# Patient Record
Sex: Female | Born: 1935
Health system: Southern US, Community
[De-identification: ages and names within clinical notes are randomized; demographics above are authoritative.]

## PROBLEM LIST (undated history)

## (undated) DIAGNOSIS — E782 Mixed hyperlipidemia: Secondary | ICD-10-CM

## (undated) DIAGNOSIS — I10 Essential (primary) hypertension: Secondary | ICD-10-CM

## (undated) DIAGNOSIS — E7800 Pure hypercholesterolemia, unspecified: Secondary | ICD-10-CM

## (undated) DIAGNOSIS — R011 Cardiac murmur, unspecified: Secondary | ICD-10-CM

## (undated) DIAGNOSIS — T7840XA Allergy, unspecified, initial encounter: Secondary | ICD-10-CM

## (undated) DIAGNOSIS — R002 Palpitations: Secondary | ICD-10-CM

## (undated) DIAGNOSIS — J45909 Unspecified asthma, uncomplicated: Secondary | ICD-10-CM

## (undated) DIAGNOSIS — I4891 Unspecified atrial fibrillation: Secondary | ICD-10-CM

## (undated) HISTORY — DX: Essential (primary) hypertension: I10

## (undated) HISTORY — DX: Mixed hyperlipidemia: E78.2

## (undated) HISTORY — DX: Unspecified atrial fibrillation: I48.91

## (undated) HISTORY — PX: BREAST SURGERY: SHX581

## (undated) HISTORY — DX: Unspecified asthma, uncomplicated: J45.909

## (undated) HISTORY — DX: Cardiac murmur, unspecified: R01.1

## (undated) HISTORY — DX: Allergy, unspecified, initial encounter: T78.40XA

## (undated) HISTORY — DX: Palpitations: R00.2

## (undated) HISTORY — DX: Pure hypercholesterolemia, unspecified: E78.00

---

## 1998-07-15 ENCOUNTER — Ambulatory Visit (HOSPITAL_BASED_OUTPATIENT_CLINIC_OR_DEPARTMENT_OTHER): Admission: RE | Admit: 1998-07-15 | Discharge: 1998-07-15 | Payer: Self-pay | Admitting: Orthopedic Surgery

## 2002-12-28 ENCOUNTER — Inpatient Hospital Stay (HOSPITAL_COMMUNITY): Admission: EM | Admit: 2002-12-28 | Discharge: 2002-12-29 | Payer: Self-pay | Admitting: Emergency Medicine

## 2002-12-30 ENCOUNTER — Emergency Department (HOSPITAL_COMMUNITY): Admission: EM | Admit: 2002-12-30 | Discharge: 2002-12-30 | Payer: Self-pay | Admitting: Emergency Medicine

## 2003-03-23 ENCOUNTER — Emergency Department (HOSPITAL_COMMUNITY): Admission: EM | Admit: 2003-03-23 | Discharge: 2003-03-23 | Payer: Self-pay | Admitting: Emergency Medicine

## 2009-01-15 ENCOUNTER — Ambulatory Visit (HOSPITAL_BASED_OUTPATIENT_CLINIC_OR_DEPARTMENT_OTHER): Admission: RE | Admit: 2009-01-15 | Discharge: 2009-01-15 | Payer: Self-pay | Admitting: Surgery

## 2010-07-10 NOTE — Discharge Summary (Signed)
NAME:  Melissa Randolph, Melissa Randolph NO.:  [id number]   MEDICAL RECORD NO.:  [id number]                   PATIENT TYPE:  INP   LOCATION:  0340                                 FACILITY:  [facility]   PHYSICIAN:  Madaline Savage, M.D.             DATE OF BIRTH:  08/17/1934   DATE OF ADMISSION:  12/28/2002  DATE OF DISCHARGE:  12/29/2002                                 DISCHARGE SUMMARY   ADMISSION DIAGNOSES:  Atrial fibrillation, 4.27.35   CONDITION ON DISCHARGE:  Improved.   DISCHARGE MEDICATIONS:  The patient will continue her outpatient medications  which include:  1. Proventil metered dose inhaler 2 mg p.o. twice a day.  2. Flovent metered dose inhaler 88 mcg twice a day p.r.n.  3. Allegra unknown dose.  4. Aspirin 81 mg a day.  5. We are adding to that regimen at discharge potassium 10 mEq p.o. daily.   DISPOSITION:  The patient will be following up with Dr. Aggie Cosier and will  call his office this upcoming week for an appointment. Pending at the time  of this discharge is the patient's TSH level which was drawn on admission  and not available at the time of discharge.   HISTORY OF PRESENT ILLNESS:  The patient is a delightful 76 year old white  married female patient of Dr. Lucas Mallow who has no history of any cardiac  disease in the past. She did have an echocardiogram because of having been  told of a heart murmur but she thinks it was basically negative.   The patient developed rapid heart action at 9 p.m. on the evening of  admission that occurred while she was seated and reading a book. She did  have chest pain with it, shortness of breath, dizziness or near syncope. She  did decide to come to the [facility] Emergency Room because she lives  nearby. She was found to be in rapid atrial fibrillation at that time and  was seen by Dr. Sampson Goon and admitted.   PAST MEDICAL HISTORY:  Shows the patient has a history of allergy and may or  may not have  asthma. She has a history of PVC's, she has a deviated septum  and has seen an ear, nose throat specialist in the past. She has had a left  breast fibroid and a remote tonsillectomy, adenoidectomy. She has  intolerance to statin drugs which causes weakness, Zithromax causes  laryngeal edema, contrast dye causes squeezing and requires adrenaline  administration.   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 Rigoberto Noel attends that church. The patient is without  tobacco or alcohol use.   FAMILY HISTORY:  Positive for prostate cancer.   REVIEW OF SYMPTOMS:  Essentially negative other than stated above.   PHYSICAL EXAMINATION ON DISCHARGE:  VITAL SIGNS:  Temperature was 98.0,  pulse 72, respirations  20, blood pressure 149/83.  GENERAL:  The patient is small framed and thin.  HEENT:  She has no carotid bruits or thyromegaly. No adenopathy.  LUNGS:  Entirely clear.  CARDIAC:  I heard a soft systolic murmur at the lower left sternal edge  without gallops. There were do diastolic murmurs.   Although I did not examine the following systems, Dr. Domingo Sep reports from  the previous evening that the abdomen was soft and bowel sounds were present  and there was no peripheral edema with 2+ pedal pulses.   EKG on November 6 at 4:01 a.m. showed sinus rhythm at a rate of 65/minute  with a slightly rightward axis and was otherwise unremarkable.   LABORATORY DATA:  Laboratory data during hospitalization showed the initial  potassium to be 3.0 and after potassium supplementation it was 3.7.  Hemoglobin 14.9, hematocrit 44%, platelet count 309,000, white blood cell  count 8800, normal differential. The patient's TSH at the time of this  dictation is still pending. CK isoenzymes showed CK of 122, MB of 2.4,  relative index 2.0 and troponin 0.01. Second set was 1.152 CK, MB of 3.2,  relative index 2.1 and troponin 0.01.   HOSPITAL COURSE:  The  patient was admitted to telemetry and placed heparin.  The patient slept well and had no complaints. Some time shortly after she  arrived either in the emergency room or later in the evening, she went into  sinus rhythm and has remained free of any further palpitations. The  telemetry since midnight has shown normal sinus rhythm.   Because of the patient's recent echo which was allegedly normal, her  negative cardiac enzymes and her current use of aspirin, we will discharge  her at this time to followup with Dr. Lucas Mallow in the office early next week.  Her TSH will need to be checked, a repeat potassium will be checked and we  did give her 10 mEq of potassium chloride to take at discharge. The patient  made me aware that she had read reports of low potassium with the use of  Proventil and Epocrates does list hypokalemia as a side effect of Proventil  use, so 10 mEq of potassium was given on discharge.                                               Madaline Savage, M.D.    WHG/MEDQ  D:  12/29/2002  T:  12/29/2002  Job:  [id number]   cc:   Jaclyn Prime. Lucas Mallow, M.D.  [address]  [place]  [place] [address]  Fax: [phone]   T. Huston Foley  [address] [place]  [place] [address]  Fax: [phone]

## 2010-07-14 ENCOUNTER — Ambulatory Visit (HOSPITAL_BASED_OUTPATIENT_CLINIC_OR_DEPARTMENT_OTHER)
Admission: RE | Admit: 2010-07-14 | Discharge: 2010-07-14 | Disposition: A | Discharge status: Home / Self Care | Payer: Medicare Other | Source: Ambulatory Visit | Attending: Orthopedic Surgery | Admitting: Orthopedic Surgery

## 2010-07-14 DIAGNOSIS — M65849 Other synovitis and tenosynovitis, unspecified hand: Secondary | ICD-10-CM | POA: Insufficient documentation

## 2010-07-14 DIAGNOSIS — M65839 Other synovitis and tenosynovitis, unspecified forearm: Secondary | ICD-10-CM | POA: Insufficient documentation

## 2010-07-17 NOTE — Op Note (Signed)
  NAME:  BROOKELLE, PELLICANE NO.:  [id number]  MEDICAL RECORD NO.:  [id number]           PATIENT TYPE:  LOCATION:                                 FACILITY:  PHYSICIAN:  Katy Fitch. Oluwaferanmi Wain, M.D.      DATE OF BIRTH:  DATE OF PROCEDURE:  07/14/2010 DATE OF DISCHARGE:                              OPERATIVE REPORT   PREOPERATIVE DIAGNOSIS:  Locking stenosing tenosynovitis, right thumb at A1 pulley failing nonoperative measures.  POSTOPERATIVE DIAGNOSIS:  Locking stenosing tenosynovitis, right thumb at A1 pulley failing nonoperative measures.  OPERATION:  Release of right thumb A1 pulley.  OPERATING SURGEON:  Katy Fitch. Gwenetta Devos, MD  ASSISTANT:  Marveen Reeks Dasnoit, PA-C  ANESTHESIA:  2% lidocaine, metacarpal head level block.  This was performed as a minor operating room procedure.  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  She is status post treatment of a similar predicament of left thumb years back.  She has failed nonoperative measures including splinting, activity modification, and injection.  Due to failure to response to nonoperative measures, she is brought to the operating room at this time for release of her right thumb A1 pulley.  Preoperatively, Ms. Graciano reported that she did not want postoperative pain medication.  She will use over-the-counter analgesics postoperatively.  PROCEDURE IN DETAIL:  Jaiden Wahab was brought to room 8 of the [facility] and placed in supine position upon the operating table. Following Betadine prep of her right hand, 2% lidocaine was infiltrated at the metacarpal head level to obtain a digital block.  After few moments, excellent anesthesia was achieved.  Procedure commenced with Betadine scrub and paint of the right arm followed by placement of  impervious sterile towels.  After informed consent and a routine surgical time-out, the arm was exsanguinated by direct compression and an arterial tourniquet on the proximal brachium inflated to 220 mmHg.  A short transverse incision was fashioned directly over the palpably thickened A1 pulley.  Subcutaneous tissues were carefully divided taking care to identify the radial proper digital nerve which was gently retracted with a Ragnell retractor.  The A1 pulley was thickened to a walled thickness of approximately 2.5 mm.  This was incised with scalpel and released with scissors.  Thereafter, free range of motion of the IP joint was recovered.  The tendons were delivered and found to have a small swelling/nodule which should resolve over time.  The wound was then repaired with trauma suture of 5-0 nylon x2.  A compressive dressing was applied with Xeroflo sterile gauze and Ace wrap.  Ms. Jolin was advised to remove her Ace bandage in 48 hours and to begin using Band-Aids.  Short-term followup in 8 days for suture removal.   Katy Fitch. Sylvi Rybolt, M.D.     RVS/MEDQ  D:  07/14/2010  T:  07/15/2010  Job:  [id number]  Electronically Signed by Josephine Igo M.D. on 07/17/2010 08:14:39 AM

## 2012-02-21 ENCOUNTER — Other Ambulatory Visit: Payer: Self-pay | Admitting: Dermatology

## 2012-11-29 ENCOUNTER — Other Ambulatory Visit: Payer: Self-pay | Admitting: Interventional Cardiology

## 2012-11-29 ENCOUNTER — Telehealth: Payer: Self-pay | Admitting: Interventional Cardiology

## 2012-11-29 NOTE — Telephone Encounter (Signed)
New problem:  Pt states she wants to know if she can have more samples of Crestor 10 mg

## 2012-11-30 NOTE — Telephone Encounter (Signed)
We are out of samples and pt will call back next week to check.

## 2012-12-06 ENCOUNTER — Encounter: Payer: Self-pay | Admitting: Interventional Cardiology

## 2012-12-06 ENCOUNTER — Encounter: Payer: Self-pay | Admitting: *Deleted

## 2012-12-07 ENCOUNTER — Telehealth: Payer: Self-pay | Admitting: *Deleted

## 2012-12-07 NOTE — Telephone Encounter (Signed)
Samples left at front desk for pick up. Patient aware.

## 2012-12-07 NOTE — Telephone Encounter (Signed)
Yes, if we have them please supply pt with samples.

## 2012-12-11 ENCOUNTER — Ambulatory Visit (INDEPENDENT_AMBULATORY_CARE_PROVIDER_SITE_OTHER): Payer: Medicare Other | Admitting: Interventional Cardiology

## 2012-12-11 ENCOUNTER — Encounter: Payer: Self-pay | Admitting: Interventional Cardiology

## 2012-12-11 VITALS — BP 158/56 | HR 68 | Ht 64.5 in | Wt 120.0 lb

## 2012-12-11 DIAGNOSIS — E782 Mixed hyperlipidemia: Secondary | ICD-10-CM | POA: Insufficient documentation

## 2012-12-11 DIAGNOSIS — I10 Essential (primary) hypertension: Secondary | ICD-10-CM | POA: Insufficient documentation

## 2012-12-11 DIAGNOSIS — I4891 Unspecified atrial fibrillation: Secondary | ICD-10-CM

## 2012-12-11 DIAGNOSIS — I4589 Other specified conduction disorders: Secondary | ICD-10-CM

## 2012-12-11 MED ORDER — DILTIAZEM HCL ER COATED BEADS 180 MG PO CP24: 180.0000 mg | ORAL_CAPSULE | Freq: Every day | ORAL | Status: DC

## 2012-12-11 MED ORDER — DIGOXIN 250 MCG PO TABS: 0.2500 mg | ORAL_TABLET | Freq: Every day | ORAL | Status: DC

## 2012-12-11 NOTE — Progress Notes (Signed)
Patient ID: Melissa Randolph, female   DOB: [birth year], 78 y.o.   MRN: [id number]    [address] [place], [place]  [address] Phone: [phone] Fax:  [phone]  Date:  12/11/2012   ID:  Melissa Randolph, DOB February 24, 1934, MRN [id number]  PCP:  No primary provider on file.      History of Present Illness: Melissa Randolph is a 78 y.o. female who has had multiple atrial arrhythmias including atrial fibrillation. Sx have ben controlled on [place]. She has had some palpitations during stressful days, while wearing monitor, as she was not taking th eusual extra diltiazem. She had AFib 3% of the time. When She premedicated with some extra diltiazem before the stress which was related to performing in church, she avoided the AFib sx. Atrial Fibrillation F/U:  Denies : Chest pain.  Dizziness.  Leg edema.  Orthopnea.  Palpitations.  Shortness of breath.  Syncope.  She did aerobic and strength training, yoga classes.    Wt Readings from Last 3 Encounters:  12/11/12 120 lb (54.432 kg)     Past Medical History  Diagnosis Date  . Hypercholesteremia   . HTN (hypertension)   . Atrial fibrillation   . Mixed hyperlipidemia   . Palpitation     Current Outpatient Prescriptions  Medication Sig Dispense Refill  . Ascorbic Acid (VITAMIN C PO) Take 1 tablet by mouth daily.      Marland Kitchen aspirin 81 MG tablet Take 81 mg by mouth daily.      . calcium carbonate (OS-CAL - DOSED IN MG OF ELEMENTAL CALCIUM) 1250 MG tablet Take 1 tablet by mouth.      . CARTIA XT 180 MG 24 hr capsule TAKE 1 CAPSULE BY MOUTH TWICE DAILY  60 capsule  0  . cholecalciferol (VITAMIN D) 400 UNITS TABS tablet Take 1,000 Units by mouth.      . co-enzyme Q-10 30 MG capsule Take 30 mg by mouth 3 (three) times daily.      . digoxin (LANOXIN) 0.25 MG tablet Take 0.25 mg by mouth daily.      Marland Kitchen diltiazem (CARDIZEM) 60 MG tablet Take 60 mg by mouth 4 (four) times daily.      . Fish Oil OIL 480 mg by Does not apply  route.      . fluticasone (FLONASE) 50 MCG/ACT nasal spray Place 2 sprays into the nose daily.      . Potassium Gluconate 550 MG TABS Take 1 tablet by mouth daily.      Marland Kitchen azelastine (ASTELIN) 137 MCG/SPRAY nasal spray       . fluticasone (FLOVENT HFA) 44 MCG/ACT inhaler Inhale 1 puff into the lungs 2 (two) times daily.      Marland Kitchen FLUVIRIN INJ injection        No current facility-administered medications for this visit.    Allergies:    Allergies  Allergen Reactions  . Ivp Dye [Iodinated Diagnostic Agents]   . Polycin [Bacitracin-Polymyxin B]   . Zithromax [Azithromycin]     Social History:  The patient  reports that she has never smoked. She does not have any smokeless tobacco history on file.   Family History:  The patient's family history is not on file.   ROS:  Please see the history of present illness.  No nausea, vomiting.  No fevers, chills.  No focal weakness.  No dysuria. Managing her sx from PACs.  All other systems reviewed and negative.   PHYSICAL  EXAM: VS:  BP 158/56  Pulse 68  Ht 5' 4.5" (1.638 m)  Wt 120 lb (54.432 kg)  BMI 20.29 kg/m2  SpO2 99% Well nourished, well developed, in no acute distress HEENT: normal Neck: no JVD, no carotid bruits Cardiac:  normal S1, S2; RRR; 2/6 early systolic murmur Lungs:  clear to auscultation bilaterally, no wheezing, rhonchi or rales Abd: soft, nontender, no hepatomegaly Ext: no edema Skin: warm and dry Neuro:   no focal abnormalities noted  EKG:  NSR, IRBBB, NSST    ASSESSMENT AND PLAN:  Atrial fibrillation  Continue Aspirin 162 mg,  orally, twice daily Continue Cardizem CD Capsule Extended Release 24 Hour, 180 MG, 1 capsule, Orally, twice a day, 60, Refills 11 Continue Lanoxin Tablet, 0.25 MG, 1 tablet, Orally, Once a day, 30, Refills 11 LAB: Basic Metabolic (Ordered for 12/06/2011)   GLUCOSE 85 70-99 - mg/dL   BUN 14 1-61 - mg/dL   CREATININE 0.96 0.45-4.09 - mg/dl   eGFR (NON-AFRICAN AMERICAN) 69 >60 - calc   eGFR  (AFRICAN AMERICAN) 83 >60 - calc   SODIUM 143 136-145 - mmol/L   POTASSIUM 4.1 3.5-5.5 - mmol/L   CHLORIDE 106 98-107 - mmol/L   C02 30 22-32 - mmol/L   ANION GAP 10.7 6.0-20.0 - mmol/L   CALCIUM 9.5 8.6-10.3 - mg/dL    Kayliegh Boyers,JAY 81/19/1478 03:59:50 PM > normal Harward,Amy 12/08/2011 04:02:35 PM > lmom per dpr.   Currently NSR.  AFib 3% of the time by monitor in 2013. we again discussed anticoagulation with Coumadin. She feels that with her active lifestyle,her risk of bleeding is greater then her risk of stroke. She will continue to self medicate with diltiazem to avoid the episodic atrial fibrillation.  Check labs when she is fasting.  BP ontrolled aoutside of MDs office.   2. Mixed hyperlipidemia  Continue Crestor Tablet, 10 MG, 1 tablet, Orally, three times a week LAB: Statin Panel (Ordered for 12/06/2011)   ALP 64 38-126 - U/L   ALT 18 0-52 - U/L   AST 19 0-39 - U/L   CHOLESTEROL 183 <200 - mg/dL   TRIG 295 6-213 - mg/dL   DLDL 086 5-78 - mg/dL H  NON-HDL 469 6-295 - mg/dL   DIRECT HDL 54 28-41 - mg/dL   CHOL/HDL 3.4 3.2-4.4 - Ratio    Sarah Baez,JAY 12/08/2011 04:00:13 PM > lipids well controlled. Harward,Amy 12/08/2011 04:02:55 PM > lmom per dpr.   Lipids much better. Using CoQ10 to help with Crestor. LDL 82.    3. Palpitations  Continue Diltiazem HCl Tablet, 60 MG, 1 tablet, Orally, as needed for palpitations, 15, Refills 11 Infrequent. Use aditional diltiazem as needed.    Follow Up  1 Year (Reason: paroxysmal atrial fibrillation)     Signed, Fredric Mare, MD, [facility] 12/11/2012 9:22 AM

## 2012-12-11 NOTE — Patient Instructions (Addendum)
Your physician wants you to follow-up in: 1 year with Dr. Eldridge Dace. You will receive a reminder letter in the mail two months in advance. If you don't receive a letter, please call our office to schedule the follow-up appointment.  Your physician recommends that you continue on your current medications as directed. Please refer to the Current Medication list given to you today.  Your physician recommends that you return for lab work on 12-13-12 fasting for Bmet, ALT and Lipid.  Your Cartia XT and digoxin have been refilled.

## 2012-12-13 ENCOUNTER — Other Ambulatory Visit: Payer: Self-pay

## 2012-12-13 ENCOUNTER — Other Ambulatory Visit (INDEPENDENT_AMBULATORY_CARE_PROVIDER_SITE_OTHER): Payer: Medicare Other

## 2012-12-13 DIAGNOSIS — E782 Mixed hyperlipidemia: Secondary | ICD-10-CM

## 2012-12-13 LAB — LIPID PANEL
Cholesterol: 189 mg/dL (ref 0–200)
HDL: 66.8 mg/dL (ref >39.00)
LDL Cholesterol: 104 mg/dL — ABNORMAL HIGH (ref 0–99)
Total CHOL/HDL Ratio: 3
Triglycerides: 92 mg/dL (ref 0.0–149.0)
VLDL: 18.4 mg/dL (ref 0.0–40.0)

## 2012-12-13 LAB — BASIC METABOLIC PANEL
BUN: 13 mg/dL (ref 6–23)
CO2: 30 mEq/L (ref 19–32)
Calcium: 9.2 mg/dL (ref 8.4–10.5)
Chloride: 105 mEq/L (ref 96–112)
Creatinine, Ser: 0.8 mg/dL (ref 0.4–1.2)
GFR: 77.48 mL/min (ref >60.00)
Glucose, Bld: 94 mg/dL (ref 70–99)
Potassium: 3.9 mEq/L (ref 3.5–5.1)
Sodium: 142 mEq/L (ref 135–145)

## 2012-12-13 LAB — ALT: ALT: 22 U/L (ref 0–35)

## 2012-12-13 MED ORDER — DILTIAZEM HCL ER COATED BEADS 180 MG PO CP24: 180.0000 mg | ORAL_CAPSULE | Freq: Two times a day (BID) | ORAL | Status: DC

## 2012-12-14 ENCOUNTER — Encounter: Payer: Self-pay | Admitting: Cardiology

## 2012-12-15 ENCOUNTER — Telehealth: Payer: Self-pay | Admitting: Interventional Cardiology

## 2012-12-15 NOTE — Telephone Encounter (Signed)
New problem     Pt needs a call back about her lab results and she may need a copy   Thanks!

## 2012-12-15 NOTE — Telephone Encounter (Signed)
Pt notified I mailed all labwork to her yesterday.

## 2013-02-08 ENCOUNTER — Telehealth: Payer: Self-pay | Admitting: *Deleted

## 2013-02-08 NOTE — Telephone Encounter (Signed)
3 week samples crestor 10 mg approved by Gaynell Face

## 2013-03-30 ENCOUNTER — Telehealth: Payer: Self-pay

## 2013-03-30 NOTE — Telephone Encounter (Signed)
Patient called wanting samples of crestor 10 mg placed samples up front

## 2013-04-25 ENCOUNTER — Other Ambulatory Visit: Payer: Self-pay | Admitting: Interventional Cardiology

## 2013-06-30 ENCOUNTER — Ambulatory Visit (INDEPENDENT_AMBULATORY_CARE_PROVIDER_SITE_OTHER): Payer: Medicare Other | Admitting: Internal Medicine

## 2013-06-30 VITALS — BP 130/70 | HR 75 | Temp 98.8°F | Resp 12 | Ht 64.0 in | Wt 121.0 lb

## 2013-06-30 DIAGNOSIS — J40 Bronchitis, not specified as acute or chronic: Secondary | ICD-10-CM

## 2013-06-30 DIAGNOSIS — J9801 Acute bronchospasm: Secondary | ICD-10-CM

## 2013-06-30 MED ORDER — DOXYCYCLINE HYCLATE 100 MG PO TABS: 100.0000 mg | ORAL_TABLET | Freq: Two times a day (BID) | ORAL | Status: DC

## 2013-06-30 MED ORDER — PREDNISONE 10 MG PO TABS: ORAL_TABLET | ORAL | Status: DC

## 2013-06-30 NOTE — Patient Instructions (Signed)
Bronchitis Bronchitis is inflammation of the airways that extend from the windpipe into the lungs (bronchi). The inflammation often causes mucus to develop, which leads to a cough. If the inflammation becomes severe, it may cause shortness of breath. CAUSES  Bronchitis may be caused by:   Viral infections.   Bacteria.   Cigarette smoke.   Allergens, pollutants, and other irritants.  SIGNS AND SYMPTOMS  The most common symptom of bronchitis is a frequent cough that produces mucus. Other symptoms include:  Fever.   Body aches.   Chest congestion.   Chills.   Shortness of breath.   Sore throat.  DIAGNOSIS  Bronchitis is usually diagnosed through a medical history and physical exam. Tests, such as chest X-rays, are sometimes done to rule out other conditions.  TREATMENT  You may need to avoid contact with whatever caused the problem (smoking, for example). Medicines are sometimes needed. These may include:  Antibiotics. These may be prescribed if the condition is caused by bacteria.  Cough suppressants. These may be prescribed for relief of cough symptoms.   Inhaled medicines. These may be prescribed to help open your airways and make it easier for you to breathe.   Steroid medicines. These may be prescribed for those with recurrent (chronic) bronchitis. HOME CARE INSTRUCTIONS  Get plenty of rest.   Drink enough fluids to keep your urine clear or pale yellow (unless you have a medical condition that requires fluid restriction). Increasing fluids may help thin your secretions and will prevent dehydration.   Only take over-the-counter or prescription medicines as directed by your health care provider.  Only take antibiotics as directed. Make sure you finish them even if you start to feel better.  Avoid secondhand smoke, irritating chemicals, and strong fumes. These will make bronchitis worse. If you are a smoker, quit smoking. Consider using nicotine gum or  skin patches to help control withdrawal symptoms. Quitting smoking will help your lungs heal faster.   Put a cool-mist humidifier in your bedroom at night to moisten the air. This may help loosen mucus. Change the water in the humidifier daily. You can also run the hot water in your shower and sit in the bathroom with the door closed for 5 10 minutes.   Follow up with your health care provider as directed.   Wash your hands frequently to avoid catching bronchitis again or spreading an infection to others.  SEEK MEDICAL CARE IF: Your symptoms do not improve after 1 week of treatment.  SEEK IMMEDIATE MEDICAL CARE IF:  Your fever increases.  You have chills.   You have chest pain.   You have worsening shortness of breath.   You have bloody sputum.  You faint.  You have lightheadedness.  You have a severe headache.   You vomit repeatedly. MAKE SURE YOU:   Understand these instructions.  Will watch your condition.  Will get help right away if you are not doing well or get worse. Document Released: 02/08/2005 Document Revised: 11/29/2012 Document Reviewed: 10/03/2012 [facility] Patient Information 2014 [place]. Bronchospasm, Adult A bronchospasm is a spasm or tightening of the airways going into the lungs. During a bronchospasm breathing becomes more difficult because the airways get smaller. When this happens there can be coughing, a whistling sound when breathing (wheezing), and difficulty breathing. Bronchospasm is often associated with asthma, but not all patients who experience a bronchospasm have asthma. CAUSES  A bronchospasm is caused by inflammation or irritation of the airways. The inflammation or irritation  may be triggered by:   Allergies (such as to animals, pollen, food, or mold). Allergens that cause bronchospasm may cause wheezing immediately after exposure or many hours later.   Infection. Viral infections are believed to be the most common  cause of bronchospasm.   Exercise.   Irritants (such as pollution, cigarette smoke, strong odors, aerosol sprays, and paint fumes).   Weather changes. Winds increase molds and pollens in the air. Rain refreshes the air by washing irritants out. Cold air may cause inflammation.   Stress and emotional upset.  SIGNS AND SYMPTOMS   Wheezing.   Excessive nighttime coughing.   Frequent or severe coughing with a simple cold.   Chest tightness.   Shortness of breath.  DIAGNOSIS  Bronchospasm is usually diagnosed through a history and physical exam. Tests, such as chest X-rays, are sometimes done to look for other conditions. TREATMENT   Inhaled medicines can be given to open up your airways and help you breathe. The medicines can be given using either an inhaler or a nebulizer machine.  Corticosteroid medicines may be given for severe bronchospasm, usually when it is associated with asthma. HOME CARE INSTRUCTIONS   Always have a plan prepared for seeking medical care. Know when to call your health care provider and local emergency services (911 in the [place] Know where you can access local emergency care.  Only take medicines as directed by your health care provider.  If you were prescribed an inhaler or nebulizer machine, ask your health care provider to explain how to use it correctly. Always use a spacer with your inhaler if you were given one.  It is necessary to remain calm during an attack. Try to relax and breathe more slowly.  Control your home environment in the following ways:   Change your heating and air conditioning filter at least once a month.   Limit your use of fireplaces and wood stoves.  Do not smoke and do not allow smoking in your home.   Avoid exposure to perfumes and fragrances.   Get rid of pests (such as roaches and mice) and their droppings.   Throw away plants if you see mold on them.   Keep your house clean and dust free.    Replace carpet with wood, tile, or vinyl flooring. Carpet can trap dander and dust.   Use allergy-proof pillows, mattress covers, and box spring covers.   Wash bed sheets and blankets every week in hot water and dry them in a dryer.   Use blankets that are made of polyester or cotton.   Wash hands frequently. SEEK MEDICAL CARE IF:   You have muscle aches.   You have chest pain.   The sputum changes from clear or white to yellow, green, gray, or bloody.   The sputum you cough up gets thicker.   There are problems that may be related to the medicine you are given, such as a rash, itching, swelling, or trouble breathing.  SEEK IMMEDIATE MEDICAL CARE IF:   You have worsening wheezing and coughing even after taking your prescribed medicines.   You have increased difficulty breathing.   You develop severe chest pain. MAKE SURE YOU:   Understand these instructions.  Will watch your condition.  Will get help right away if you are not doing well or get worse. Document Released: 02/11/2003 Document Revised: 10/11/2012 Document Reviewed: 07/31/2012 [facility] Patient Information 2014 [place].

## 2013-06-30 NOTE — Progress Notes (Signed)
   Subjective:    Patient ID: Melissa Randolph, female    DOB: 02-15-1935, 79 y.o.   MRN: [id number]  HPI 79 year old female complains of nasal yellow discharge, cough, congestion and low grade fevers. Has been sick for 3 days.   Has tried robutussion  and it has loosened up her mucus but she is still unable to cough anything up. Had a little wheezing last night but when coughing it helped clear Cough and chest congestion the worst part. No sob, no cp. Prednisone works well for her  Review of Systems     Objective:   Physical Exam  Vitals reviewed. Constitutional: She is oriented to person, place, and time. She appears well-developed and well-nourished. No distress.  HENT:  Head: Normocephalic.  Right Ear: External ear normal.  Left Ear: External ear normal.  Nose: Mucosal edema and rhinorrhea present.  Mouth/Throat: Oropharynx is clear and moist.  Eyes: EOM are normal. Pupils are equal, round, and reactive to light.  Neck: Normal range of motion. Neck supple. No tracheal deviation present.  Cardiovascular: Normal rate and normal heart sounds.   Pulmonary/Chest: Effort normal. Not tachypneic. No respiratory distress. She has decreased breath sounds. She has wheezes. She has rhonchi. She has no rales. She exhibits no tenderness.  Musculoskeletal: Normal range of motion.  Lymphadenopathy:    She has no cervical adenopathy.  Neurological: She is alert and oriented to person, place, and time. She exhibits normal muscle tone. Coordination normal.  Psychiatric: She has a normal mood and affect. Her behavior is normal.          Assessment & Plan:  Bronchitis Doxycycline/Prednisone

## 2013-07-09 ENCOUNTER — Other Ambulatory Visit: Payer: Self-pay | Admitting: Interventional Cardiology

## 2013-07-10 NOTE — Telephone Encounter (Signed)
Does the patient take this prn as requested? Please advise. Thanks, MI

## 2013-08-10 ENCOUNTER — Other Ambulatory Visit: Payer: Self-pay

## 2013-08-10 MED ORDER — ROSUVASTATIN CALCIUM 10 MG PO TABS: 10.0000 mg | ORAL_TABLET | ORAL | Status: DC

## 2013-08-14 ENCOUNTER — Telehealth: Payer: Self-pay

## 2013-08-14 NOTE — Telephone Encounter (Signed)
Patient called for samples of crestor placed them up front

## 2013-10-17 ENCOUNTER — Telehealth: Payer: Self-pay | Admitting: *Deleted

## 2013-10-17 NOTE — Telephone Encounter (Signed)
Crestor samples placed at the front desk for pick up. 

## 2013-12-04 ENCOUNTER — Telehealth: Payer: Self-pay

## 2013-12-04 NOTE — Telephone Encounter (Signed)
Patient called wanting samples of crestor 10mg  placed a month supply up front

## 2013-12-13 ENCOUNTER — Other Ambulatory Visit (INDEPENDENT_AMBULATORY_CARE_PROVIDER_SITE_OTHER): Payer: Medicare Other | Admitting: *Deleted

## 2013-12-13 DIAGNOSIS — I10 Essential (primary) hypertension: Secondary | ICD-10-CM

## 2013-12-13 DIAGNOSIS — E782 Mixed hyperlipidemia: Secondary | ICD-10-CM

## 2013-12-13 LAB — LIPID PANEL
Cholesterol: 199 mg/dL (ref 0–200)
HDL: 59.8 mg/dL (ref >39.00)
LDL Cholesterol: 106 mg/dL — ABNORMAL HIGH (ref 0–99)
NonHDL: 139.2
Total CHOL/HDL Ratio: 3
Triglycerides: 167 mg/dL — ABNORMAL HIGH (ref 0.0–149.0)
VLDL: 33.4 mg/dL (ref 0.0–40.0)

## 2013-12-13 LAB — ALT: ALT: 21 U/L (ref 0–35)

## 2013-12-14 ENCOUNTER — Other Ambulatory Visit: Payer: Self-pay

## 2013-12-14 DIAGNOSIS — Z79899 Other long term (current) drug therapy: Secondary | ICD-10-CM

## 2013-12-14 DIAGNOSIS — E785 Hyperlipidemia, unspecified: Secondary | ICD-10-CM

## 2013-12-19 ENCOUNTER — Ambulatory Visit (INDEPENDENT_AMBULATORY_CARE_PROVIDER_SITE_OTHER): Payer: Medicare Other | Admitting: Interventional Cardiology

## 2013-12-19 ENCOUNTER — Encounter: Payer: Self-pay | Admitting: Interventional Cardiology

## 2013-12-19 VITALS — BP 148/70 | HR 60 | Ht 64.5 in | Wt 120.1 lb

## 2013-12-19 DIAGNOSIS — E782 Mixed hyperlipidemia: Secondary | ICD-10-CM

## 2013-12-19 DIAGNOSIS — I480 Paroxysmal atrial fibrillation: Secondary | ICD-10-CM

## 2013-12-19 DIAGNOSIS — I10 Essential (primary) hypertension: Secondary | ICD-10-CM

## 2013-12-19 DIAGNOSIS — R002 Palpitations: Secondary | ICD-10-CM

## 2013-12-19 LAB — BASIC METABOLIC PANEL
BUN: 15 mg/dL (ref 6–23)
CO2: 27 mEq/L (ref 19–32)
Calcium: 9.5 mg/dL (ref 8.4–10.5)
Chloride: 104 mEq/L (ref 96–112)
Creatinine, Ser: 0.8 mg/dL (ref 0.4–1.2)
GFR: 69.89 mL/min (ref >60.00)
Glucose, Bld: 94 mg/dL (ref 70–99)
Potassium: 3.8 mEq/L (ref 3.5–5.1)
Sodium: 141 mEq/L (ref 135–145)

## 2013-12-19 MED ORDER — DILTIAZEM HCL 60 MG PO TABS: ORAL_TABLET | ORAL | Status: DC

## 2013-12-19 MED ORDER — DILTIAZEM HCL ER COATED BEADS 180 MG PO CP24: 180.0000 mg | ORAL_CAPSULE | Freq: Two times a day (BID) | ORAL | Status: DC

## 2013-12-19 MED ORDER — DIGOXIN 250 MCG PO TABS: 0.2500 mg | ORAL_TABLET | Freq: Every day | ORAL | Status: DC

## 2013-12-19 NOTE — Patient Instructions (Signed)
Your physician recommends that you have lab work today: digoxin level/ BMP  Your physician recommends that you continue on your current medications as directed. Please refer to the Current Medication list given to you today.  Your physician wants you to follow-up in: 1 year with Dr. Irish Lack. You will receive a reminder letter in the mail two months in advance. If you don't receive a letter, please call our office to schedule the follow-up appointment.

## 2013-12-19 NOTE — Progress Notes (Signed)
Patient ID: Melissa Randolph, female   DOB: Sep 22, 1934, 79 y.o.   MRN: [id number] Patient ID: Melissa Randolph, female   DOB: [birth year], 79 y.o.   MRN: [id number]    [place] [place], [place]  [address] Phone: [phone] Fax:  [phone]  Date:  12/19/2013   ID:  Melissa Randolph, DOB 02-Jun-1934, MRN [id number]  PCP:  Jani Gravel, MD      History of Present Illness: Melissa Randolph is a 79 y.o. female who has had multiple atrial arrhythmias including atrial fibrillation. Sx have ben controlled on [place]. She has had some palpitations during stressful days, while wearing monitor, as she was not taking th eusual extra diltiazem. She had AFib 3% of the time. When She premedicated with some extra diltiazem before the stress which was related to performing in church, she avoided the AFib sx. Atrial Fibrillation F/U:  Denies : Chest pain.  Dizziness.  Leg edema.  Orthopnea.  Palpitations.  Shortness of breath.  Syncope.  She did aerobic and strength training, yoga classes.  She has only had one episode of plapitations in the last 6 months.  It was gone within an hour of taking an extra 60 mg diltiazem.  Home BP readings are in the 120s /80s range.   Wt Readings from Last 3 Encounters:  12/19/13 120 lb 1.9 oz (54.486 kg)  06/30/13 121 lb (54.885 kg)  12/11/12 120 lb (54.432 kg)     Past Medical History  Diagnosis Date  . Hypercholesteremia   . HTN (hypertension)   . Atrial fibrillation   . Mixed hyperlipidemia   . Palpitation   . Allergy   . Asthma   . Heart murmur     Current Outpatient Prescriptions  Medication Sig Dispense Refill  . Ascorbic Acid (VITAMIN C PO) Take 1 tablet by mouth daily. 400 mg      . aspirin 81 MG tablet Take 162 mg by mouth 2 (two) times daily.       Marland Kitchen azelastine (ASTELIN) 137 MCG/SPRAY nasal spray       . calcium carbonate (OS-CAL - DOSED IN MG OF ELEMENTAL CALCIUM) 1250 MG tablet Take 1 tablet by mouth. $RemoveB'600mg'Qcuhbycm$  daily        . cholecalciferol (VITAMIN D) 400 UNITS TABS tablet Take 1,000 Units by mouth.      . co-enzyme Q-10 30 MG capsule Take 200 mg by mouth at bedtime. 100 mg tablets      . digoxin (LANOXIN) 0.25 MG tablet Take 1 tablet (0.25 mg total) by mouth daily.  90 tablet  3  . diltiazem (CARDIZEM) 60 MG tablet TAKE TABLET BY MOUTH EVERY DAY AS NEEDED  15 tablet  3  . diltiazem (CARTIA XT) 180 MG 24 hr capsule Take 1 capsule (180 mg total) by mouth 2 (two) times daily.  180 capsule  3  . Fish Oil OIL 480 mg by Does not apply route.      . fluticasone (FLONASE) 50 MCG/ACT nasal spray Place 2 sprays into the nose daily.      . fluticasone (FLOVENT HFA) 44 MCG/ACT inhaler Inhale 1 puff into the lungs 2 (two) times daily.      Marland Kitchen FLUVIRIN INJ injection       . Potassium Gluconate 550 MG TABS Take 1 tablet by mouth daily.      . predniSONE (DELTASONE) 10 MG tablet 6-5-4-3-2-1 po pc for breathing  21 tablet  0  . rosuvastatin (  CRESTOR) 10 MG tablet Take 1 tablet (10 mg total) by mouth 3 (three) times a week. Pt takes Monday, Wednesday, Friday only  30 tablet  3   No current facility-administered medications for this visit.    Allergies:    Allergies  Allergen Reactions  . Ivp Dye [Iodinated Diagnostic Agents]   . Polycin [Bacitracin-Polymyxin B]   . Zithromax [Azithromycin]     Social History:  The patient  reports that she has never smoked. She has never used smokeless tobacco.   Family History:  The patient's family history includes Cancer in her father; Hyperlipidemia in her mother.   ROS:  Please see the history of present illness.  No nausea, vomiting.  No fevers, chills.  No focal weakness.  No dysuria. Managing her sx from PACs.  All other systems reviewed and negative.   PHYSICAL EXAM: VS:  BP 148/70  Pulse 60  Ht 5' 4.5" (1.638 m)  Wt 120 lb 1.9 oz (54.486 kg)  BMI 20.31 kg/m2 Well nourished, well developed, in no acute distress HEENT: normal Neck: no JVD, no carotid bruits Cardiac:   normal S1, S2; RRR; 2/6 early systolic murmur Lungs:  clear to auscultation bilaterally, no wheezing, rhonchi or rales Abd: soft, nontender, no hepatomegaly Ext: no edema Skin: warm and dry Neuro:   no focal abnormalities noted  EKG:  NSR, IRBBB, NSST    ASSESSMENT AND PLAN:  Atrial fibrillation  Continue Aspirin 162 mg,  orally, twice daily Continue Cardizem CD Capsule Extended Release 24 Hour, 180 MG, 1 capsule, Orally, twice a day, 60, Refills 11 Continue Lanoxin Tablet, 0.25 MG, 1 tablet, Orally, Once a day, 30, Refills 11 LAB: Basic Metabolic (Ordered for 12/06/2011)   GLUCOSE 85 70-99 - mg/dL   BUN 14 1-88 - mg/dL   CREATININE 8.54 1.82-5.82 - mg/dl   eGFR (NON-AFRICAN AMERICAN) 69 >60 - calc   eGFR (AFRICAN AMERICAN) 83 >60 - calc   SODIUM 143 136-145 - mmol/L   POTASSIUM 4.1 3.5-5.5 - mmol/L   CHLORIDE 106 98-107 - mmol/L   C02 30 22-32 - mmol/L   ANION GAP 10.7 6.0-20.0 - mmol/L   CALCIUM 9.5 8.6-10.3 - mg/dL    Marylene Masek,JAY 71/47/1578 03:59:50 PM > normal Harward,Amy 12/08/2011 04:02:35 PM > lmom per dpr.   Currently NSR.  AFib 3% of the time by monitor in 2013. we again discussed anticoagulation with Coumadin. She feels that with her active lifestyle,her risk of bleeding is greater then her risk of stroke. She will continue to self medicate with diltiazem to avoid the episodic atrial fibrillation.  Check labs when she is fasting.  BP ontrolled aoutside of MDs office.   2. Mixed hyperlipidemia  Continue Crestor Tablet, 10 MG, 1 tablet, Orally, three times a week LAB: Statin Panel (Ordered for 12/06/2011)   ALP 64 38-126 - U/L   ALT 18 0-52 - U/L   AST 19 0-39 - U/L   CHOLESTEROL 183 <200 - mg/dL   TRIG 739 4-769 - mg/dL   DLDL 346 2-53 - mg/dL H  NON-HDL 103 3-752 - mg/dL   DIRECT HDL 54 61-61 - mg/dL   CHOL/HDL 3.4 1.9-6.3 - Ratio    Lendon George,JAY 12/08/2011 04:00:13 PM > lipids well controlled. Harward,Amy 12/08/2011 04:02:55 PM > lmom per dpr.   Lipids much  better. Using CoQ10 to help with Crestor. LDL 82.    3. Palpitations  Continue Diltiazem HCl Tablet, 60 MG, 1 tablet, Orally, as needed for palpitations, 15, Refills 11  Infrequent. Use aditional diltiazem as needed, when she has stressful situations.    HTN: BP controlled at home. Follow Up  1 Year (Reason: paroxysmal atrial fibrillation)     Signed, Mina Marble, MD, [facility] 12/19/2013 12:07 PM

## 2013-12-20 LAB — DIGOXIN LEVEL: Digoxin Level: 1.1 ng/mL (ref 0.8–2.0)

## 2014-02-12 ENCOUNTER — Telehealth: Payer: Self-pay

## 2014-02-12 NOTE — Telephone Encounter (Signed)
Patient called to get samples of crestor placed a month supply up front

## 2014-03-15 ENCOUNTER — Other Ambulatory Visit: Payer: Self-pay

## 2014-03-19 ENCOUNTER — Other Ambulatory Visit (INDEPENDENT_AMBULATORY_CARE_PROVIDER_SITE_OTHER): Payer: Medicare Other | Admitting: *Deleted

## 2014-03-19 DIAGNOSIS — E785 Hyperlipidemia, unspecified: Secondary | ICD-10-CM

## 2014-03-19 DIAGNOSIS — Z79899 Other long term (current) drug therapy: Secondary | ICD-10-CM

## 2014-03-19 LAB — LIPID PANEL
Cholesterol: 150 mg/dL (ref 0–200)
HDL: 53.1 mg/dL (ref >39.00)
LDL Cholesterol: 64 mg/dL (ref 0–99)
NonHDL: 96.9
Total CHOL/HDL Ratio: 3
Triglycerides: 167 mg/dL — ABNORMAL HIGH (ref 0.0–149.0)
VLDL: 33.4 mg/dL (ref 0.0–40.0)

## 2014-03-19 LAB — AST: AST: 18 U/L (ref 0–37)

## 2014-04-01 ENCOUNTER — Other Ambulatory Visit: Payer: Self-pay

## 2014-04-01 MED ORDER — ROSUVASTATIN CALCIUM 10 MG PO TABS: 10.0000 mg | ORAL_TABLET | ORAL | Status: DC

## 2014-04-12 ENCOUNTER — Telehealth: Payer: Self-pay

## 2014-04-12 NOTE — Telephone Encounter (Signed)
Called patient to let her know that I placed crestor samples for her up front

## 2014-09-04 ENCOUNTER — Encounter: Payer: Self-pay | Admitting: Podiatry

## 2014-09-04 ENCOUNTER — Ambulatory Visit (INDEPENDENT_AMBULATORY_CARE_PROVIDER_SITE_OTHER): Payer: Medicare Other | Admitting: Podiatry

## 2014-09-04 VITALS — BP 163/70 | HR 62 | Resp 12

## 2014-09-04 DIAGNOSIS — M79674 Pain in right toe(s): Secondary | ICD-10-CM

## 2014-09-04 DIAGNOSIS — B351 Tinea unguium: Secondary | ICD-10-CM | POA: Diagnosis not present

## 2014-09-04 NOTE — Progress Notes (Signed)
   Subjective:    Patient ID: Melissa Randolph, female    DOB: [birth year], 80 y.o.   MRN: [id number]  HPI N-SORE, DISCOLORATION, THICK L-RT FOOT GREAT TOENAIL D-1 MONTH O-SUDDENLY C-WORSE A-PRESSURE T-OTC ANTIFUNGAL Patient has apply topical and I fungal solution on the right hallux nail for several months without any change of appearance as well as discomfort in the right hallux toenail  Review of Systems  Skin: Positive for color change.       Objective:   Physical Exam  Orientated 3  Vascular: No peripheral edema noted bilaterally DP and PT pulses 2/4 bilaterally Capillary reflex immediate bilaterally  Neurological: Sensation to 10 g monofilament wire intact 5/5 bilaterally Vibratory sensation reactive bilaterally Ankle reflex equal and reactive bilaterally  Dermatological: No open skin lesions noted bilaterally No corns or calluses noted bilaterally The right hallux nail is elongated brittle, discolored and tender direct pain fracture Some texture and color changes noted first left the fifth left toenails  Musculoskeletal: HAV and bunionette bilaterally Patient has stable gait There is no restriction ankle, subtalar, midtarsal joints bilaterally       Assessment & Plan:   Assessment: Satisfactory neurovascular status Symptomatic onychomycoses right hallux nail  Plan: Discussed treatment options detail with patient today including no treatment, topical treatment, oral medication, permanent toenail removal and repetitive debridement. Patient is requesting repetitive debridement at this time  The right hallux nail was debrided mechanically electrically without any bleeding. I advised to apply Vaseline to the nail grooves to soften the callused nail margins  Reappoint at patient's request

## 2014-09-04 NOTE — Patient Instructions (Signed)
Apply Vaseline to the margins of the great toenail daily until the callus softens Return as needed for toenail debridement Other options discussed today include oral medication and permanent toenail removal

## 2014-10-18 ENCOUNTER — Other Ambulatory Visit: Payer: Self-pay | Admitting: Family Medicine

## 2014-10-18 ENCOUNTER — Ambulatory Visit
Admission: RE | Admit: 2014-10-18 | Discharge: 2014-10-18 | Disposition: A | Discharge status: Home / Self Care | Payer: Medicare Other | Source: Ambulatory Visit | Attending: Family Medicine | Admitting: Family Medicine

## 2014-10-18 DIAGNOSIS — M79671 Pain in right foot: Secondary | ICD-10-CM

## 2014-10-29 ENCOUNTER — Telehealth: Payer: Self-pay

## 2014-10-29 ENCOUNTER — Other Ambulatory Visit: Payer: Self-pay

## 2014-10-29 MED ORDER — ROSUVASTATIN CALCIUM 10 MG PO TABS: 10.0000 mg | ORAL_TABLET | ORAL | Status: DC

## 2014-10-29 NOTE — Telephone Encounter (Signed)
Jettie Booze, MD at 12/19/2013 12:07 PM  rosuvastatin (CRESTOR) 10 MG tabletTake 1 tablet (10 mg total) by mouth 3 (three) times a week. Pt takes Monday, Wednesday, Friday on Lipids much better. Using CoQ10 to help with Crestor. LDL 82QYour physician recommends that you continue on your current medications as directed. Please refer to the Current Medication list given to you today.

## 2014-11-20 ENCOUNTER — Ambulatory Visit (INDEPENDENT_AMBULATORY_CARE_PROVIDER_SITE_OTHER): Payer: Medicare Other

## 2014-11-20 ENCOUNTER — Ambulatory Visit (INDEPENDENT_AMBULATORY_CARE_PROVIDER_SITE_OTHER): Payer: Medicare Other | Admitting: Podiatry

## 2014-11-20 ENCOUNTER — Encounter: Payer: Self-pay | Admitting: Podiatry

## 2014-11-20 DIAGNOSIS — R52 Pain, unspecified: Secondary | ICD-10-CM

## 2014-11-20 DIAGNOSIS — M8430XA Stress fracture, unspecified site, initial encounter for fracture: Secondary | ICD-10-CM

## 2014-11-20 DIAGNOSIS — M79673 Pain in unspecified foot: Secondary | ICD-10-CM | POA: Diagnosis not present

## 2014-11-20 DIAGNOSIS — B351 Tinea unguium: Secondary | ICD-10-CM

## 2014-11-20 NOTE — Patient Instructions (Signed)
Today your x-ray was negative for fracture and/or dislocation I recommended she reduce your activity until the residual pain and swelling subside

## 2014-11-20 NOTE — Progress Notes (Signed)
   Subjective:    Patient ID: Melissa Randolph, female    DOB: 12/24/1934, 80 y.o.   MRN: [id number]  HPI   This patient presents today complaining of a 6 week history of swelling and pain the dorsal aspect of the right foot that developed after patient participated in line dancing. At this time she describes a pain and swelling reducing over time, however, when she stands or is active she noticed that her shoe become tight from swelling and she needs to remove the shoe. She is able to participate in either walking her dog a mile a day or line dancing with minimal pain in the right foot. She said she had an x-ray of the right foot was told that she did not have a fracture. She also is requesting that the second right hallux toenail the trim back today's visit  Review of Systems  Cardiovascular: Positive for leg swelling.       Objective:   Physical Exam  Orientated 3  Vascular: No calf edema or calf tenderness bilaterally Mild nonpitting edema dorsal right foot DP and PT pulses 2/4 bilaterally Capillary reflex immediate bilaterally  Neurological: Sensation to 10 g monofilament wire intact 5/5 bilaterally Vibratory sensation reactive bilaterally Ankle reflex equal and reactive bilaterally  Dermatological: No open skin lesions noted bilaterally The right hallux is hypertrophic, brittle, discolored  Musculoskeletal: Palpable tenderness second right metatarsal neck without any palpable lesions There is no tenderness on range of motion second through fifth MPJ, right foot There is no restriction ankle, subtalar, midtarsal joints bilaterally  X-ray examination weightbearing dated September 28th 2016, right foot  Intact bony structure without fracture and/or dislocation Posterior and inferior calcaneal spurs  Radiographic impression: No acute bony abnormality noted in the right foot     Assessment & Plan:   Assessment: Satisfactory neurovascular status On stress  reaction second right metatarsal Mycotic right hallux nail  Plan: Today I reviewed results of examination with patient today. I told her that the x-rays were negative for fracture. Because she has palpable tenderness in the second right metatarsal I advised her that she has a bone stress reaction this area and I recommended she reduce her standing walking until the swelling and pain totally resolved. The right hallux nail was debrided mechanically and electrically without any bleeding  Reappoint at patient's request

## 2014-12-20 ENCOUNTER — Ambulatory Visit (INDEPENDENT_AMBULATORY_CARE_PROVIDER_SITE_OTHER): Payer: Medicare Other | Admitting: Interventional Cardiology

## 2014-12-20 ENCOUNTER — Other Ambulatory Visit: Payer: Self-pay | Admitting: *Deleted

## 2014-12-20 ENCOUNTER — Encounter: Payer: Self-pay | Admitting: Interventional Cardiology

## 2014-12-20 ENCOUNTER — Other Ambulatory Visit: Payer: Self-pay

## 2014-12-20 VITALS — BP 140/50 | HR 59 | Ht 64.5 in | Wt 119.0 lb

## 2014-12-20 DIAGNOSIS — E782 Mixed hyperlipidemia: Secondary | ICD-10-CM | POA: Diagnosis not present

## 2014-12-20 DIAGNOSIS — I480 Paroxysmal atrial fibrillation: Secondary | ICD-10-CM

## 2014-12-20 DIAGNOSIS — R002 Palpitations: Secondary | ICD-10-CM

## 2014-12-20 MED ORDER — DILTIAZEM HCL ER COATED BEADS 180 MG PO CP24: 180.0000 mg | ORAL_CAPSULE | Freq: Two times a day (BID) | ORAL | Status: DC

## 2014-12-20 MED ORDER — ROSUVASTATIN CALCIUM 10 MG PO TABS: 10.0000 mg | ORAL_TABLET | ORAL | Status: DC

## 2014-12-20 NOTE — Patient Instructions (Addendum)
Medication Instructions:  None  Labwork: Your physician recommends that you return for lab work when you are fasting (CMET, Lipids)   Testing/Procedures: None  Follow-Up: Your physician wants you to follow-up in: 1 year with Dr. Irish Lack.  You will receive a reminder letter in the mail two months in advance. If you don't receive a letter, please call our office to schedule the follow-up appointment.   Any Other Special Instructions Will Be Listed Below (If Applicable).     If you need a refill on your cardiac medications before your next appointment, please call your pharmacy.

## 2014-12-20 NOTE — Progress Notes (Signed)
Patient ID: Melissa Randolph, female   DOB: Jun 29, 1934, 80 y.o.   MRN: [id number]     Cardiology Office Note   Date:  12/20/2014   ID:  Melissa Randolph, DOB 1934-03-16, MRN [id number]  PCP:  Gara Kroner, MD    No chief complaint on file. palpitations   Wt Readings from Last 3 Encounters:  12/20/14 119 lb (53.978 kg)  12/19/13 120 lb 1.9 oz (54.486 kg)  06/30/13 121 lb (54.885 kg)       History of Present Illness: Melissa Randolph is a 80 y.o. female  who has had multiple atrial arrhythmias including atrial fibrillation. Sx have ben controlled on [place]. In the past, he has had some palpitations during stressful days, while wearing monitor in 2013, as she was not taking the usual extra diltiazem. She had AFib 3% of the time.   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 Atrial Fibrillation F/U:  Denies : Chest pain.  Dizziness.  Leg edema.  Orthopnea.  Palpitations.  Shortness of breath.  Syncope.  She did aerobic and strength training, yoga classes.  She has only had one episode of plapitations in the last 12 months, lasting about an hour. It was gone within an hour of taking an extra 60 mg diltiazem. Home BP readings are in the 120s /80s range.  She is very satisfied with her arrhythmia control.    She wants her husband to be more active.  She wants him to dance and walk regularly.    Past Medical History  Diagnosis Date  . Hypercholesteremia   . HTN (hypertension)   . Atrial fibrillation ([place])   . Mixed hyperlipidemia   . Palpitation   . Allergy   . Asthma   . Heart murmur     Past Surgical History  Procedure Laterality Date  . Breast surgery       Current Outpatient Prescriptions  Medication Sig Dispense Refill  . Ascorbic Acid (VITAMIN C PO) Take 1 tablet by mouth daily. 400 mg    . aspirin 81 MG tablet Take 162 mg by mouth 2 (two) times daily.      Marland Kitchen azelastine (ASTELIN) 0.1 % nasal spray U 1 SPR IEN BID  3  . cholecalciferol (VITAMIN D) 400 UNITS TABS tablet Take 1,000 Units by mouth.    . diclofenac sodium (VOLTAREN) 1 % GEL Apply 2 g topically as needed.   0  . digoxin (LANOXIN) 0.25 MG tablet Take 1 tablet (0.25 mg total) by mouth daily. 90 tablet 3  . diltiazem (CARDIZEM) 60 MG tablet TAKE TABLET BY MOUTH EVERY DAY AS NEEDED 30 tablet 3  . diltiazem (CARTIA XT) 180 MG 24 hr capsule Take 1 capsule (180 mg total) by mouth 2 (two) times daily. 180 capsule 3  . Fish Oil OIL Take 100 mg by mouth daily.     . fluticasone (FLONASE) 50 MCG/ACT nasal spray Place 2 sprays into the nose daily.    . fluticasone (FLOVENT HFA) 44 MCG/ACT inhaler Inhale 1 puff into the lungs 2 (two) times daily.    Marland Kitchen FLUZONE HIGH-DOSE 0.5 ML SUSY     . Potassium Gluconate 550 MG TABS Take 1 tablet by mouth daily.    . rosuvastatin (CRESTOR) 10 MG tablet Take 1 tablet (10 mg total) by mouth 3 (three) times a week. Pt takes Monday, Wednesday, Friday only 15 tablet 1  No current facility-administered medications for this visit.    Allergies:   Ivp dye; Polycin; and Zithromax    Social History:  The patient  reports that she has never smoked. She has never used smokeless tobacco.   Family History:  The patient's family history includes Cancer in her father; Hyperlipidemia in her mother; Stroke in her mother. There is no history of Heart attack or Hypertension.    ROS:  Please see the history of present illness.   Otherwise, review of systems are positive for rare palpitations which are controlled with medication.   All other systems are reviewed and negative.    PHYSICAL EXAM: VS:  BP 140/50 mmHg  Pulse 59  Ht 5' 4.5" (1.638 m)  Wt 119 lb (53.978 kg)  BMI 20.12 kg/m2 , BMI Body mass index is 20.12 kg/(m^2). GEN: Well nourished, well developed, in no acute distress HEENT: normal Neck: no JVD, carotid bruits, or masses Cardiac: RRR; 2/6 systolic  murmur, no rubs, or gallops,no edema  Respiratory:  clear to auscultation bilaterally, normal work of breathing GI: soft, nontender, nondistended, + BS MS: no deformity or atrophy Skin: warm and dry, no rash Neuro:  Strength and sensation are intact Psych: euthymic mood, full affect   EKG:   The ekg ordered today demonstrates NSR, nonspecific ST segment changes   Recent Labs: No results found for requested labs within last 365 days.   Lipid Panel    Component Value Date/Time   CHOL 150 03/19/2014 0747   TRIG 167.0* 03/19/2014 0747   HDL 53.10 03/19/2014 0747   CHOLHDL 3 03/19/2014 0747   VLDL 33.4 03/19/2014 0747   LDLCALC 64 03/19/2014 0747     Other studies Reviewed: Additional studies/ records that were reviewed today with results demonstrating: Most recent monitor in 2013 reviewed .   ASSESSMENT AND PLAN:  1. Atrial fibrillation: Continue aspirin.  Cardizem and Lanoxin for symptom control.  She continues to be hesitant to use anticoagulation.  3% of the time, she was in AFib in 2013.  She feels that with her active lifestyle,her risk of bleeding is greater then her risk of stroke. She will continue to self medicate with diltiazem to avoid the episodic atrial fibrillation. 2. Hyperlipidemia:  Crestor 10 mg 3x/week.  She is tolerating the dose.  She had lost weight with a careful diet so she ate more dessert to try to gain weight.  TG persistently elevated.  I think her lipids may have worsened since Jan 2016 due to worsening diet.  Will check lipids.  I would still recommend a healthy diet rather than trying to eat poorly intentionally to gain weight.  3. Palpitations: Sx controlled when she premedicates with Diltiazem 60 mg.   Current medicines are reviewed at length with the patient today.  The patient concerns regarding her medicines were addressed.  The following changes have been made:  No change  Labs/ tests ordered today include: plan to check cholesterol No  orders of the defined types were placed in this encounter.    Recommend 150 minutes/week of aerobic exercise Low fat, low carb, high fiber diet recommended  Disposition:   FU in 1 year   Teresita Madura., MD  12/20/2014 9:03 AM    [facility] [address], [place], [place]  [address] Phone: [phone]; Fax: [phone]

## 2014-12-20 NOTE — Telephone Encounter (Signed)
pt called bc medication was sent to the wrong pharmacy, advised her to have [facility] and transfer script. pt expressed understanding.

## 2014-12-23 ENCOUNTER — Other Ambulatory Visit (INDEPENDENT_AMBULATORY_CARE_PROVIDER_SITE_OTHER): Payer: Medicare Other | Admitting: *Deleted

## 2014-12-23 DIAGNOSIS — E782 Mixed hyperlipidemia: Secondary | ICD-10-CM

## 2014-12-23 LAB — COMPREHENSIVE METABOLIC PANEL
ALBUMIN: 3.9 g/dL (ref 3.6–5.1)
ALT: 18 U/L (ref 6–29)
AST: 20 U/L (ref 10–35)
Alkaline Phosphatase: 64 U/L (ref 33–130)
BILIRUBIN TOTAL: 0.4 mg/dL (ref 0.2–1.2)
BUN: 12 mg/dL (ref 7–25)
CHLORIDE: 103 mmol/L (ref 98–110)
CO2: 28 mmol/L (ref 20–31)
Calcium: 9.2 mg/dL (ref 8.6–10.4)
Creat: 0.73 mg/dL (ref 0.60–0.93)
Glucose, Bld: 98 mg/dL (ref 65–99)
Potassium: 3.9 mmol/L (ref 3.5–5.3)
SODIUM: 140 mmol/L (ref 135–146)
TOTAL PROTEIN: 6.5 g/dL (ref 6.1–8.1)

## 2014-12-23 LAB — LIPID PANEL
CHOLESTEROL: 154 mg/dL (ref 125–200)
HDL: 57 mg/dL (ref >=46)
LDL Cholesterol: 75 mg/dL (ref <130)
TRIGLYCERIDES: 112 mg/dL (ref <150)
Total CHOL/HDL Ratio: 2.7 Ratio (ref <=5.0)
VLDL: 22 mg/dL (ref <30)

## 2014-12-25 ENCOUNTER — Encounter: Payer: Self-pay | Admitting: *Deleted

## 2014-12-25 ENCOUNTER — Telehealth: Payer: Self-pay | Admitting: Interventional Cardiology

## 2014-12-25 NOTE — Telephone Encounter (Signed)
Spoke with pt and informed her of lab results. Pt verbalized understanding and was appreciative for call.

## 2014-12-25 NOTE — Telephone Encounter (Signed)
New problem ° ° °Pt returning your call. °

## 2015-01-20 ENCOUNTER — Other Ambulatory Visit: Payer: Self-pay | Admitting: Interventional Cardiology

## 2015-02-02 ENCOUNTER — Other Ambulatory Visit: Payer: Self-pay | Admitting: Interventional Cardiology

## 2015-10-08 ENCOUNTER — Other Ambulatory Visit: Payer: Self-pay | Admitting: Family Medicine

## 2015-10-08 ENCOUNTER — Ambulatory Visit
Admission: RE | Admit: 2015-10-08 | Discharge: 2015-10-08 | Disposition: A | Discharge status: Home / Self Care | Payer: Medicare Other | Source: Ambulatory Visit | Attending: Family Medicine | Admitting: Family Medicine

## 2015-10-08 DIAGNOSIS — M25472 Effusion, left ankle: Secondary | ICD-10-CM

## 2015-12-01 ENCOUNTER — Other Ambulatory Visit: Payer: Self-pay | Admitting: *Deleted

## 2015-12-01 MED ORDER — DIGOXIN 250 MCG PO TABS: 250.0000 ug | ORAL_TABLET | Freq: Every day | ORAL | 0 refills | Status: DC

## 2015-12-01 MED ORDER — ROSUVASTATIN CALCIUM 10 MG PO TABS: 10.0000 mg | ORAL_TABLET | ORAL | 0 refills | Status: DC

## 2015-12-16 NOTE — Progress Notes (Signed)
Patient ID: Melissa Randolph, female   DOB: 1934/03/24, 81 y.o.   MRN: [id number]     Cardiology Office Note   Date:  12/17/2015   ID:  Melissa Randolph, DOB [birth year], MRN [id number]  PCP:  Gara Kroner, MD    No chief complaint on file. palpitations   Wt Readings from Last 3 Encounters:  12/17/15 54 kg (119 lb)  12/20/14 54 kg (119 lb)  12/19/13 54.5 kg (120 lb 1.9 oz)       History of Present Illness: Melissa Randolph is a 81 y.o. female  who has had multiple atrial arrhythmias including atrial fibrillation. Sx have ben controlled on [place]. In the past, he has had some palpitations during stressful days, while wearing monitor in 2013, as she was not taking the usual extra diltiazem. She had AFib 3% of the time.   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 Atrial Fibrillation F/U:  Denies : Chest pain. Dizziness.  Orthopnea. Palpitations. Shortness of breath. Syncope.  She did aerobic and strength training, yoga classes.  She has only had one episode of plapitations in the last 12 months, lasting about an hour when she woke up from a bad dream. It was gone within an hour of taking an extra 60 mg diltiazem. Home BP readings are in the 120s /80s range.  She is very satisfied with her arrhythmia control.    She wants her husband to be more active.  She wants him to dance and walk regularly.  She had an ankle injury after tripping on something.  She continues to pretreat with diltiazem before stressful situation.  Blood pressure readings at home are better controlled.  123456 systolic.   She had some bradycardia with cataract surgery but this resolved.     Past Medical History:  Diagnosis Date  . Allergy   . Asthma   . Atrial fibrillation ([place])   . Heart murmur   . HTN (hypertension)   . Hypercholesteremia   . Mixed hyperlipidemia   . Palpitation     Past  Surgical History:  Procedure Laterality Date  . BREAST SURGERY       Current Outpatient Prescriptions  Medication Sig Dispense Refill  . Ascorbic Acid (VITAMIN C PO) Take 1 tablet by mouth daily. 400 mg    . aspirin 81 MG tablet Take 325 mg by mouth daily. TAKE 2 TABS BY MOUTH IN THE AM  AND TAKE 2 TABS  BY MOUTH  IN THE EVENING TO TOTAL 325    . azelastine (ASTELIN) 0.1 % nasal spray Place 1 spray into both nostrils 2 (two) times daily. Use in each nostril as directed    . cholecalciferol (VITAMIN D) 400 UNITS TABS tablet Take 1,000 Units by mouth daily.     . diclofenac sodium (VOLTAREN) 1 % GEL Apply 2 g topically 4 (four) times daily as needed (pain).    Marland Kitchen digoxin (DIGOX) 0.25 MG tablet Take 1 tablet (250 mcg total) by mouth daily. 12 tablet 0  . diltiazem (CARDIZEM) 60 MG tablet Take 60 mg by mouth daily as needed (Take as directed).    Marland Kitchen diltiazem (CARTIA XT) 180 MG 24 hr capsule Take 1 capsule (180 mg total) by mouth 2 (two) times daily. 180 capsule 3  . fexofenadine (ALLEGRA) 60 MG tablet Take 60 mg by mouth daily as needed for allergies or rhinitis.    Marland Kitchen  Fish Oil OIL Take 100 mg by mouth daily.     . fluticasone (FLONASE) 50 MCG/ACT nasal spray Place 2 sprays into the nose daily.    . fluticasone (FLOVENT HFA) 44 MCG/ACT inhaler Inhale 1 puff into the lungs 2 (two) times daily.    Marland Kitchen FLUZONE HIGH-DOSE 0.5 ML SUSY FLU SHOT    . Potassium Gluconate 550 MG TABS Take 1 tablet by mouth daily.    . rosuvastatin (CRESTOR) 10 MG tablet Take 1 tablet (10 mg total) by mouth 3 (three) times a week. Pt takes Monday, Wednesday, Friday only 4 tablet 0   No current facility-administered medications for this visit.     Allergies:   Ivp dye [iodinated diagnostic agents]; Polycin [bacitracin-polymyxin b]; and Zithromax [azithromycin]    Social History:  The patient  reports that she has never smoked. She has never used smokeless tobacco.   Family History:  The patient's family history includes  Cancer in her father; Hyperlipidemia in her mother; Stroke in her mother.    ROS:  Please see the history of present illness.   Otherwise, review of systems are positive for rare palpitations which are controlled with medication.   All other systems are reviewed and negative.    PHYSICAL EXAM: VS:  BP (!) 138/58   Pulse (!) 57   Ht 5' 4.5" (1.638 m)   Wt 54 kg (119 lb)   BMI 20.11 kg/m  , BMI Body mass index is 20.11 kg/m. GEN: Well nourished, well developed, in no acute distress  HEENT: normal  Neck: no JVD, carotid bruits, or masses Cardiac: RRR; 2/6 systolic murmur, no rubs, or gallops,no edema  Respiratory:  clear to auscultation bilaterally, normal work of breathing GI: soft, nontender, nondistended, + BS MS: no deformity or atrophy  Skin: warm and dry, no rash Neuro:  Strength and sensation are intact Psych: euthymic mood, full affect   EKG:   The ekg ordered today demonstrates SB, RBBB, nonspecific ST segment changes   Recent Labs: 12/23/2014: ALT 18; BUN 12; Creat 0.73; Potassium 3.9; Sodium 140   Lipid Panel    Component Value Date/Time   CHOL 154 12/23/2014 0803   TRIG 112 12/23/2014 0803   HDL 57 12/23/2014 0803   CHOLHDL 2.7 12/23/2014 0803   VLDL 22 12/23/2014 0803   LDLCALC 75 12/23/2014 0803     Other studies Reviewed: Additional studies/ records that were reviewed today with results demonstrating: Most recent monitor in 2013 reviewed .   ASSESSMENT AND PLAN:  1. Atrial fibrillation: Continue aspirin.  Cardizem and Lanoxin for symptom control.  She continues to be hesitant to use anticoagulation.  3% of the time, she was in AFib in 2013.  She feels that with her active lifestyle,her risk of bleeding is greater then her risk of stroke. She will continue to self medicate with diltiazem to avoid the episodic atrial fibrillation.Sx are well controlled. Check TSH as well.   2. Hyperlipidemia:  Crestor 10 mg 3x/week.  She is tolerating the dose.  TG  persistently elevated but better in 2016.   Will check lipids.  I would still recommend a healthy diet rather than trying to eat poorly intentionally to gain weight.  3. Palpitations: Sx controlled when she premedicates with Diltiazem 60 mg.  Back to regular activities after ankle injury.   Current medicines are reviewed at length with the patient today.  The patient concerns regarding her medicines were addressed.  The following changes have been made:  No change  Labs/ tests ordered today include: plan to check cholesterol No orders of the defined types were placed in this encounter.   Recommend 150 minutes/week of aerobic exercise Low fat, low carb, high fiber diet recommended  Disposition:   FU in 1 year   Signed, Larae Grooms, MD  12/17/2015 10:39 AM    [facility] [address], [place], [place]  [address] Phone: [phone]; Fax: [phone]

## 2015-12-17 ENCOUNTER — Encounter (INDEPENDENT_AMBULATORY_CARE_PROVIDER_SITE_OTHER): Payer: Self-pay

## 2015-12-17 ENCOUNTER — Ambulatory Visit (INDEPENDENT_AMBULATORY_CARE_PROVIDER_SITE_OTHER): Payer: Medicare Other | Admitting: Interventional Cardiology

## 2015-12-17 ENCOUNTER — Encounter: Payer: Self-pay | Admitting: Interventional Cardiology

## 2015-12-17 VITALS — BP 138/58 | HR 57 | Ht 64.5 in | Wt 119.0 lb

## 2015-12-17 DIAGNOSIS — I491 Atrial premature depolarization: Secondary | ICD-10-CM | POA: Diagnosis not present

## 2015-12-17 DIAGNOSIS — E782 Mixed hyperlipidemia: Secondary | ICD-10-CM

## 2015-12-17 DIAGNOSIS — I480 Paroxysmal atrial fibrillation: Secondary | ICD-10-CM

## 2015-12-17 DIAGNOSIS — R002 Palpitations: Secondary | ICD-10-CM | POA: Diagnosis not present

## 2015-12-17 MED ORDER — DILTIAZEM HCL 60 MG PO TABS: 60.0000 mg | ORAL_TABLET | Freq: Every day | ORAL | 3 refills | Status: DC | PRN

## 2015-12-17 MED ORDER — POTASSIUM GLUCONATE 550 MG PO TABS: 1.0000 | ORAL_TABLET | Freq: Every day | ORAL | 3 refills | Status: AC

## 2015-12-17 MED ORDER — ROSUVASTATIN CALCIUM 10 MG PO TABS: 10.0000 mg | ORAL_TABLET | ORAL | 3 refills | Status: DC

## 2015-12-17 MED ORDER — DILTIAZEM HCL ER COATED BEADS 180 MG PO CP24: 180.0000 mg | ORAL_CAPSULE | Freq: Two times a day (BID) | ORAL | 3 refills | Status: DC

## 2015-12-17 MED ORDER — DIGOXIN 250 MCG PO TABS: 250.0000 ug | ORAL_TABLET | Freq: Every day | ORAL | 3 refills | Status: DC

## 2015-12-17 NOTE — Patient Instructions (Addendum)
**Note De-Identified Naksh Radi Obfuscation** Medication Instructions:  Same-no changes  Labwork: Tomorrow-Please do not eat or drink after midnight tonight.  Testing/Procedures: None  Follow-Up: Your physician wants you to follow-up in: 1 year. You will receive a reminder letter in the mail two months in advance. If you don't receive a letter, please call our office to schedule the follow-up appointment.   Any Other Special Instructions Will Be Listed Below (If Applicable).     If you need a refill on your cardiac medications before your next appointment, please call your pharmacy.

## 2015-12-17 NOTE — Addendum Note (Signed)
Addended by: Dennie Fetters on: 12/17/2015 11:34 AM   Modules accepted: Orders

## 2015-12-18 ENCOUNTER — Other Ambulatory Visit: Payer: Medicare Other | Admitting: *Deleted

## 2015-12-18 DIAGNOSIS — I480 Paroxysmal atrial fibrillation: Secondary | ICD-10-CM

## 2015-12-18 DIAGNOSIS — R002 Palpitations: Secondary | ICD-10-CM

## 2015-12-18 DIAGNOSIS — E782 Mixed hyperlipidemia: Secondary | ICD-10-CM

## 2015-12-18 DIAGNOSIS — I491 Atrial premature depolarization: Secondary | ICD-10-CM

## 2015-12-18 LAB — COMPREHENSIVE METABOLIC PANEL
ALBUMIN: 4.1 g/dL (ref 3.6–5.1)
ALT: 16 U/L (ref 6–29)
AST: 29 U/L (ref 10–35)
Alkaline Phosphatase: 54 U/L (ref 33–130)
BUN: 16 mg/dL (ref 7–25)
CALCIUM: 9 mg/dL (ref 8.6–10.4)
CHLORIDE: 104 mmol/L (ref 98–110)
CO2: 29 mmol/L (ref 20–31)
Creat: 0.78 mg/dL (ref 0.60–0.93)
Glucose, Bld: 96 mg/dL (ref 65–99)
POTASSIUM: 4.7 mmol/L (ref 3.5–5.3)
SODIUM: 141 mmol/L (ref 135–146)
TOTAL PROTEIN: 6.7 g/dL (ref 6.1–8.1)
Total Bilirubin: 0.5 mg/dL (ref 0.2–1.2)

## 2015-12-18 LAB — LIPID PANEL
CHOL/HDL RATIO: 3 ratio (ref <=5.0)
CHOLESTEROL: 172 mg/dL (ref 125–200)
HDL: 57 mg/dL (ref >=46)
LDL CALC: 92 mg/dL (ref <130)
TRIGLYCERIDES: 113 mg/dL (ref <150)
VLDL: 23 mg/dL (ref <30)

## 2015-12-18 LAB — TSH: TSH: 2.97 mIU/L

## 2016-04-01 ENCOUNTER — Telehealth: Payer: Self-pay | Admitting: Interventional Cardiology

## 2016-04-01 NOTE — Telephone Encounter (Signed)
Corbin Ade is advised that it is ok for the pt to receive generic Digoxin from a different manufacturer. She verbalized understanding.  Will forward to Dr Irish Lack as Juluis Rainier.

## 2016-04-01 NOTE — Telephone Encounter (Signed)
New Message  Katlyn call requesting to speak with RN. She states the pt medication manufacture for Digoxin .0.25mg  does not have the medication and would like to speak with someone about an alternate medication.please call back to discuss

## 2016-11-22 ENCOUNTER — Ambulatory Visit (INDEPENDENT_AMBULATORY_CARE_PROVIDER_SITE_OTHER): Payer: Medicare Other | Admitting: Podiatry

## 2016-11-22 ENCOUNTER — Other Ambulatory Visit: Payer: Self-pay | Admitting: Podiatry

## 2016-11-22 ENCOUNTER — Encounter: Payer: Self-pay | Admitting: Podiatry

## 2016-11-22 ENCOUNTER — Ambulatory Visit (INDEPENDENT_AMBULATORY_CARE_PROVIDER_SITE_OTHER): Payer: Medicare Other

## 2016-11-22 VITALS — BP 175/117 | HR 68

## 2016-11-22 DIAGNOSIS — M7751 Other enthesopathy of right foot: Secondary | ICD-10-CM

## 2016-11-22 DIAGNOSIS — M779 Enthesopathy, unspecified: Secondary | ICD-10-CM

## 2016-11-22 DIAGNOSIS — M778 Other enthesopathies, not elsewhere classified: Secondary | ICD-10-CM

## 2016-11-22 DIAGNOSIS — R52 Pain, unspecified: Secondary | ICD-10-CM | POA: Diagnosis not present

## 2016-11-22 MED ORDER — PREDNISONE 10 MG (21) PO TBPK: ORAL_TABLET | ORAL | 0 refills | Status: DC

## 2016-11-22 NOTE — Progress Notes (Signed)
   Subjective:    Patient ID: Melissa Randolph, female    DOB: [birth year], 82 y.o.   MRN: [id number]  HPI This patient presents today complaining of a painful plantar aspect right foot for the past 3 days. The symptoms are aggravated with weightbearing and relieved with rest and elevation. Patient describes playing pickle ball the past several days and continue ongoing standing walking after several rounds pickle ball with pain and discomfort increasing over time. She is apply ice to the area to wear soft shoe which has reduced the discomfort slightly.     Review of Systems  All other systems reviewed and are negative.      Objective:   Physical Exam  Patient's husband is present treatment room  Orientated 3  Vascular: No calf edema or calf tenderness bilaterally DP and PT pulses 2/4 bilaterally Capillary reflex immediate bilaterally  Neurological: Sensation to 10 g monofilament wire intact 5/5 bilaterally Vibratory sensation reactive bilaterally Ankle reflexes reactive bilaterally  Dermatological: No open skin lesions bilaterally Atrophic skin bilaterally  Musculoskeletal: Low-grade edema plantar third fourth MPJ Palpable tenderness plantar third fourth MPJ which shoe placed patient's discomfort  X-ray examination weightbearing right foot dated 11/22/2016 Intact bony structure without fracture and/or dislocation Decreased joint space first MPJ Decreased joint space Lisfranc's joint  Radiographic impression: No acute bony abnormality noted in the weightbearing x-ray of the right foot dated 11/22/2016       Assessment & Plan:   Assessment: Plantar capsulitis third and fourth MPJ right foot associated with physical activity described above  Plan: Limit standing walking Attach metatarsal pad inside patient's shoe Rx 6 day 10 mg prednisone tapering Dosepak   Reappoint at patient's request

## 2016-11-22 NOTE — Patient Instructions (Signed)
Reduce and limit standing walking is much as possible Wear soft athletic style shoe Take 10 mg prednisone as discussed Return if symptoms do not improve gradually over the next several weeks

## 2016-12-10 ENCOUNTER — Other Ambulatory Visit: Payer: Self-pay | Admitting: Interventional Cardiology

## 2016-12-10 DIAGNOSIS — I480 Paroxysmal atrial fibrillation: Secondary | ICD-10-CM

## 2016-12-19 NOTE — Progress Notes (Signed)
Cardiology Office Note   Date:  12/20/2016   ID:  Melissa Randolph, DOB [birth year], MRN [id number]  PCP:  Antony Contras, MD    No chief complaint on file. PAF   Wt Readings from Last 3 Encounters:  12/20/16 121 lb 6.4 oz (55.1 kg)  12/17/15 119 lb (54 kg)  12/20/14 119 lb (54 kg)       History of Present Illness: Melissa Randolph is a 82 y.o. female  who has had multiple atrial arrhythmias including atrial fibrillation. Sx have ben controlled on [place]. In the past, he has had some palpitations during stressful days, while wearing monitor in 2013, as she was not taking the usual extra diltiazem. She had AFib 3% of the time.   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  Recently, she had a foot injury requiring steroids and preventing exercise.  Sx have improved.    Denies : Chest pain. Dizziness. Leg edema. Nitroglycerin use. Orthopnea. Palpitations. Paroxysmal nocturnal dyspnea. Shortness of breath. Syncope.   Continues to use diltiazem before stressful activity.    BP has been normal at home.  127-517 systolic range. No bleeding problems.     Past Medical History:  Diagnosis Date  . Allergy   . Asthma   . Atrial fibrillation ([place])   . Heart murmur   . HTN (hypertension)   . Hypercholesteremia   . Mixed hyperlipidemia   . Palpitation     Past Surgical History:  Procedure Laterality Date  . BREAST SURGERY       Current Outpatient Prescriptions  Medication Sig Dispense Refill  . Ascorbic Acid (VITAMIN C PO) Take 1 tablet by mouth daily. 400 mg    . azelastine (ASTELIN) 0.1 % nasal spray Place 1 spray into both nostrils 2 (two) times daily. Use in each nostril as directed    . CARTIA XT 180 MG 24 hr capsule TAKE 1 CAPSULE(180 MG) BY MOUTH TWICE DAILY 180 capsule 0  . cholecalciferol (VITAMIN D) 400 UNITS TABS tablet Take 1,000 Units by mouth daily.       . diclofenac sodium (VOLTAREN) 1 % GEL Apply 2 g topically 4 (four) times daily as needed (pain).    Marland Kitchen digoxin (DIGOX) 0.25 MG tablet Take 1 tablet (250 mcg total) by mouth daily. 93 tablet 3  . diltiazem (CARDIZEM) 60 MG tablet Take 1 tablet (60 mg total) by mouth daily as needed (Take as directed). 93 tablet 3  . fexofenadine (ALLEGRA) 60 MG tablet Take 60 mg by mouth daily as needed for allergies or rhinitis.    . Fish Oil OIL Take 100 mg by mouth daily.     . fluticasone (FLONASE) 50 MCG/ACT nasal spray Place 2 sprays into the nose daily.    . fluticasone (FLOVENT HFA) 44 MCG/ACT inhaler Inhale 1 puff into the lungs 2 (two) times daily.    Marland Kitchen FLUZONE HIGH-DOSE 0.5 ML SUSY FLU SHOT    . Potassium Gluconate 550 MG TABS Take 1 tablet (550 mg total) by mouth daily. 93 tablet 3  . rosuvastatin (CRESTOR) 10 MG tablet Take 1 tablet (10 mg total) by mouth 3 (three) times a week. Pt takes Monday, Wednesday, Friday only 45 tablet 3  . aspirin EC 81 MG tablet Take 1 tablet (81 mg total) by mouth daily. 90 tablet 3   No current facility-administered medications for this visit.  Allergies:   Ivp dye [iodinated diagnostic agents]; Polycin [bacitracin-polymyxin b]; and Zithromax [azithromycin]    Social History:  The patient  reports that she has never smoked. She has never used smokeless tobacco.   Family History:  The patient's family history includes Cancer in her father; Hyperlipidemia in her mother; Stroke in her mother.    ROS:  Please see the history of present illness.   Otherwise, review of systems are positive for recent foot problems; easy bruising.   All other systems are reviewed and negative.    PHYSICAL EXAM: VS:  BP (!) 160/62   Pulse (!) 59   Ht 5' 4.5" (1.638 m)   Wt 121 lb 6.4 oz (55.1 kg)   SpO2 98%   BMI 20.52 kg/m  , BMI Body mass index is 20.52 kg/m. GEN: Well nourished, well developed, in no acute distress  HEENT: normal  Neck: no JVD, carotid bruits, or  masses Cardiac: bradycardic, premature beats; no murmurs, rubs, or gallops,no edema  Respiratory:  clear to auscultation bilaterally, normal work of breathing GI: soft, nontender, nondistended, + BS MS: no deformity or atrophy  Skin: warm and dry, no rash Neuro:  Strength and sensation are intact Psych: euthymic mood, full affect   EKG:   The ekg ordered today demonstrated: sinus bradycardia, prolonged PR, PACs, RBBB; no change from 2017 ECG   Recent Labs: No results found for requested labs within last 8760 hours.   Lipid Panel    Component Value Date/Time   CHOL 172 12/18/2015 0739   TRIG 113 12/18/2015 0739   HDL 57 12/18/2015 0739   CHOLHDL 3.0 12/18/2015 0739   VLDL 23 12/18/2015 0739   LDLCALC 92 12/18/2015 0739     Other studies Reviewed: Additional studies/ records that were reviewed today with results demonstrating: 2017 ECG.   ASSESSMENT AND PLAN:  1. PAF: No symptoms of atrial fibrillation at this time.  Decrease aspirin dose to 81 mg daily. 2. Hyperlipidemia: She is due for lipid check.  Continue current lipid-lowering therapy.  Tolerating rosuvastatin 3 times a week. 3. Palpitations: Managed with as needed diltiazem.  She pre-medicates before stressful activities. 4. High BP readings in the MDs office.  I personally checked her blood pressure today in the office.  Continued controlled readings at home.  Continue current medications.   Current medicines are reviewed at length with the patient today.  The patient concerns regarding her medicines were addressed.  The following changes have been made:  No change  Labs/ tests ordered today include: [place], lipids, TSH  Orders Placed This Encounter  Procedures  . Comprehensive metabolic panel  . Lipid panel  . TSH  . EKG 12-Lead    Recommend 150 minutes/week of aerobic exercise Low fat, low carb, high fiber diet recommended  Disposition:   FU in 1 year   Signed, Larae Grooms, MD  12/20/2016  9:00 AM    [facility] [address], [place], [place]  [address] Phone: [phone]; Fax: [phone]

## 2016-12-20 ENCOUNTER — Ambulatory Visit (INDEPENDENT_AMBULATORY_CARE_PROVIDER_SITE_OTHER): Payer: Medicare Other | Admitting: Interventional Cardiology

## 2016-12-20 ENCOUNTER — Encounter: Payer: Self-pay | Admitting: Interventional Cardiology

## 2016-12-20 VITALS — BP 160/62 | HR 59 | Ht 64.5 in | Wt 121.4 lb

## 2016-12-20 DIAGNOSIS — E782 Mixed hyperlipidemia: Secondary | ICD-10-CM | POA: Diagnosis not present

## 2016-12-20 DIAGNOSIS — I480 Paroxysmal atrial fibrillation: Secondary | ICD-10-CM

## 2016-12-20 DIAGNOSIS — I491 Atrial premature depolarization: Secondary | ICD-10-CM | POA: Diagnosis not present

## 2016-12-20 DIAGNOSIS — R030 Elevated blood-pressure reading, without diagnosis of hypertension: Secondary | ICD-10-CM | POA: Diagnosis not present

## 2016-12-20 LAB — LIPID PANEL
CHOL/HDL RATIO: 3 ratio (ref 0.0–4.4)
Cholesterol, Total: 180 mg/dL (ref 100–199)
HDL: 61 mg/dL (ref >39)
LDL Calculated: 90 mg/dL (ref 0–99)
Triglycerides: 143 mg/dL (ref 0–149)
VLDL CHOLESTEROL CAL: 29 mg/dL (ref 5–40)

## 2016-12-20 LAB — COMPREHENSIVE METABOLIC PANEL
A/G RATIO: 2.3 — AB (ref 1.2–2.2)
ALBUMIN: 4.6 g/dL (ref 3.5–4.8)
ALT: 18 IU/L (ref 0–32)
AST: 21 IU/L (ref 0–40)
Alkaline Phosphatase: 83 IU/L (ref 39–117)
BILIRUBIN TOTAL: 0.4 mg/dL (ref 0.0–1.2)
BUN / CREAT RATIO: 14 (ref 12–28)
BUN: 12 mg/dL (ref 8–27)
CHLORIDE: 102 mmol/L (ref 96–106)
CO2: 24 mmol/L (ref 20–29)
Calcium: 9.7 mg/dL (ref 8.7–10.3)
Creatinine, Ser: 0.86 mg/dL (ref 0.57–1.00)
GFR, EST AFRICAN AMERICAN: 74 mL/min/{1.73_m2} (ref >59)
GFR, EST NON AFRICAN AMERICAN: 64 mL/min/{1.73_m2} (ref >59)
GLUCOSE: 109 mg/dL — AB (ref 65–99)
Globulin, Total: 2 g/dL (ref 1.5–4.5)
Potassium: 4.4 mmol/L (ref 3.5–5.2)
Sodium: 144 mmol/L (ref 134–144)
TOTAL PROTEIN: 6.6 g/dL (ref 6.0–8.5)

## 2016-12-20 LAB — TSH: TSH: 3.06 u[IU]/mL (ref 0.450–4.500)

## 2016-12-20 MED ORDER — ASPIRIN EC 81 MG PO TBEC: 81.0000 mg | DELAYED_RELEASE_TABLET | Freq: Every day | ORAL | 3 refills | Status: DC

## 2016-12-20 NOTE — Patient Instructions (Signed)
Medication Instructions:  Your physician has recommended you make the following change in your medication:   STOP: aspirin 325 mg START: aspirin 81 mg daily    Labwork: Today for complete metabolic panel, fasting lipids, and TSH  Testing/Procedures: None ordered   Follow-Up: Your physician wants you to follow-up in: 1 year with Dr. Irish Lack. You will receive a reminder letter in the mail two months in advance. If you don't receive a letter, please call our office to schedule the follow-up appointment.   Any Other Special Instructions Will Be Listed Below (If Applicable).     If you need a refill on your cardiac medications before your next appointment, please call your pharmacy.

## 2017-01-31 ENCOUNTER — Other Ambulatory Visit: Payer: Self-pay | Admitting: Interventional Cardiology

## 2017-02-10 ENCOUNTER — Other Ambulatory Visit: Payer: Self-pay | Admitting: Interventional Cardiology

## 2017-03-12 ENCOUNTER — Other Ambulatory Visit: Payer: Self-pay | Admitting: Interventional Cardiology

## 2017-03-12 DIAGNOSIS — I480 Paroxysmal atrial fibrillation: Secondary | ICD-10-CM

## 2017-03-14 ENCOUNTER — Other Ambulatory Visit: Payer: Self-pay | Admitting: Interventional Cardiology

## 2017-03-14 DIAGNOSIS — I480 Paroxysmal atrial fibrillation: Secondary | ICD-10-CM

## 2017-03-14 MED ORDER — DILTIAZEM HCL ER COATED BEADS 180 MG PO CP24: ORAL_CAPSULE | ORAL | 2 refills | Status: DC

## 2017-03-14 NOTE — Telephone Encounter (Signed)
Pt's medication was sent to pt's pharmacy as requested. Confirmation received.  °

## 2017-06-22 ENCOUNTER — Encounter: Payer: Self-pay | Admitting: Family Medicine

## 2017-08-10 ENCOUNTER — Ambulatory Visit: Payer: Medicare Other | Admitting: Podiatry

## 2017-08-10 ENCOUNTER — Encounter: Payer: Self-pay | Admitting: Podiatry

## 2017-08-10 DIAGNOSIS — M79674 Pain in right toe(s): Secondary | ICD-10-CM

## 2017-08-10 DIAGNOSIS — B351 Tinea unguium: Secondary | ICD-10-CM | POA: Diagnosis not present

## 2017-08-10 NOTE — Progress Notes (Signed)
Complaint:  Visit Type: Patient returns to my office for continued preventative foot care services. Complaint: Patient states" my right great toenail  has grown long and thick and become painful to walk and wear shoes" . The patient presents for preventative foot care services. No changes to ROS.  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  Podiatric Exam: Vascular: dorsalis pedis and posterior tibial pulses are palpable bilateral. Capillary return is immediate. Temperature gradient is WNL. Skin turgor WNL  Sensorium: Normal Semmes Weinstein monofilament test. Normal tactile sensation bilaterally. Nail Exam: Pt has thick disfigured discolored nails with subungual debris noted bilateral entire nail hallux through fifth toenails Ulcer Exam: There is no evidence of ulcer or pre-ulcerative changes or infection. Orthopedic Exam: Muscle tone and strength are WNL. No limitations in general ROM. No crepitus or effusions noted. Foot type and digits show no abnormalities. Bony prominences are unremarkable. Skin: No Porokeratosis. No infection or ulcers  Diagnosis:  Onychomycosis, , Pain in right toe,   Treatment & Plan Procedures and Treatment: Consent by patient was obtained for treatment procedures.   Debridement of mycotic and hypertrophic hallux nail right.. No ulceration, no infection noted.  Return Visit-Office Procedure: Patient instructed to return to the office for a follow up visit 4 months for continued evaluation and treatment.    Gardiner Barefoot DPM

## 2017-12-02 ENCOUNTER — Encounter: Payer: Self-pay | Admitting: Physician Assistant

## 2017-12-15 ENCOUNTER — Other Ambulatory Visit: Payer: Self-pay | Admitting: Interventional Cardiology

## 2017-12-15 DIAGNOSIS — I480 Paroxysmal atrial fibrillation: Secondary | ICD-10-CM

## 2017-12-20 ENCOUNTER — Ambulatory Visit: Payer: Medicare Other | Admitting: Physician Assistant

## 2017-12-20 ENCOUNTER — Encounter: Payer: Self-pay | Admitting: Physician Assistant

## 2017-12-20 VITALS — BP 128/50 | HR 64 | Ht 64.5 in | Wt 120.6 lb

## 2017-12-20 DIAGNOSIS — E782 Mixed hyperlipidemia: Secondary | ICD-10-CM

## 2017-12-20 DIAGNOSIS — I480 Paroxysmal atrial fibrillation: Secondary | ICD-10-CM

## 2017-12-20 DIAGNOSIS — I10 Essential (primary) hypertension: Secondary | ICD-10-CM

## 2017-12-20 MED ORDER — APIXABAN 2.5 MG PO TABS: 2.5000 mg | ORAL_TABLET | Freq: Two times a day (BID) | ORAL | 11 refills | Status: DC

## 2017-12-20 NOTE — Progress Notes (Signed)
Cardiology Office Note    Date:  12/20/2017   ID:  SHYKERIA SAKAMOTO, DOB 04/01/1934, MRN [id number]  PCP:  Antony Contras, MD  Cardiologist: Larae Grooms, MD EPS: None  Chief Complaint  Patient presents with  . Follow-up    History of Present Illness:  Melissa Randolph is a 83 y.o. female with history of atrial fibrillation treated with diltiazem and takes extra before a stressful activity.  Also has hypertension, hyperlipidemia.  While wearing monitor in 2013 she had atrial fibrillation 3% of the time.  CHA2DS2-VASc equals 4 for age, female, and hypertension.  Patient declined anticoagulation in 2017.  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  Also started playing pickle ball.  Does regular exercise.  Is in atrial fibrillation today.  Did not realize it.  She said yesterday her blood pressure cuff had some irregularity so she is not surprised.  She did take an extra diltiazem today because she was coming here.  She takes an extra diltiazem when she gets stressed or has a busy weekend playing the organ.   Past Medical History:  Diagnosis Date  . Allergy   . Asthma   . Atrial fibrillation ([place])   . Heart murmur   . HTN (hypertension)   . Hypercholesteremia   . Mixed hyperlipidemia   . Palpitation     Past Surgical History:  Procedure Laterality Date  . BREAST SURGERY      Current Medications: Current Meds  Medication Sig  . Ascorbic Acid (VITAMIN C PO) Take 1 tablet by mouth daily. 400 mg  . aspirin EC 81 MG tablet Take 1 tablet (81 mg total) by mouth daily.  Marland Kitchen azelastine (ASTELIN) 0.1 % nasal spray Place 1 spray into both nostrils 2 (two) times daily. Use in each nostril as directed  . cholecalciferol (VITAMIN D) 400 UNITS TABS tablet Take 1,000 Units by mouth daily.   . diclofenac sodium (VOLTAREN) 1 % GEL Apply 2 g topically 4 (four) times daily as needed (pain).  Marland Kitchen DIGOX 250 MCG tablet TAKE ONE TABLET BY  MOUTH DAILY  . diltiazem (CARDIZEM) 60 MG tablet Take 1 tablet (60 mg total) by mouth daily as needed (Take as directed).  Marland Kitchen diltiazem (CARTIA XT) 180 MG 24 hr capsule Take 1 capsule (180 mg total) by mouth 2 (two) times daily. TAKE ONE CAPSULE BY MOUTH TWICE A DAY  . fexofenadine (ALLEGRA) 60 MG tablet Take 60 mg by mouth daily as needed for allergies or rhinitis.  . Fish Oil OIL Take 100 mg by mouth daily.   . fluticasone (FLONASE) 50 MCG/ACT nasal spray Place 2 sprays into the nose daily.  . fluticasone (FLOVENT HFA) 44 MCG/ACT inhaler Inhale 1 puff into the lungs 2 (two) times daily.  Marland Kitchen FLUZONE HIGH-DOSE 0.5 ML SUSY FLU SHOT  . Potassium Gluconate 550 MG TABS Take 1 tablet (550 mg total) by mouth daily.  . rosuvastatin (CRESTOR) 10 MG tablet TAKE ONE TABLET BY MOUTH THREE TIMES A WEEK ON MONDAY, WEDNESDAY, AND FRIDAY     Allergies:   Ivp dye [iodinated diagnostic agents]; Polycin [bacitracin-polymyxin b]; and Zithromax [azithromycin]   Social History   Socioeconomic History  . Marital status: Married    Spouse name: Not on file  . Number of children: Not on file  . Years of education: Not on file  . Highest education level: Not on file  Occupational History  . Not on file  Social Needs  . Financial resource strain: Not on file  . Food insecurity:    Worry: Not on file    Inability: Not on file  . Transportation needs:    Medical: Not on file    Non-medical: Not on file  Tobacco Use  . Smoking status: Never Smoker  . Smokeless tobacco: Never Used  Substance and Sexual Activity  . Alcohol use: Never    Frequency: Never  . Drug use: Never  . Sexual activity: Not on file  Lifestyle  . Physical activity:    Days per week: Not on file    Minutes per session: Not on file  . Stress: Not on file  Relationships  . Social connections:    Talks on phone: Not on file    Gets together: Not on file    Attends religious service: Not on file    Active member of club or  organization: Not on file    Attends meetings of clubs or organizations: Not on file    Relationship status: Not on file  Other Topics Concern  . Not on file  Social History Narrative  . Not on file     Family History:  The patient's family history includes Cancer in her father; Hyperlipidemia in her mother; Stroke in her mother.   ROS:   Please see the history of present illness.    Review of Systems  Constitution: Negative.  HENT: Negative.   Eyes: Negative.   Cardiovascular: Positive for irregular heartbeat and palpitations.  Respiratory: Negative.   Hematologic/Lymphatic: Bruises/bleeds easily.  Musculoskeletal: Negative.  Negative for joint pain.  Gastrointestinal: Negative.   Genitourinary: Negative.   Neurological: Negative.    All other systems reviewed and are negative.   PHYSICAL EXAM:   VS:  BP (!) 128/50   Pulse 64   Ht 5' 4.5" (1.638 m)   Wt 120 lb 9.6 oz (54.7 kg)   SpO2 98%   BMI 20.38 kg/m   Physical Exam  GEN: Thin, young looking, in no acute distress  Neck: no JVD, carotid bruits, or masses Cardiac: Irregular irregular; 2/6 systolic murmur left sternal border, 2/6 diastolic murmur at the left sternal border Respiratory:  clear to auscultation bilaterally, normal work of breathing GI: soft, nontender, nondistended, + BS Ext: without cyanosis, clubbing, or edema, Good distal pulses bilaterally Neuro:  Alert and Oriented x 3 Psych: euthymic mood, full affect  Wt Readings from Last 3 Encounters:  12/20/17 120 lb 9.6 oz (54.7 kg)  12/20/16 121 lb 6.4 oz (55.1 kg)  12/17/15 119 lb (54 kg)      Studies/Labs Reviewed:   EKG:  EKG is ordered today.  The ekg ordered today demonstrates atrial fibrillation at 64 bpm with right bundle branch block, nonspecific ST-T wave changes  Recent Labs: No results found for requested labs within last 8760 hours.   Lipid Panel    Component Value Date/Time   CHOL 180 12/20/2016 0841   TRIG 143 12/20/2016 0841    HDL 61 12/20/2016 0841   CHOLHDL 3.0 12/20/2016 0841   CHOLHDL 3.0 12/18/2015 0739   VLDL 23 12/18/2015 0739   LDLCALC 90 12/20/2016 0841    Additional studies/ records that were reviewed today include:  2D echo 2011 normal LV function EF 60 to 65% mild mitral regurgitation, mild aortic valve regurgitation, moderately elevated RV systolic pressure.  Atrial septal aneurysm with bowing from left to right atrium.    ASSESSMENT:    1. Paroxysmal atrial  fibrillation ([place])   2. Essential hypertension   3. Mixed hyperlipidemia      PLAN:  In order of problems listed above:  PAF on diltiazem and aspirin 81 mg.  CHA2DS2-VASc equals 4 for age, female, hypertension-patient has declined anticoagulation in the past.  She is in atrial fibrillation today.  Long discussion with patient concerning risks of stroke.  She says she is willing to try low-dose Eliquis.  Will start 2.5 mg twice daily based on her age and weight less than 60 kg.  Check renal function and CBC today and repeat in 1 month..  Stop aspirin.  Follow-up with Dr. Irish Lack in 4 to 5 months.  Essential hypertension pressure well controlled today.  Usually high when she comes here  Mixed hyperlipidemia on Crestor.  LDL 81 06/22/2017  Medication Adjustments/Labs and Tests Ordered: Current medicines are reviewed at length with the patient today.  Concerns regarding medicines are outlined above.  Medication changes, Labs and Tests ordered today are listed in the Patient Instructions below. There are no Patient Instructions on file for this visit.   Signed, Ermalinda Barrios, PA-C  12/20/2017 1:32 PM    [facility] [address], [place], [place]  [address] Phone: [phone]; Fax: [phone]

## 2017-12-20 NOTE — Patient Instructions (Addendum)
Medication Instructions:  Your physician has recommended you make the following change in your medication:   1. STOP: Aspirin  2. START: eliquis 2.5 mg tablet: Take 1 tablet by mouth twice a day  If you need a refill on your cardiac medications before your next appointment, please call your pharmacy.   Lab work: TODAY: CBC, BMET, DIG  Your physician recommends that you return for a CBC, BMET in 1 MONTH  If you have labs (blood work) drawn today and your tests are completely normal, you will receive your results only by: Marland Kitchen MyChart Message (if you have MyChart) OR . A paper copy in the mail If you have any lab test that is abnormal or we need to change your treatment, we will call you to review the results.  Testing/Procedures: None ordered  Follow-Up: . Follow up with Dr. Irish Lack on 04/26/18 at 9:00 AM  Any Other Special Instructions Will Be Listed Below (If Applicable).

## 2017-12-20 NOTE — Addendum Note (Signed)
Addended by: Drue Novel I on: 12/20/2017 01:58 PM   Modules accepted: Orders

## 2017-12-21 ENCOUNTER — Telehealth: Payer: Self-pay

## 2017-12-21 LAB — CBC
HEMATOCRIT: 41.6 % (ref 34.0–46.6)
HEMOGLOBIN: 13.6 g/dL (ref 11.1–15.9)
MCH: 29.1 pg (ref 26.6–33.0)
MCHC: 32.7 g/dL (ref 31.5–35.7)
MCV: 89 fL (ref 79–97)
Platelets: 321 10*3/uL (ref 150–450)
RBC: 4.68 x10E6/uL (ref 3.77–5.28)
RDW: 13.5 % (ref 12.3–15.4)
WBC: 8 10*3/uL (ref 3.4–10.8)

## 2017-12-21 LAB — BASIC METABOLIC PANEL
BUN/Creatinine Ratio: 17 (ref 12–28)
BUN: 17 mg/dL (ref 8–27)
CALCIUM: 9.5 mg/dL (ref 8.7–10.3)
CHLORIDE: 106 mmol/L (ref 96–106)
CO2: 24 mmol/L (ref 20–29)
Creatinine, Ser: 0.99 mg/dL (ref 0.57–1.00)
GFR calc non Af Amer: 54 mL/min/{1.73_m2} — ABNORMAL LOW (ref >59)
GFR, EST AFRICAN AMERICAN: 62 mL/min/{1.73_m2} (ref >59)
Glucose: 102 mg/dL — ABNORMAL HIGH (ref 65–99)
Potassium: 4 mmol/L (ref 3.5–5.2)
Sodium: 146 mmol/L — ABNORMAL HIGH (ref 134–144)

## 2017-12-21 LAB — DIGOXIN LEVEL: Digoxin, Serum: 1.2 ng/mL — ABNORMAL HIGH (ref 0.5–0.9)

## 2017-12-21 NOTE — Telephone Encounter (Signed)
Left message for patient to call back  

## 2017-12-21 NOTE — Telephone Encounter (Signed)
-----   Message from Imogene Burn, PA-C sent at 12/21/2017 12:35 PM EDT ----- Digoxin level a little high.  Decrease digoxin to 0.125 mg daily or 125 mcg daily.  Other labs stable

## 2017-12-22 MED ORDER — DIGOXIN 125 MCG PO TABS: 125.0000 ug | ORAL_TABLET | Freq: Every day | ORAL | 3 refills | Status: DC

## 2017-12-22 NOTE — Telephone Encounter (Signed)
The patient has been notified of the result and recommendations to decrease digoxin to 0.125 mg QD. Patient verbalized understanding. Rx sent to preferred pharmacy. All questions (if any) were answered. Cleon Gustin, RN 12/22/2017 9:16 AM

## 2017-12-22 NOTE — Telephone Encounter (Signed)
°  Please return call with lab results 

## 2018-01-16 ENCOUNTER — Ambulatory Visit: Payer: Medicare Other | Admitting: Family Medicine

## 2018-01-16 ENCOUNTER — Encounter: Payer: Self-pay | Admitting: Family Medicine

## 2018-01-16 ENCOUNTER — Other Ambulatory Visit: Payer: Self-pay

## 2018-01-16 VITALS — BP 153/82 | HR 72 | Temp 98.1°F | Resp 18 | Ht 64.5 in | Wt 119.6 lb

## 2018-01-16 DIAGNOSIS — I499 Cardiac arrhythmia, unspecified: Secondary | ICD-10-CM

## 2018-01-16 DIAGNOSIS — J329 Chronic sinusitis, unspecified: Secondary | ICD-10-CM

## 2018-01-16 MED ORDER — CEFDINIR 300 MG PO CAPS: ORAL_CAPSULE | ORAL | 0 refills | Status: DC

## 2018-01-16 MED ORDER — FLUTICASONE PROPIONATE 50 MCG/ACT NA SUSP: NASAL | 2 refills | Status: AC

## 2018-01-16 NOTE — Patient Instructions (Addendum)
   Use the fluticasone spray twice daily for the next 5 days to try and ensure the eustachian tube stay open and you do not get additional ear infection.  Take the Omnicef (cefdinir) 1 twice daily for antibiotic for the sinuses  Drink plenty of fluids to stay well-hydrated which helps keep the secretions thinner.  Return if worse  If you have lab work done today you will be contacted with your lab results within the next 2 weeks.  If you have not heard from [place] then please contact us. The fastest way to get your results is to register for My Chart.   IF you received an x-ray today, you will receive an invoice from [facility]. Please contact [facility] at [phone] with questions or concerns regarding your invoice.   IF you received labwork today, you will receive an invoice from [facility]. Please contact [facility] at [phone] with questions or concerns regarding your invoice.   Our billing staff will not be able to assist you with questions regarding bills from these companies.  You will be contacted with the lab results as soon as they are available. The fastest way to get your results is to activate your My Chart account. Instructions are located on the last page of this paperwork. If you have not heard from [place] regarding the results in 2 weeks, please contact this office.

## 2018-01-16 NOTE — Progress Notes (Signed)
Patient ID: Melissa Randolph, female    DOB: 1934/12/29  Age: 83 y.o. MRN: [id number]  Chief Complaint  Patient presents with  . Sinusitis    Subjective:   83 year old lady who is here with a history of 5 or more days of having sinus symptoms.  It started with her left side with the maxillary area seeming to drain purulent mucus, and today is now on the right also.  She has not been particularly ill.  She is doing well otherwise.  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  She has not been coughing much.  She does not smoke.  She is married, but her husband is not ill.  He gets this a time or 2 a year, and was last treated early in the year at another practice with doxycycline.  Current allergies, medications, problem list, past/family and social histories reviewed.  Objective:  BP (!) 153/82   Pulse 72   Temp 98.1 F (36.7 C) (Oral)   Resp 18   Ht 5' 4.5" (1.638 m)   Wt 119 lb 9.6 oz (54.3 kg)   SpO2 99%   BMI 20.21 kg/m   Pleasant lady in no major acute distress.  Blood pressure is a little high, but she brought in the list of readings showing that it is pretty consistently very good at home.  TMs are normal-appearing.  Throat clear without erythema.  A little stuffy in her nose but no major sinus tenderness.  Chest is clear to auscultation.  Heart has frequent ectopy, may be in atrial fibrillation again though a rhythm strip would be needed since she apparently has a history of a lot of PVCs and PACs.  Assessment & Plan:   Assessment: 1. Rhinosinusitis   2. Cardiac arrhythmia, unspecified cardiac arrhythmia type       Plan: See instructions  No orders of the defined types were placed in this encounter.   Meds ordered this encounter  Medications  . cefdinir (OMNICEF) 300 MG capsule    Sig: Take one pill twice daily for sinus infection    Dispense:  20 capsule    Refill:  0  . fluticasone (FLONASE) 50 MCG/ACT  nasal spray    Sig: Use each nostril twice daily for 5 days, then once daily.    Dispense:  16 g    Refill:  2         Patient Instructions     Use the fluticasone spray twice daily for the next 5 days to try and ensure the eustachian tube stay open and you do not get additional ear infection.  Take the Omnicef (cefdinir) 1 twice daily for antibiotic for the sinuses  Drink plenty of fluids to stay well-hydrated which helps keep the secretions thinner.  Return if worse  If you have lab work done today you will be contacted with your lab results within the next 2 weeks.  If you have not heard from [place] then please contact us. The fastest way to get your results is to register for My Chart.   IF you received an x-ray today, you will receive an invoice from [facility]. Please contact [facility] at [phone] with questions or concerns regarding your invoice.   IF you received labwork today, you will receive an invoice from [facility]. Please contact [facility] at [phone] with questions or concerns regarding your invoice.   Our billing staff will not be able to  assist you with questions regarding bills from these companies.  You will be contacted with the lab results as soon as they are available. The fastest way to get your results is to activate your My Chart account. Instructions are located on the last page of this paperwork. If you have not heard from [place] regarding the results in 2 weeks, please contact this office.        Return if symptoms worsen or fail to improve.   Ruben Reason, MD 01/16/2018

## 2018-01-23 ENCOUNTER — Other Ambulatory Visit: Payer: Medicare Other | Admitting: *Deleted

## 2018-01-23 ENCOUNTER — Telehealth: Payer: Self-pay

## 2018-01-23 DIAGNOSIS — I480 Paroxysmal atrial fibrillation: Secondary | ICD-10-CM

## 2018-01-23 DIAGNOSIS — E782 Mixed hyperlipidemia: Secondary | ICD-10-CM

## 2018-01-23 DIAGNOSIS — R7889 Finding of other specified substances, not normally found in blood: Secondary | ICD-10-CM

## 2018-01-23 DIAGNOSIS — I10 Essential (primary) hypertension: Secondary | ICD-10-CM

## 2018-01-23 LAB — CBC
Hematocrit: 40.3 % (ref 34.0–46.6)
Hemoglobin: 12.9 g/dL (ref 11.1–15.9)
MCH: 28.2 pg (ref 26.6–33.0)
MCHC: 32 g/dL (ref 31.5–35.7)
MCV: 88 fL (ref 79–97)
PLATELETS: 314 10*3/uL (ref 150–450)
RBC: 4.58 x10E6/uL (ref 3.77–5.28)
RDW: 13.6 % (ref 12.3–15.4)
WBC: 8.6 10*3/uL (ref 3.4–10.8)

## 2018-01-23 LAB — BASIC METABOLIC PANEL
BUN / CREAT RATIO: 18 (ref 12–28)
BUN: 16 mg/dL (ref 8–27)
CALCIUM: 9.3 mg/dL (ref 8.7–10.3)
CHLORIDE: 103 mmol/L (ref 96–106)
CO2: 24 mmol/L (ref 20–29)
Creatinine, Ser: 0.88 mg/dL (ref 0.57–1.00)
GFR calc Af Amer: 71 mL/min/{1.73_m2} (ref >59)
GFR calc non Af Amer: 62 mL/min/{1.73_m2} (ref >59)
GLUCOSE: 129 mg/dL — AB (ref 65–99)
POTASSIUM: 3.8 mmol/L (ref 3.5–5.2)
Sodium: 143 mmol/L (ref 134–144)

## 2018-01-23 NOTE — Telephone Encounter (Signed)
-----   Message from Imogene Burn, PA-C sent at 01/23/2018  3:56 PM EST ----- Renal function normal and CBC normal.  Glucose slightly elevated 129.

## 2018-01-23 NOTE — Telephone Encounter (Signed)
Patient made aware of lab results. Patient verbalized understanding. Patient requesting that digoxin level be rechecked since we recently decreased her digoxin. Discussed with Melissa Barrios, PA and okay to order. Spoke with Katrina in the lab and we will add to lab sample from today. Order placed and Katrina will fax over to add onto specimen. Patient made aware that we will add to sample drawn from today.

## 2018-01-25 LAB — DIGOXIN LEVEL: Digoxin, Serum: 0.5 ng/mL (ref 0.5–0.9)

## 2018-01-25 LAB — SPECIMEN STATUS REPORT

## 2018-02-07 ENCOUNTER — Other Ambulatory Visit: Payer: Self-pay | Admitting: Interventional Cardiology

## 2018-02-07 DIAGNOSIS — I480 Paroxysmal atrial fibrillation: Secondary | ICD-10-CM

## 2018-02-07 MED ORDER — DILTIAZEM HCL 60 MG PO TABS: 60.0000 mg | ORAL_TABLET | Freq: Every day | ORAL | 2 refills | Status: DC | PRN

## 2018-03-17 ENCOUNTER — Other Ambulatory Visit: Payer: Self-pay

## 2018-03-17 ENCOUNTER — Ambulatory Visit (INDEPENDENT_AMBULATORY_CARE_PROVIDER_SITE_OTHER): Payer: Medicare Other | Admitting: Osteopathic Medicine

## 2018-03-17 ENCOUNTER — Encounter: Payer: Self-pay | Admitting: Osteopathic Medicine

## 2018-03-17 DIAGNOSIS — J329 Chronic sinusitis, unspecified: Secondary | ICD-10-CM | POA: Diagnosis not present

## 2018-03-17 DIAGNOSIS — J310 Chronic rhinitis: Secondary | ICD-10-CM

## 2018-03-17 MED ORDER — CEFDINIR 300 MG PO CAPS: ORAL_CAPSULE | ORAL | 0 refills | Status: DC

## 2018-03-17 NOTE — Patient Instructions (Addendum)
Sinusitis, Adult Sinusitis is inflammation of your sinuses. Sinuses are hollow spaces in the bones around your face. Your sinuses are located:  Around your eyes.  In the middle of your forehead.  Behind your nose.  In your cheekbones. Mucus normally drains out of your sinuses. When your nasal tissues become inflamed or swollen, mucus can become trapped or blocked. This allows bacteria, viruses, and fungi to grow, which leads to infection. Most infections of the sinuses are caused by a virus. Sinusitis can develop quickly. It can last for up to 4 weeks (acute) or for more than 12 weeks (chronic). Sinusitis often develops after a cold. What are the causes? This condition is caused by anything that creates swelling in the sinuses or stops mucus from draining. This includes:  Allergies.  Asthma.  Infection from bacteria or viruses.  Deformities or blockages in your nose or sinuses.  Abnormal growths in the nose (nasal polyps).  Pollutants, such as chemicals or irritants in the air.  Infection from fungi (rare). What increases the risk? You are more likely to develop this condition if you:  Have a weak body defense system (immune system).  Do a lot of swimming or diving.  Overuse nasal sprays.  Smoke. What are the signs or symptoms? The main symptoms of this condition are pain and a feeling of pressure around the affected sinuses. Other symptoms include:  Stuffy nose or congestion.  Thick drainage from your nose.  Swelling and warmth over the affected sinuses.  Headache.  Upper toothache.  A cough that may get worse at night.  Extra mucus that collects in the throat or the back of the nose (postnasal drip).  Decreased sense of smell and taste.  Fatigue.  A fever.  Sore throat.  Bad breath. How is this diagnosed? This condition is diagnosed based on:  Your symptoms.  Your medical history.  A physical exam.  Tests to find out if your condition is  acute or chronic. This may include: ? Checking your nose for nasal polyps. ? Viewing your sinuses using a device that has a light (endoscope). ? Testing for allergies or bacteria. ? Imaging tests, such as an MRI or CT scan. In rare cases, a bone biopsy may be done to rule out more serious types of fungal sinus disease. How is this treated? Treatment for sinusitis depends on the cause and whether your condition is chronic or acute.  If caused by a virus, your symptoms should go away on their own within 10 days. You may be given medicines to relieve symptoms. They include: ? Medicines that shrink swollen nasal passages (topical intranasal decongestants). ? Medicines that treat allergies (antihistamines). ? A spray that eases inflammation of the nostrils (topical intranasal corticosteroids). ? Rinses that help get rid of thick mucus in your nose (nasal saline washes).  If caused by bacteria, your health care provider may recommend waiting to see if your symptoms improve. Most bacterial infections will get better without antibiotic medicine. You may be given antibiotics if you have: ? A severe infection. ? A weak immune system.  If caused by narrow nasal passages or nasal polyps, you may need to have surgery. Follow these instructions at home: Medicines  Take, use, or apply over-the-counter and prescription medicines only as told by your health care provider. These may include nasal sprays.  If you were prescribed an antibiotic medicine, take it as told by your health care provider. Do not stop taking the antibiotic even if you start   to feel better. Hydrate and humidify  Drink enough fluid to keep your urine pale yellow. Staying hydrated will help to thin your mucus.  Use a cool mist humidifier to keep the humidity level in your home above 50%.  Inhale steam for 10-15 minutes, 3-4 times a day, or as told by your health care provider. You can do this in the bathroom while a hot shower is  running.  Limit your exposure to cool or dry air. Rest  Rest as much as possible.  Sleep with your head raised (elevated).  Make sure you get enough sleep each night. General instructions   Apply a warm, moist washcloth to your face 3-4 times a day or as told by your health care provider. This will help with discomfort.  Wash your hands often with soap and water to reduce your exposure to germs. If soap and water are not available, use hand sanitizer.  Do not smoke. Avoid being around people who are smoking (secondhand smoke).  Keep all follow-up visits as told by your health care provider. This is important. Contact a health care provider if:  You have a fever.  Your symptoms get worse.  Your symptoms do not improve within 10 days. Get help right away if:  You have a severe headache.  You have persistent vomiting.  You have severe pain or swelling around your face or eyes.  You have vision problems.  You develop confusion.  Your neck is stiff.  You have trouble breathing. Summary  Sinusitis is soreness and inflammation of your sinuses. Sinuses are hollow spaces in the bones around your face.  This condition is caused by nasal tissues that become inflamed or swollen. The swelling traps or blocks the flow of mucus. This allows bacteria, viruses, and fungi to grow, which leads to infection.  If you were prescribed an antibiotic medicine, take it as told by your health care provider. Do not stop taking the antibiotic even if you start to feel better.  Keep all follow-up visits as told by your health care provider. This is important. This information is not intended to replace advice given to you by your health care provider. Make sure you discuss any questions you have with your health care provider. Document Released: 02/08/2005 Document Revised: 07/11/2017 Document Reviewed: 07/11/2017 Elsevier Interactive Patient Education  [employer].     If you have  lab work done today you will be contacted with your lab results within the next 2 weeks.  If you have not heard from [place] then please contact us. The fastest way to get your results is to register for My Chart.   IF you received an x-ray today, you will receive an invoice from [facility]. Please contact [facility] at [phone] with questions or concerns regarding your invoice.   IF you received labwork today, you will receive an invoice from [facility]. Please contact [facility] at [phone] with questions or concerns regarding your invoice.   Our billing staff will not be able to assist you with questions regarding bills from these companies.  You will be contacted with the lab results as soon as they are available. The fastest way to get your results is to activate your My Chart account. Instructions are located on the last page of this paperwork. If you have not heard from [place] regarding the results in 2 weeks, please contact this office.

## 2018-03-17 NOTE — Progress Notes (Signed)
HPI: Melissa Randolph is a 84 y.o. female who  has a past medical history of Allergy, Asthma, Atrial fibrillation ([place]), Heart murmur, HTN (hypertension), Hypercholesteremia, Mixed hyperlipidemia, and Palpitation.  she presents to [facility] today, 03/17/18,  for chief complaint of: Chief Complaint  Patient presents with  . Sinusitis       Last week Saturday 03/11/18 (6 days ago) went to [place] w/ 1 day of runny nose and fever after a week of sinus congestion, tested negative for flu and was told she had viral infection. Through weekend, reported fever and sore throat, which has resolved as of Tuesday, 3 days ago. Pt is here for second opinion since her sinus congestion is still present, she was reportedly told by [facility] that she doesn't need an antibiotic because she's not having pain.      Past medical, surgical, social and family history reviewed:  Patient Active Problem List   Diagnosis Date Noted  . Elevated blood pressure reading 12/20/2016  . Atrial fibrillation ([place])   . Mixed hyperlipidemia   . HTN (hypertension)     Past Surgical History:  Procedure Laterality Date  . BREAST SURGERY      Social History   Tobacco Use  . Smoking status: Never Smoker  . Smokeless tobacco: Never Used  Substance Use Topics  . Alcohol use: Never    Frequency: Never    Family History  Problem Relation Age of Onset  . Hyperlipidemia Mother   . Stroke Mother   . Cancer Father   . Heart attack Neg Hx   . Hypertension Neg Hx      Current medication list and allergy/intolerance information reviewed:    Current Outpatient Medications  Medication Sig Dispense Refill  . apixaban (ELIQUIS) 2.5 MG TABS tablet Take 1 tablet (2.5 mg total) by mouth 2 (two) times daily. 60 tablet 11  . Ascorbic Acid (VITAMIN C PO) Take 1 tablet by mouth daily. 400 mg    . azelastine (ASTELIN) 0.1 % nasal spray Place 1 spray into both nostrils 2 (two) times daily. Use in each nostril  as directed    . CARTIA XT 180 MG 24 hr capsule TAKE 1 CAPSULE(S) BY MOUTH TWO TIMES A DAY 180 capsule 2  . cholecalciferol (VITAMIN D) 400 UNITS TABS tablet Take 1,000 Units by mouth daily.     . digoxin (LANOXIN) 0.125 MG tablet Take 1 tablet (125 mcg total) by mouth daily. 90 tablet 3  . fexofenadine (ALLEGRA) 60 MG tablet Take 60 mg by mouth daily as needed for allergies or rhinitis.    . Fish Oil OIL Take 100 mg by mouth daily.     . fluticasone (FLONASE) 50 MCG/ACT nasal spray Use each nostril twice daily for 5 days, then once daily. 16 g 2  . fluticasone (FLOVENT HFA) 44 MCG/ACT inhaler Inhale 1 puff into the lungs 2 (two) times daily.    Marland Kitchen FLUZONE HIGH-DOSE 0.5 ML SUSY FLU SHOT    . Potassium Gluconate 550 MG TABS Take 1 tablet (550 mg total) by mouth daily. 93 tablet 3  . rosuvastatin (CRESTOR) 10 MG tablet TAKE ONE TABLET BY MOUTH THREE TIMES PER WEEK ON MONDAY, WEDNESDAY, AND FRIDAY 36 tablet 2  . cefdinir (OMNICEF) 300 MG capsule Take one pill twice daily for sinus infection 20 capsule 0  . diclofenac sodium (VOLTAREN) 1 % GEL Apply 2 g topically 4 (four) times daily as needed (pain).    Marland Kitchen diltiazem (  CARDIZEM) 60 MG tablet Take 1 tablet (60 mg total) by mouth daily as needed (Take as directed). (Patient not taking: Reported on 03/17/2018) 90 tablet 2   No current facility-administered medications for this visit.     Allergies  Allergen Reactions  . Ivp Dye [Iodinated Diagnostic Agents] Other (See Comments)    Unknown  . Polycin [Bacitracin-Polymyxin B] Other (See Comments)    Unknown  . Zithromax [Azithromycin] Other (See Comments)    Unknown      Review of Systems:  Constitutional:  No  fever, no chills, +recent illness, No unintentional weight changes. No significant fatigue.   HEENT: No  headache, no vision change, no hearing change, +sore throat, +sinus pressure  Cardiac: No  chest pain, No  pressure, No palpitations  Respiratory:  No  shortness of breath. No   Cough  Gastrointestinal: No  abdominal pain, No  nausea, No  vomiting,  No  blood in stool, No  diarrhea  Musculoskeletal: No new myalgia/arthralgia  Skin: No  Rash  Neurologic: No  weakness, No  dizziness  Exam:  BP 132/86 (BP Location: Left Arm, Patient Position: Sitting, Cuff Size: Normal)   Pulse 75   Temp 98.4 F (36.9 C) (Oral)   Resp 17   Ht 5' 4.5" (1.638 m)   Wt 121 lb 3.2 oz (55 kg)   SpO2 98%   BMI 20.48 kg/m   Constitutional: VS see above. General Appearance: alert, well-developed, well-nourished, NAD  Eyes: Normal lids and conjunctive, non-icteric sclera  Ears, Nose, Mouth, Throat: MMM, Normal external inspection ears/nares/mouth/lips/gums. TM normal bilaterally. Pharynx/tonsils no erythema, no exudate. Nasal mucosa congested.   Neck: No masses, trachea midline. No tenderness/mass appreciated. No lymphadenopathy  Respiratory: Normal respiratory effort. no wheeze, no rhonchi, no rales  Cardiovascular: S1/S2 normal, no murmur, no rub/gallop auscultated. RRR. No lower extremity edema.   Gastrointestinal: Nontender, no masses. Bowel sounds normal.  Musculoskeletal: Gait normal.   Neurological: Normal balance/coordination. No tremor.   Skin: warm, dry, intact.   Psychiatric: Normal judgment/insight. Normal mood and affect.    ASSESSMENT/PLAN:   Rhinosinusitis - Plan: cefdinir (OMNICEF) 300 MG capsule    Patient Instructions   Sinusitis, Adult Sinusitis is inflammation of your sinuses. Sinuses are hollow spaces in the bones around your face. Your sinuses are located:  Around your eyes.  In the middle of your forehead.  Behind your nose.  In your cheekbones. Mucus normally drains out of your sinuses. When your nasal tissues become inflamed or swollen, mucus can become trapped or blocked. This allows bacteria, viruses, and fungi to grow, which leads to infection. Most infections of the sinuses are caused by a virus. Sinusitis can develop quickly.  It can last for up to 4 weeks (acute) or for more than 12 weeks (chronic). Sinusitis often develops after a cold. What are the causes? This condition is caused by anything that creates swelling in the sinuses or stops mucus from draining. This includes:  Allergies.  Asthma.  Infection from bacteria or viruses.  Deformities or blockages in your nose or sinuses.  Abnormal growths in the nose (nasal polyps).  Pollutants, such as chemicals or irritants in the air.  Infection from fungi (rare). What increases the risk? You are more likely to develop this condition if you:  Have a weak body defense system (immune system).  Do a lot of swimming or diving.  Overuse nasal sprays.  Smoke. What are the signs or symptoms? The main symptoms of this condition are  pain and a feeling of pressure around the affected sinuses. Other symptoms include:  Stuffy nose or congestion.  Thick drainage from your nose.  Swelling and warmth over the affected sinuses.  Headache.  Upper toothache.  A cough that may get worse at night.  Extra mucus that collects in the throat or the back of the nose (postnasal drip).  Decreased sense of smell and taste.  Fatigue.  A fever.  Sore throat.  Bad breath. How is this diagnosed? This condition is diagnosed based on:  Your symptoms.  Your medical history.  A physical exam.  Tests to find out if your condition is acute or chronic. This may include: ? Checking your nose for nasal polyps. ? Viewing your sinuses using a device that has a light (endoscope). ? Testing for allergies or bacteria. ? Imaging tests, such as an MRI or CT scan. In rare cases, a bone biopsy may be done to rule out more serious types of fungal sinus disease. How is this treated? Treatment for sinusitis depends on the cause and whether your condition is chronic or acute.  If caused by a virus, your symptoms should go away on their own within 10 days. You may be given  medicines to relieve symptoms. They include: ? Medicines that shrink swollen nasal passages (topical intranasal decongestants). ? Medicines that treat allergies (antihistamines). ? A spray that eases inflammation of the nostrils (topical intranasal corticosteroids). ? Rinses that help get rid of thick mucus in your nose (nasal saline washes).  If caused by bacteria, your health care provider may recommend waiting to see if your symptoms improve. Most bacterial infections will get better without antibiotic medicine. You may be given antibiotics if you have: ? A severe infection. ? A weak immune system.  If caused by narrow nasal passages or nasal polyps, you may need to have surgery. Follow these instructions at home: Medicines  Take, use, or apply over-the-counter and prescription medicines only as told by your health care provider. These may include nasal sprays.  If you were prescribed an antibiotic medicine, take it as told by your health care provider. Do not stop taking the antibiotic even if you start to feel better. Hydrate and humidify  Drink enough fluid to keep your urine pale yellow. Staying hydrated will help to thin your mucus.  Use a cool mist humidifier to keep the humidity level in your home above 50%.  Inhale steam for 10-15 minutes, 3-4 times a day, or as told by your health care provider. You can do this in the bathroom while a hot shower is running.  Limit your exposure to cool or dry air. Rest  Rest as much as possible.  Sleep with your head raised (elevated).  Make sure you get enough sleep each night. General instructions   Apply a warm, moist washcloth to your face 3-4 times a day or as told by your health care provider. This will help with discomfort.  Wash your hands often with soap and water to reduce your exposure to germs. If soap and water are not available, use hand sanitizer.  Do not smoke. Avoid being around people who are smoking (secondhand  smoke).  Keep all follow-up visits as told by your health care provider. This is important. Contact a health care provider if:  You have a fever.  Your symptoms get worse.  Your symptoms do not improve within 10 days. Get help right away if:  You have a severe headache.  You have  persistent vomiting.  You have severe pain or swelling around your face or eyes.  You have vision problems.  You develop confusion.  Your neck is stiff.  You have trouble breathing. Summary  Sinusitis is soreness and inflammation of your sinuses. Sinuses are hollow spaces in the bones around your face.  This condition is caused by nasal tissues that become inflamed or swollen. The swelling traps or blocks the flow of mucus. This allows bacteria, viruses, and fungi to grow, which leads to infection.  If you were prescribed an antibiotic medicine, take it as told by your health care provider. Do not stop taking the antibiotic even if you start to feel better.  Keep all follow-up visits as told by your health care provider. This is important. This information is not intended to replace advice given to you by your health care provider. Make sure you discuss any questions you have with your health care provider. Document Released: 02/08/2005 Document Revised: 07/11/2017 Document Reviewed: 07/11/2017 Elsevier Interactive Patient Education  [employer].     If you have lab work done today you will be contacted with your lab results within the next 2 weeks.  If you have not heard from [place] then please contact us. The fastest way to get your results is to register for My Chart.   IF you received an x-ray today, you will receive an invoice from [facility]. Please contact [facility] at [phone] with questions or concerns regarding your invoice.   IF you received labwork today, you will receive an invoice from [facility]. Please contact [facility] at [phone] with questions or  concerns regarding your invoice.   Our billing staff will not be able to assist you with questions regarding bills from these companies.  You will be contacted with the lab results as soon as they are available. The fastest way to get your results is to activate your My Chart account. Instructions are located on the last page of this paperwork. If you have not heard from [place] regarding the results in 2 weeks, please contact this office.        Visit summary with medication list and pertinent instructions was printed for patient to review. All questions at time of visit were answered - patient instructed to contact office with any additional concerns. ER/RTC precautions were reviewed with the patient.   Follow-up plan: Return if symptoms worsen or fail to improve.  Total time spent: 25 mins, > half face to face counseling and coordinating care for dx listed in AP  Please note: voice recognition software was used to produce this document, and typos may escape review. Please contact Dr. Sheppard Coil for any needed clarifications.

## 2018-04-18 ENCOUNTER — Telehealth: Payer: Self-pay | Admitting: Interventional Cardiology

## 2018-04-18 NOTE — Telephone Encounter (Signed)
Accidentally closed

## 2018-04-18 NOTE — Telephone Encounter (Signed)
Called the patient and made her aware that the voltaren cream would not interact with her meds as long as it was topical and not ingested. Patient verbalized understanding and thanked me for the call.

## 2018-04-18 NOTE — Telephone Encounter (Signed)
New Message   PT is calling because she is wondering if she can use a cream Voroltarin for arm pain, she said she pulled a muscle. She wants to know if it is ok to use the cream while on Eliquis. PT said you can leave voicemail on answering machine if she doesn't answer.   Please call back

## 2018-04-25 NOTE — Progress Notes (Signed)
Cardiology Office Note   Date:  04/26/2018   ID:  Melissa Randolph, DOB 1934-04-21, MRN [id number]  PCP:  Antony Contras, MD    No chief complaint on file.  AFib  Wt Readings from Last 3 Encounters:  04/26/18 118 lb 9.6 oz (53.8 kg)  03/17/18 121 lb 3.2 oz (55 kg)  01/16/18 119 lb 9.6 oz (54.3 kg)       History of Present Illness: Melissa Randolph is a 84 y.o. female  who has had multiple atrial arrhythmias including atrial fibrillation. Sx have ben controlled on [place]. In the past, he has had some palpitations during stressful days, while wearing monitor in 2013, as she was not taking the usual extra diltiazem. She had AFib 3% of the time.   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  She has declined anticoagulation in the past.   Was found to be in AFib.  Agreed to low dose Eliquis in 10/19.   Still uses prn Diltiazem.    Denies : Chest pain. Dizziness. Leg edema. Nitroglycerin use. Orthopnea. Palpitations. Paroxysmal nocturnal dyspnea. Shortness of breath. Syncope.   She lost weight intentionally.     Past Medical History:  Diagnosis Date  . Allergy   . Asthma   . Atrial fibrillation ([place])   . Heart murmur   . HTN (hypertension)   . Hypercholesteremia   . Mixed hyperlipidemia   . Palpitation     Past Surgical History:  Procedure Laterality Date  . BREAST SURGERY       Current Outpatient Medications  Medication Sig Dispense Refill  . apixaban (ELIQUIS) 2.5 MG TABS tablet Take 1 tablet (2.5 mg total) by mouth 2 (two) times daily. 60 tablet 11  . Ascorbic Acid (VITAMIN C PO) Take 1 tablet by mouth daily. 400 mg    . azelastine (ASTELIN) 0.1 % nasal spray Place 1 spray into both nostrils 2 (two) times daily. Use in each nostril as directed    . CARTIA XT 180 MG 24 hr capsule TAKE 1 CAPSULE(S) BY MOUTH TWO TIMES A DAY 180 capsule 2  . cholecalciferol  (VITAMIN D) 400 UNITS TABS tablet Take 1,000 Units by mouth daily.     . diclofenac sodium (VOLTAREN) 1 % GEL Apply 2 g topically 4 (four) times daily as needed (pain).    Marland Kitchen digoxin (LANOXIN) 0.125 MG tablet Take 1 tablet (125 mcg total) by mouth daily. 90 tablet 3  . diltiazem (CARDIZEM) 60 MG tablet Take 1 tablet (60 mg total) by mouth daily as needed (Take as directed). 90 tablet 2  . fexofenadine (ALLEGRA) 60 MG tablet Take 60 mg by mouth daily as needed for allergies or rhinitis.    . Fish Oil OIL Take 100 mg by mouth daily.     . fluticasone (FLONASE) 50 MCG/ACT nasal spray Use each nostril twice daily for 5 days, then once daily. 16 g 2  . fluticasone (FLOVENT HFA) 44 MCG/ACT inhaler Inhale 1 puff into the lungs 2 (two) times daily.    Marland Kitchen FLUZONE HIGH-DOSE 0.5 ML SUSY FLU SHOT    . Potassium Gluconate 550 MG TABS Take 1 tablet (550 mg total) by mouth daily. 93 tablet 3  . rosuvastatin (CRESTOR) 10 MG tablet TAKE ONE TABLET BY MOUTH THREE TIMES PER WEEK ON MONDAY, WEDNESDAY, AND FRIDAY 36 tablet 2   No current facility-administered medications for this  visit.     Allergies:   Ivp dye [iodinated diagnostic agents]; Polycin [bacitracin-polymyxin b]; and Zithromax [azithromycin]    Social History:  The patient  reports that she has never smoked. She has never used smokeless tobacco. She reports that she does not drink alcohol or use drugs.   Family History:  The patient's family history includes Cancer in her father; Hyperlipidemia in her mother; Stroke in her mother.    ROS:  Please see the history of present illness.   Otherwise, review of systems are positive for joint pains.   All other systems are reviewed and negative.    PHYSICAL EXAM: VS:  BP 126/60   Pulse 86   Ht 5' 4.5" (1.638 m)   Wt 118 lb 9.6 oz (53.8 kg)   SpO2 98%   BMI 20.04 kg/m  , BMI Body mass index is 20.04 kg/m. GEN: Well nourished, well developed, in no acute distress  HEENT: normal  Neck: no JVD, carotid  bruits, or masses Cardiac: irregularly irregular; no murmurs, rubs, or gallops,no edema  Respiratory:  clear to auscultation bilaterally, normal work of breathing GI: soft, nontender, nondistended, + BS MS: no deformity or atrophy  Skin: warm and dry, no rash Neuro:  Strength and sensation are intact Psych: euthymic mood, full affect   EKG:   The ekg ordered 10/19 demonstrates AFib, rate controlled   Recent Labs: 01/23/2018: BUN 16; Creatinine, Ser 0.88; Hemoglobin 12.9; Platelets 314; Potassium 3.8; Sodium 143   Lipid Panel    Component Value Date/Time   CHOL 180 12/20/2016 0841   TRIG 143 12/20/2016 0841   HDL 61 12/20/2016 0841   CHOLHDL 3.0 12/20/2016 0841   CHOLHDL 3.0 12/18/2015 0739   VLDL 23 12/18/2015 0739   LDLCALC 90 12/20/2016 0841     Other studies Reviewed: Additional studies/ records that were reviewed today with results demonstrating: Normal EF in 2011.   ASSESSMENT AND PLAN:  1. PAF: Rate controlled.  Eliquis for stroke prevention. AFib is more frequent.  Eliquis labs have been checked in late 2019. 2. Hyperlipidemia: LDL 81  In 5/19.  TO be checked with Dr. Moreen Fowler.  3. Palpitations: COntrolled.  4. High BP readings in the MDs office: COntrolled today. 5. Right arm pain: she pulled a muscle and wants to have some more voltaren gel.  SHe asked [place] to refill it.  Will refill today x1.  Current medicines are reviewed at length with the patient today.  The patient concerns regarding her medicines were addressed.  The following changes have been made:  No change  Labs/ tests ordered today include:  No orders of the defined types were placed in this encounter.   Recommend 150 minutes/week of aerobic exercise Low fat, low carb, high fiber diet recommended  Disposition:   FU in 1 year   Signed, Larae Grooms, MD  04/26/2018 9:24 AM    [facility] [address], [place], [place]  [address] Phone: [phone]; Fax: [phone]

## 2018-04-26 ENCOUNTER — Ambulatory Visit: Payer: Medicare Other | Admitting: Interventional Cardiology

## 2018-04-26 ENCOUNTER — Encounter (INDEPENDENT_AMBULATORY_CARE_PROVIDER_SITE_OTHER): Payer: Self-pay

## 2018-04-26 ENCOUNTER — Encounter: Payer: Self-pay | Admitting: Interventional Cardiology

## 2018-04-26 VITALS — BP 126/60 | HR 86 | Ht 64.5 in | Wt 118.6 lb

## 2018-04-26 DIAGNOSIS — E782 Mixed hyperlipidemia: Secondary | ICD-10-CM | POA: Diagnosis not present

## 2018-04-26 DIAGNOSIS — R030 Elevated blood-pressure reading, without diagnosis of hypertension: Secondary | ICD-10-CM

## 2018-04-26 DIAGNOSIS — I480 Paroxysmal atrial fibrillation: Secondary | ICD-10-CM

## 2018-04-26 DIAGNOSIS — R002 Palpitations: Secondary | ICD-10-CM | POA: Diagnosis not present

## 2018-04-26 MED ORDER — DICLOFENAC SODIUM 1 % TD GEL: 2.0000 g | Freq: Four times a day (QID) | TRANSDERMAL | 0 refills | Status: DC | PRN

## 2018-04-26 NOTE — Patient Instructions (Signed)

## 2018-06-13 ENCOUNTER — Telehealth: Payer: Self-pay | Admitting: Interventional Cardiology

## 2018-06-13 NOTE — Telephone Encounter (Signed)
  Patient has a pulled her deltoid muscle in her right arm and she would like to know if she can use CBD ointment on her arm? She wanted to make sure with the medications she takes.

## 2018-06-14 NOTE — Telephone Encounter (Signed)
Agree topical application should be fine with Eliquis. No additional recommendations at this time.

## 2018-06-14 NOTE — Telephone Encounter (Signed)
Patient wanting to know if she can use CBD ointment to rub on her deltoid muscle that she pulled. Patient states that it would not be ingested that she was going to just rub it on externally. Made patient aware that this should be fine. Patient was concerned because she is on Eliquis. Made her aware that I will forward to the PharmD and would call her back if there were different recommendations.

## 2018-08-03 ENCOUNTER — Other Ambulatory Visit: Payer: Self-pay | Admitting: Interventional Cardiology

## 2018-09-29 ENCOUNTER — Telehealth: Payer: Self-pay

## 2018-09-29 NOTE — Telephone Encounter (Signed)
Patient Melissa Randolph requested a week worth of Eliquis 2.5mg  samples. She would also like to apply for the patient assistant program. She said that her insurance company covers 75% and that at this moment, she is in a donut hole with her insurance. Can a staff member please reach oout to her to help her apply. Paper work is ready for the patient to pick up with the samples.

## 2018-10-02 NOTE — Telephone Encounter (Signed)
I left a message on the pts VM asking her to call me back. 

## 2018-10-02 NOTE — Telephone Encounter (Signed)
Patient is returning call.  °

## 2018-10-02 NOTE — Telephone Encounter (Signed)
**Note De-Identified Melissa Randolph Obfuscation** The pt is advised that we are leaving her 2 bottles of Eliquis 2.5 mg samples and a [employer] pt asst application in the downstairs lobby of the [employer] office located on [address] in [place].  She is aware to call [place] if she has questions about her eligibility and to bring her application with all  needed documents to the office once she has completed it so I can take care of the provider part and fax in.

## 2018-11-18 ENCOUNTER — Other Ambulatory Visit: Payer: Self-pay | Admitting: Interventional Cardiology

## 2018-11-18 DIAGNOSIS — I480 Paroxysmal atrial fibrillation: Secondary | ICD-10-CM

## 2018-11-21 ENCOUNTER — Telehealth: Payer: Self-pay | Admitting: Interventional Cardiology

## 2018-11-21 MED ORDER — DIGOXIN 125 MCG PO TABS: 125.0000 ug | ORAL_TABLET | Freq: Every day | ORAL | 1 refills | Status: DC

## 2018-11-21 NOTE — Telephone Encounter (Signed)
Pt's medication was sent to pt's pharmacy as requested. Confirmation received.  °

## 2018-11-21 NOTE — Telephone Encounter (Signed)
°*  STAT* If patient is at the pharmacy, call can be transferred to refill team.   1. Which medications need to be refilled? (please list name of each medication and dose if known) *new prescriptions for Digoxin and Cartia XT  2. Which pharmacy/location (including street and city if local pharmacy) is medication to be sent to? [phone]  3. Do they need a 30 day or 90 day supply? 90 days and refiills

## 2019-01-15 ENCOUNTER — Other Ambulatory Visit: Payer: Self-pay | Admitting: Pharmacist

## 2019-01-15 MED ORDER — APIXABAN 2.5 MG PO TABS: 2.5000 mg | ORAL_TABLET | Freq: Two times a day (BID) | ORAL | 5 refills | Status: DC

## 2019-01-15 NOTE — Progress Notes (Signed)
Age 84, weight 54kg, SCr 0.96 on 07/13/18 at [facility]. Last OV March 2020, afib indication

## 2019-04-27 ENCOUNTER — Other Ambulatory Visit: Payer: Self-pay

## 2019-04-27 ENCOUNTER — Encounter: Payer: Self-pay | Admitting: Interventional Cardiology

## 2019-04-27 ENCOUNTER — Ambulatory Visit: Payer: Medicare Other | Admitting: Interventional Cardiology

## 2019-04-27 VITALS — BP 132/62 | HR 67 | Ht 64.5 in | Wt 120.4 lb

## 2019-04-27 DIAGNOSIS — I480 Paroxysmal atrial fibrillation: Secondary | ICD-10-CM | POA: Diagnosis not present

## 2019-04-27 DIAGNOSIS — Z5181 Encounter for therapeutic drug level monitoring: Secondary | ICD-10-CM | POA: Diagnosis not present

## 2019-04-27 DIAGNOSIS — Z79899 Other long term (current) drug therapy: Secondary | ICD-10-CM

## 2019-04-27 DIAGNOSIS — R030 Elevated blood-pressure reading, without diagnosis of hypertension: Secondary | ICD-10-CM

## 2019-04-27 DIAGNOSIS — E782 Mixed hyperlipidemia: Secondary | ICD-10-CM | POA: Diagnosis not present

## 2019-04-27 MED ORDER — ROSUVASTATIN CALCIUM 10 MG PO TABS: ORAL_TABLET | ORAL | 3 refills | Status: DC

## 2019-04-27 MED ORDER — DILTIAZEM HCL 60 MG PO TABS: 60.0000 mg | ORAL_TABLET | Freq: Every day | ORAL | 3 refills | Status: DC | PRN

## 2019-04-27 NOTE — Patient Instructions (Signed)
Medication Instructions:  Your physician recommends that you continue on your current medications as directed. Please refer to the Current Medication list given to you today.  *If you need a refill on your cardiac medications before your next appointment, please call your pharmacy*   Lab Work: Please have digoxin level checked with your Primary Care Doctor  If you have labs (blood work) drawn today and your tests are completely normal, you will receive your results only by: Marland Kitchen MyChart Message (if you have MyChart) OR . A paper copy in the mail If you have any lab test that is abnormal or we need to change your treatment, we will call you to review the results.   Testing/Procedures: None ordered   Follow-Up: At [facility], you and your health needs are our priority.  As part of our continuing mission to provide you with exceptional heart care, we have created designated Provider Care Teams.  These Care Teams include your primary Cardiologist (physician) and Advanced Practice Providers (APPs -  Physician Assistants and Nurse Practitioners) who all work together to provide you with the care you need, when you need it.  We recommend signing up for the patient portal called "MyChart".  Sign up information is provided on this After Visit Summary.  MyChart is used to connect with patients for Virtual Visits (Telemedicine).  Patients are able to view lab/test results, encounter notes, upcoming appointments, etc.  Non-urgent messages can be sent to your provider as well.   To learn more about what you can do with MyChart, go to [email or web address].    Your next appointment:   12 month(s)  The format for your next appointment:   In Person  Provider:   You may see Larae Grooms, MD or one of the following Advanced Practice Providers on your designated Care Team:    Melina Copa, PA-C  Ermalinda Barrios, PA-C    Other Instructions

## 2019-04-27 NOTE — Progress Notes (Signed)
Cardiology Office Note   Date:  04/27/2019   ID:  Melissa Randolph, DOB May 26, 1934, MRN [id number]  PCP:  Antony Contras, MD    No chief complaint on file.  AFib  Wt Readings from Last 3 Encounters:  04/27/19 120 lb 6.4 oz (54.6 kg)  04/26/18 118 lb 9.6 oz (53.8 kg)  03/17/18 121 lb 3.2 oz (55 kg)       History of Present Illness: Melissa Randolph is a 85 y.o. female  who has had multiple atrial arrhythmias including atrial fibrillation. Sx have ben controlled on [place]. In the past, he has had some palpitations during stressful days, while wearing monitor in 2013, as she was not taking the usual extra diltiazem. She had AFib 3% of the time.   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  She has declined anticoagulation in the past.   Was found to be in AFib.  Agreed to low dose Eliquis in 10/19.   Still uses prn Diltiazem.    Since COVID, she has bnt gone to the gym.  SHe stayed active with pickle ball, walking, yard work.  SOme exercise indoors.  Denies : Chest pain. Dizziness. Leg edema. Nitroglycerin use. Orthopnea. Palpitations. Paroxysmal nocturnal dyspnea. Shortness of breath. Syncope.   No bleeding problems.  Tolerating problems very well.  She has not played piano in church since last Feb 2020.    Home BP readings are in the 123456 systolic range.  She had both COVID vaccines.   Past Medical History:  Diagnosis Date  . Allergy   . Asthma   . Atrial fibrillation ([place])   . Heart murmur   . HTN (hypertension)   . Hypercholesteremia   . Mixed hyperlipidemia   . Palpitation     Past Surgical History:  Procedure Laterality Date  . BREAST SURGERY       Current Outpatient Medications  Medication Sig Dispense Refill  . apixaban (ELIQUIS) 2.5 MG TABS tablet Take 1 tablet (2.5 mg total) by mouth 2 (two) times daily. 60 tablet 5  . Ascorbic Acid (VITAMIN  C PO) Take 1 tablet by mouth daily. 400 mg    . azelastine (ASTELIN) 0.1 % nasal spray Place 1 spray into both nostrils 2 (two) times daily. Use in each nostril as directed    . CARTIA XT 180 MG 24 hr capsule TAKE ONE CAPSULE BY MOUTH TWICE A DAY 180 capsule 1  . cholecalciferol (VITAMIN D) 400 UNITS TABS tablet Take 1,000 Units by mouth daily.     . diclofenac sodium (VOLTAREN) 1 % GEL Apply 2 g topically 4 (four) times daily as needed (pain). 100 g 0  . digoxin (LANOXIN) 0.125 MG tablet Take 1 tablet (125 mcg total) by mouth daily. 90 tablet 1  . diltiazem (CARDIZEM) 60 MG tablet Take 1 tablet (60 mg total) by mouth daily as needed (Take as directed). 90 tablet 2  . fexofenadine (ALLEGRA) 60 MG tablet Take 60 mg by mouth daily as needed for allergies or rhinitis.    . Fish Oil OIL Take 100 mg by mouth daily.     . fluticasone (FLONASE) 50 MCG/ACT nasal spray Use each nostril twice daily for 5 days, then once daily. 16 g 2  . fluticasone (FLOVENT HFA) 44 MCG/ACT inhaler Inhale 1 puff into the lungs 2 (two) times daily.    Marland Kitchen FLUZONE HIGH-DOSE 0.5 ML  SUSY FLU SHOT    . Potassium Gluconate 550 MG TABS Take 1 tablet (550 mg total) by mouth daily. 93 tablet 3  . rosuvastatin (CRESTOR) 10 MG tablet TAKE ONE TABLET BY MOUTH THREE TIMES PER WEEK ON MONDAY, WEDNESDAY, AND FRIDAY 38 tablet 3   No current facility-administered medications for this visit.    Allergies:   Ivp dye [iodinated diagnostic agents], Polycin [bacitracin-polymyxin b], and Zithromax [azithromycin]    Social History:  The patient  reports that she has never smoked. She has never used smokeless tobacco. She reports that she does not drink alcohol or use drugs.   Family History:  The patient's family history includes Cancer in her father; Hyperlipidemia in her mother; Stroke in her mother.    ROS:  Please see the history of present illness.   Otherwise, review of systems are positive for anxiety.   All other systems are reviewed  and negative.    PHYSICAL EXAM: VS:  BP 132/62   Pulse 67   Ht 5' 4.5" (1.638 m)   Wt 120 lb 6.4 oz (54.6 kg)   SpO2 99%   BMI 20.35 kg/m  , BMI Body mass index is 20.35 kg/m. GEN: Well nourished, well developed, in no acute distress  HEENT: normal  Neck: no JVD, carotid bruits, or masses Cardiac: irregularly irregular; no murmurs, rubs, or gallops,; tr edema  Respiratory:  clear to auscultation bilaterally, normal work of breathing GI: soft, nontender, nondistended, + BS MS: no deformity or atrophy  Skin: warm and dry, no rash Neuro:  Strength and sensation are intact Psych: euthymic mood, full affect   EKG:   The ekg ordered today demonstrates AFib, RBBB, controlled   Recent Labs: No results found for requested labs within last 8760 hours.   Lipid Panel    Component Value Date/Time   CHOL 180 12/20/2016 0841   TRIG 143 12/20/2016 0841   HDL 61 12/20/2016 0841   CHOLHDL 3.0 12/20/2016 0841   CHOLHDL 3.0 12/18/2015 0739   VLDL 23 12/18/2015 0739   LDLCALC 90 12/20/2016 0841     Other studies Reviewed: Additional studies/ records that were reviewed today with results demonstrating: PMD labs reviewed from 06/2018.   ASSESSMENT AND PLAN:  1. AFib: Rate controlled.  Eliquis for stroke prevention. Labs to be checked with PMD.  Please check digoxin level.  Rx given. 2. Hyperlipidemia: The current medical regimen is effective;  continue present plan and medications.  Continue rosuvastatin.  3. Elevated BP readings in the past, without hypertension: BP controlled.   Refill Cartia, Crestor   Current medicines are reviewed at length with the patient today.  The patient concerns regarding her medicines were addressed.  The following changes have been made:  No change  Labs/ tests ordered today include: Labs to be done with PMD.  No orders of the defined types were placed in this encounter.   Recommend 150 minutes/week of aerobic exercise Low fat, low carb, high  fiber diet recommended  Disposition:   FU in 1 year   Signed, Larae Grooms, MD  04/27/2019 8:49 AM    [facility] [address], [place], [place]  [address] Phone: [phone]; Fax: [phone]

## 2019-05-16 ENCOUNTER — Ambulatory Visit: Payer: Medicare Other | Admitting: Podiatry

## 2019-05-16 ENCOUNTER — Other Ambulatory Visit: Payer: Self-pay

## 2019-05-16 ENCOUNTER — Encounter: Payer: Self-pay | Admitting: Podiatry

## 2019-05-16 VITALS — Temp 97.2°F

## 2019-05-16 DIAGNOSIS — B351 Tinea unguium: Secondary | ICD-10-CM | POA: Diagnosis not present

## 2019-05-16 DIAGNOSIS — M79674 Pain in right toe(s): Secondary | ICD-10-CM | POA: Diagnosis not present

## 2019-05-16 DIAGNOSIS — L84 Corns and callosities: Secondary | ICD-10-CM | POA: Diagnosis not present

## 2019-05-16 NOTE — Progress Notes (Signed)
This patient returns to my office for at risk foot care.  This patient requires this care by a professional since this patient will be at risk due to having coagulation defect.  Patient is taking eliquiss.  This patient is unable to cut nails herself since the patient cannot reach her nails.These nails are painful walking and wearing shoes.  This patient presents for at risk foot care today.  General Appearance  Alert, conversant and in no acute stress.  Vascular  Dorsalis pedis and posterior tibial  pulses are palpable  bilaterally.  Capillary return is within normal limits  bilaterally. Temperature is within normal limits  bilaterally.  Neurologic  Senn-Weinstein monofilament wire test within normal limits  bilaterally. Muscle power within normal limits bilaterally.  Nails Thick disfigured discolored nails with subungual debris  from hallux to fifth toes bilaterally. No evidence of bacterial infection or drainage bilaterally.  Orthopedic  No limitations of motion  feet .  No crepitus or effusions noted.  No bony pathology or digital deformities noted.  Skin  normotropic skin with no porokeratosis noted bilaterally.  No signs of infections or ulcers noted.     Onychomycosis  Pain in right toes  Pain in left toes  Consent was obtained for treatment procedures.   Mechanical debridement of nails 1-5  bilaterally performed with a nail nipper.  Filed with dremel without incident.    Return office visit     prn                Told patient to return for periodic foot care and evaluation due to potential at risk complications.   Gardiner Barefoot DPM

## 2019-05-21 ENCOUNTER — Telehealth: Payer: Self-pay | Admitting: Interventional Cardiology

## 2019-05-21 MED ORDER — DILTIAZEM HCL ER COATED BEADS 180 MG PO CP24: 180.0000 mg | ORAL_CAPSULE | Freq: Two times a day (BID) | ORAL | 3 refills | Status: DC

## 2019-05-21 NOTE — Telephone Encounter (Signed)
Called and spoke to patient. Made her aware that I received her letter. Patient previously was taking Cartia XT 180 mg BID. She has had trouble with diltiazem in the past. She recently had difficulty finding the [place] XT but [facility] had it. They recently ran out and substituted diltiazem CD 180 mg capsules BID. Patient states that she has tolerated these fine and is requesting a refill be sent to [facility]. Refill sent in.

## 2019-05-21 NOTE — Telephone Encounter (Signed)
*  STAT* If patient is at the pharmacy, call can be transferred to refill team.   1. Which medications need to be refilled? (please list name of each medication and dose if known) Diltiazem CD 180 mg  2. Which pharmacy/location (including street and city if local pharmacy) is medication to be sent to? [facility] [phone]  3. Do they need a 30 day or 90 day supply? 90 patient states that pharmacy no longer have it [place] XT

## 2019-06-16 ENCOUNTER — Other Ambulatory Visit: Payer: Self-pay | Admitting: Interventional Cardiology

## 2019-06-18 ENCOUNTER — Other Ambulatory Visit: Payer: Self-pay

## 2019-06-18 MED ORDER — DIGOXIN 125 MCG PO TABS: 125.0000 ug | ORAL_TABLET | Freq: Every day | ORAL | 3 refills | Status: DC

## 2019-06-18 NOTE — Telephone Encounter (Signed)
Pt's medication was sent to pt's pharmacy as requested. Confirmation received.  °

## 2019-06-18 NOTE — Telephone Encounter (Signed)
Outpatient Medication Detail   Disp Refills Start End   digoxin (LANOXIN) 0.125 MG tablet 90 tablet 3 06/18/2019    Sig - Route: Take 1 tablet (125 mcg total) by mouth daily. - Oral   Sent to pharmacy as: digoxin (LANOXIN) 0.125 MG tablet   E-Prescribing Status: Receipt confirmed by pharmacy (06/18/2019  9:29 AM EDT)   Pharmacy  [facility] [address] [place]

## 2019-07-25 ENCOUNTER — Other Ambulatory Visit: Payer: Self-pay | Admitting: Physician Assistant

## 2019-07-25 NOTE — Telephone Encounter (Signed)
Eliquis 2.5mg  refill request received. Patient is 85 years old, weight-54.6kg, Crea-0.82 on 07/02/2019 via [facility] at [place], [place], and last seen by Dr. Irish Lack on 04/27/2019. Dose is appropriate based on dosing criteria. Will send in refill to requested pharmacy.

## 2019-09-26 ENCOUNTER — Other Ambulatory Visit: Payer: Self-pay

## 2019-09-26 ENCOUNTER — Ambulatory Visit: Payer: Medicare Other | Admitting: Podiatry

## 2019-09-26 DIAGNOSIS — M79674 Pain in right toe(s): Secondary | ICD-10-CM | POA: Diagnosis not present

## 2019-09-26 DIAGNOSIS — M79671 Pain in right foot: Secondary | ICD-10-CM | POA: Diagnosis not present

## 2019-09-26 DIAGNOSIS — M2041 Other hammer toe(s) (acquired), right foot: Secondary | ICD-10-CM

## 2019-09-26 DIAGNOSIS — B351 Tinea unguium: Secondary | ICD-10-CM | POA: Diagnosis not present

## 2019-09-26 DIAGNOSIS — L84 Corns and callosities: Secondary | ICD-10-CM | POA: Diagnosis not present

## 2019-09-26 DIAGNOSIS — M2042 Other hammer toe(s) (acquired), left foot: Secondary | ICD-10-CM

## 2019-09-28 ENCOUNTER — Encounter: Payer: Self-pay | Admitting: Podiatry

## 2019-09-28 NOTE — Progress Notes (Signed)
Subjective:  Patient ID: Melissa Randolph, female    DOB: 10-12-1934,  MRN: [id number]  Melissa Randolph presents to clinic today for painful corn(s) right 5th digit and painful thick toenails that are difficult to trim. Painful toenails interfere with ambulation. Aggravating factors include wearing enclosed shoe gear. Pain is relieved with periodic professional debridement. Painful corns are aggravated when weightbearing when wearing enclosed shoe gear. Pain is relieved with periodic professional debridement..  She voices no new pedal concerns on today's visit.  Review of Systems: Negative except as noted in the HPI. Past Medical History:  Diagnosis Date  . Allergy   . Asthma   . Atrial fibrillation ([place])   . Heart murmur   . HTN (hypertension)   . Hypercholesteremia   . Mixed hyperlipidemia   . Palpitation    Past Surgical History:  Procedure Laterality Date  . BREAST SURGERY      Current Outpatient Medications:  .  Ascorbic Acid (VITAMIN C PO), Take 1 tablet by mouth daily. 400 mg, Disp: , Rfl:  .  azelastine (ASTELIN) 0.1 % nasal spray, Place 1 spray into both nostrils 2 (two) times daily. Use in each nostril as directed, Disp: , Rfl:  .  cholecalciferol (VITAMIN D) 400 UNITS TABS tablet, Take 1,000 Units by mouth daily. , Disp: , Rfl:  .  diclofenac sodium (VOLTAREN) 1 % GEL, Apply 2 g topically 4 (four) times daily as needed (pain)., Disp: 100 g, Rfl: 0 .  digoxin (LANOXIN) 0.125 MG tablet, Take 1 tablet (125 mcg total) by mouth daily., Disp: 90 tablet, Rfl: 3 .  diltiazem (CARDIZEM CD) 180 MG 24 hr capsule, Take 1 capsule (180 mg total) by mouth in the morning and at bedtime., Disp: 180 capsule, Rfl: 3 .  diltiazem (CARDIZEM) 60 MG tablet, Take 1 tablet (60 mg total) by mouth daily as needed (Take as directed)., Disp: 90 tablet, Rfl: 3 .  ELIQUIS 2.5 MG TABS tablet, TAKE ONE TABLET BY MOUTH TWICE A DAY, Disp: 60 tablet, Rfl: 8 .  fexofenadine (ALLEGRA) 60 MG tablet, Take  60 mg by mouth daily as needed for allergies or rhinitis., Disp: , Rfl:  .  Fish Oil OIL, Take 100 mg by mouth daily. , Disp: , Rfl:  .  fluticasone (FLONASE) 50 MCG/ACT nasal spray, Use each nostril twice daily for 5 days, then once daily., Disp: 16 g, Rfl: 2 .  fluticasone (FLOVENT HFA) 44 MCG/ACT inhaler, Inhale 1 puff into the lungs 2 (two) times daily., Disp: , Rfl:  .  FLUZONE HIGH-DOSE 0.5 ML SUSY, FLU SHOT, Disp: , Rfl:  .  Potassium Gluconate 550 MG TABS, Take 1 tablet (550 mg total) by mouth daily., Disp: 93 tablet, Rfl: 3 .  rosuvastatin (CRESTOR) 10 MG tablet, TAKE ONE TABLET BY MOUTH THREE TIMES PER WEEK ON MONDAY, WEDNESDAY, AND FRIDAY, Disp: 38 tablet, Rfl: 3 .  tretinoin (RETIN-A) 0.1 % cream, Apply 1 application topically at bedtime., Disp: , Rfl:  Allergies  Allergen Reactions  . Ivp Dye [Iodinated Diagnostic Agents] Other (See Comments)    Unknown  . Polycin [Bacitracin-Polymyxin B] Other (See Comments)    Unknown  . Zithromax [Azithromycin] Other (See Comments)    Unknown   Social History   Occupational History  . Not on file  Tobacco Use  . Smoking status: Never Smoker  . Smokeless tobacco: Never Used  Vaping Use  . Vaping Use: Never used  Substance and Sexual Activity  . Alcohol use: Never  .  Drug use: Never  . Sexual activity: Not on file    Objective:   Constitutional Melissa Randolph is a pleasant 86 y.o. Caucasian female, WD, WN in NAD.Marland Kitchen AAO x 3.   Vascular Capillary refill time to digits immediate b/l. Palpable pedal pulses b/l LE. Pedal hair present. Lower extremity skin temperature gradient within normal limits.  No cyanosis or clubbing noted.  Neurologic Normal speech. Oriented to person, place, and time. Epicritic sensation to light touch grossly present bilaterally. Protective sensation intact 5/5 intact bilaterally with 10g monofilament b/l. Vibratory sensation intact b/l. Proprioception intact bilaterally.  Dermatologic Pedal skin with normal  turgor, texture and tone bilaterally. No open wounds bilaterally. No interdigital macerations bilaterally. Toenails R hallux elongated, discolored, dystrophic, thickened, and crumbly with subungual debris and tenderness to dorsal palpation. Hyperkeratotic lesion(s) R 5th toe.  No erythema, no edema, no drainage, no flocculence.  Orthopedic: Normal muscle strength 5/5 to all lower extremity muscle groups bilaterally. No pain crepitus or joint limitation noted with ROM b/l. No gross bony deformities bilaterally. Patient ambulates independent of any assistive aids.   Radiographs: None Assessment:   1. Pain due to onychomycosis of toenail of right foot   2. Acquired hammertoes of both feet   3. Corns and callosities   4. Right foot pain    Plan:  Patient was evaluated and treated and all questions answered.  Onychomycosis with pain -Nails palliatively debridement as below -Educated on self-care  Procedure: Nail Debridement Rationale: Pain Type of Debridement: manual, sharp debridement. Instrumentation: Nail nipper, rotary burr. Number of Nails: 1 -Examined patient. -Toenails R hallux debrided in length and girth without iatrogenic bleeding with sterile nail nipper and dremel.  -Corn(s) R 5th toe pared utilizing sterile scalpel blade without complication or incident. Total number debrided=1. -Patient to report any pedal injuries to medical professional immediately. -Patient to continue soft, supportive shoe gear daily. -Patient/POA to call should there be question/concern in the interim.  Return if symptoms worsen or fail to improve, for nail and callus trim.  Marzetta Board, DPM

## 2019-10-16 ENCOUNTER — Ambulatory Visit: Payer: Medicare Other | Admitting: Podiatry

## 2019-12-28 ENCOUNTER — Other Ambulatory Visit: Payer: Self-pay

## 2019-12-28 ENCOUNTER — Encounter: Payer: Self-pay | Admitting: Podiatry

## 2019-12-28 ENCOUNTER — Ambulatory Visit: Payer: Medicare Other | Admitting: Podiatry

## 2019-12-28 DIAGNOSIS — M79671 Pain in right foot: Secondary | ICD-10-CM

## 2019-12-28 DIAGNOSIS — B351 Tinea unguium: Secondary | ICD-10-CM | POA: Diagnosis not present

## 2019-12-28 DIAGNOSIS — M79674 Pain in right toe(s): Secondary | ICD-10-CM

## 2019-12-28 NOTE — Progress Notes (Signed)
This patient returns to my office for at risk foot care.  This patient requires this care by a professional since this patient will be at risk due to having coagulation defect.  Patient is taking eliquiss.  This patient is unable to cut nails herself since the patient cannot reach her nails.These nails are painful walking and wearing shoes.  This patient presents for at risk foot care today.  General Appearance  Alert, conversant and in no acute stress.  Vascular  Dorsalis pedis and posterior tibial  pulses are palpable  bilaterally.  Capillary return is within normal limits  bilaterally. Temperature is within normal limits  bilaterally.  Neurologic  Senn-Weinstein monofilament wire test within normal limits  bilaterally. Muscle power within normal limits bilaterally.  Nails Thick disfigured discolored nails with subungual debris  from hallux to fifth toes bilaterally. No evidence of bacterial infection or drainage bilaterally.  Orthopedic  No limitations of motion  feet .  No crepitus or effusions noted.  No bony pathology or digital deformities noted.  Skin  normotropic skin with no porokeratosis noted bilaterally.  No signs of infections or ulcers noted.     Onychomycosis  Pain in right toes  Pain in left toes  Consent was obtained for treatment procedures.   Mechanical debridement of nails 1-5  bilaterally performed with a nail nipper.  Filed with dremel without incident.    Return office visit     3 months                Told patient to return for periodic foot care and evaluation due to potential at risk complications.   Oluwatomisin Deman DPM  

## 2020-02-27 DIAGNOSIS — C44319 Basal cell carcinoma of skin of other parts of face: Secondary | ICD-10-CM | POA: Diagnosis not present

## 2020-04-04 ENCOUNTER — Ambulatory Visit: Payer: Medicare Other | Admitting: Podiatry

## 2020-04-04 ENCOUNTER — Encounter: Payer: Self-pay | Admitting: Podiatry

## 2020-04-04 ENCOUNTER — Other Ambulatory Visit: Payer: Self-pay

## 2020-04-04 DIAGNOSIS — B351 Tinea unguium: Secondary | ICD-10-CM

## 2020-04-04 DIAGNOSIS — M79674 Pain in right toe(s): Secondary | ICD-10-CM | POA: Diagnosis not present

## 2020-04-04 DIAGNOSIS — M79671 Pain in right foot: Secondary | ICD-10-CM

## 2020-04-04 NOTE — Progress Notes (Signed)
This patient returns to my office for at risk foot care.  This patient requires this care by a professional since this patient will be at risk due to having coagulation defect.  Patient is taking eliquiss.  This patient is unable to cut nails herself since the patient cannot reach her nails.These nails are painful walking and wearing shoes.  This patient presents for at risk foot care today.  General Appearance  Alert, conversant and in no acute stress.  Vascular  Dorsalis pedis and posterior tibial  pulses are palpable  bilaterally.  Capillary return is within normal limits  bilaterally. Temperature is within normal limits  bilaterally.  Neurologic  Senn-Weinstein monofilament wire test within normal limits  bilaterally. Muscle power within normal limits bilaterally.  Nails Thick disfigured discolored nails with subungual debris  from hallux to fifth toes bilaterally. No evidence of bacterial infection or drainage bilaterally.  Orthopedic  No limitations of motion  feet .  No crepitus or effusions noted.  No bony pathology or digital deformities noted.  Skin  normotropic skin with no porokeratosis noted bilaterally.  No signs of infections or ulcers noted.     Onychomycosis  Pain in right toes  Pain in left toes  Consent was obtained for treatment procedures.   Mechanical debridement of nails 1-5  bilaterally performed with a nail nipper.  Filed with dremel without incident.    Return office visit     3 months                Told patient to return for periodic foot care and evaluation due to potential at risk complications.   Gardiner Barefoot DPM

## 2020-04-15 DIAGNOSIS — L820 Inflamed seborrheic keratosis: Secondary | ICD-10-CM | POA: Diagnosis not present

## 2020-04-22 NOTE — Progress Notes (Signed)
Cardiology Office Note   Date:  04/23/2020   ID:  Melissa Randolph, DOB May 18, 1934, MRN [id number]  PCP:  Antony Contras, MD    No chief complaint on file.  Atrial fibrillation  Wt Readings from Last 3 Encounters:  04/23/20 123 lb 9.6 oz (56.1 kg)  04/27/19 120 lb 6.4 oz (54.6 kg)  04/26/18 118 lb 9.6 oz (53.8 kg)       History of Present Illness: Melissa Randolph is a 86 y.o. female  who has had multiple atrial arrhythmias including atrial fibrillation. Sx have ben controlled on [place]. In the past, he has had some palpitations during stressful days, while wearing monitor in 2013, as she was not taking the usual extra diltiazem. She had AFib 3% of the time.   After being premedicated with some extra diltiazem before the stress which was related to performing music in church, she avoided the AFib sx.She has declined anticoagulation in the past.  Was found to be in AFib. Agreed to low dose Eliquis in 10/19.   Has prn Diltiazem, but has not needed to use often- very rare use.  With COVID, she has not gone to the gym.  SHe stayed active with pickle ball, walking, yard work.  SOme exercise indoors recently as the weather in early 2022 has been bad.  She walks the dog regularly, but other activities are limited.  Denies : Chest pain. Dizziness. Leg edema. Nitroglycerin use. Orthopnea. Palpitations. Paroxysmal nocturnal dyspnea. Shortness of breath. Syncope. Marland Kitchen    Recent basal cell CA removed from her face. No bleeding issues.       Past Medical History:  Diagnosis Date  . Allergy   . Asthma   . Atrial fibrillation ([place])   . Heart murmur   . HTN (hypertension)   . Hypercholesteremia   . Mixed hyperlipidemia   . Palpitation     Past Surgical History:  Procedure Laterality Date  . BREAST SURGERY       Current Outpatient Medications  Medication Sig Dispense Refill  . Ascorbic Acid (VITAMIN C PO) Take 1 tablet by mouth daily. 400 mg    . azelastine  (ASTELIN) 0.1 % nasal spray Place 1 spray into both nostrils 2 (two) times daily. Use in each nostril as directed    . cholecalciferol (VITAMIN D) 400 UNITS TABS tablet Take 1,000 Units by mouth daily.     . diclofenac sodium (VOLTAREN) 1 % GEL Apply 2 g topically 4 (four) times daily as needed (pain). 100 g 0  . digoxin (LANOXIN) 0.125 MG tablet Take 1 tablet (125 mcg total) by mouth daily. 90 tablet 3  . diltiazem (CARDIZEM CD) 180 MG 24 hr capsule Take 1 capsule (180 mg total) by mouth in the morning and at bedtime. 180 capsule 3  . diltiazem (CARDIZEM) 60 MG tablet Take 1 tablet (60 mg total) by mouth daily as needed (Take as directed). 90 tablet 3  . ELIQUIS 2.5 MG TABS tablet TAKE ONE TABLET BY MOUTH TWICE A DAY 60 tablet 8  . fexofenadine (ALLEGRA) 60 MG tablet Take 60 mg by mouth daily as needed for allergies or rhinitis.    . Fish Oil OIL Take 100 mg by mouth daily.     . fluticasone (FLONASE) 50 MCG/ACT nasal spray Use each nostril twice daily for 5 days, then once daily. 16 g 2  . fluticasone (FLOVENT HFA) 44 MCG/ACT inhaler Inhale 1 puff into the lungs 2 (two) times daily.    Marland Kitchen  FLUZONE HIGH-DOSE 0.5 ML SUSY FLU SHOT    . Potassium Gluconate 550 MG TABS Take 1 tablet (550 mg total) by mouth daily. 93 tablet 3  . rosuvastatin (CRESTOR) 10 MG tablet TAKE ONE TABLET BY MOUTH THREE TIMES PER WEEK ON MONDAY, WEDNESDAY, AND FRIDAY 38 tablet 3  . tretinoin (RETIN-A) 0.1 % cream Apply 1 application topically at bedtime.     No current facility-administered medications for this visit.    Allergies:   Ivp dye [iodinated diagnostic agents], Polycin [bacitracin-polymyxin b], and Zithromax [azithromycin]    Social History:  The patient  reports that she has never smoked. She has never used smokeless tobacco. She reports that she does not drink alcohol and does not use drugs.   Family History:  The patient's family history includes Cancer in her father; Hyperlipidemia in her mother; Stroke in  her mother.    ROS:  Please see the history of present illness.   Otherwise, review of systems are positive for recent skin procedure.   All other systems are reviewed and negative.    PHYSICAL EXAM: VS:  BP 134/78   Pulse 70   Ht 5' 4.5" (1.638 m)   Wt 123 lb 9.6 oz (56.1 kg)   SpO2 97%   BMI 20.89 kg/m  , BMI Body mass index is 20.89 kg/m. GEN: Well nourished, well developed, in no acute distress  HEENT: normal  Neck: no JVD, carotid bruits, or masses Cardiac: irregularly irregular; no murmurs, rubs, or gallops,no edema  Respiratory:  clear to auscultation bilaterally, normal work of breathing GI: soft, nontender, nondistended, + BS MS: no deformity or atrophy  Skin: warm and dry, no rash Neuro:  Strength and sensation are intact Psych: euthymic mood, full affect   EKG:   The ekg ordered today demonstrates AFib, rate controlled, RBBB   Recent Labs: No results found for requested labs within last 8760 hours.   Lipid Panel    Component Value Date/Time   CHOL 180 12/20/2016 0841   TRIG 143 12/20/2016 0841   HDL 61 12/20/2016 0841   CHOLHDL 3.0 12/20/2016 0841   CHOLHDL 3.0 12/18/2015 0739   VLDL 23 12/18/2015 0739   LDLCALC 90 12/20/2016 0841     Other studies Reviewed: Additional studies/ records that were reviewed today with results demonstrating: LDL 96 in 5/21.     ASSESSMENT AND PLAN:  1. AFib: Dig level needed at least annually- done with Dr. Moreen Fowler.  Low dose Eliquis for stroke prevention.  End of the year donut hole is an issue with Eliquis.  Will get Dig level result from Dr. Moreen Fowler.  2. Hyperlipidemia: Whole food, plant based diet.  Continue Crestor as well. Exercise target as below.   3. Elevated BP: Low salt diet.  Home readings reviewed and well controlled.  4. Palpitations: Well controlled.  Rare.     Current medicines are reviewed at length with the patient today.  The patient concerns regarding her medicines were addressed.  The following  changes have been made:  No change  Labs/ tests ordered today include:  No orders of the defined types were placed in this encounter.   Recommend 150 minutes/week of aerobic exercise Low fat, low carb, high fiber diet recommended  Disposition:   FU in 1 year   Signed, Larae Grooms, MD  04/23/2020 8:24 AM    [facility] [place], [place], [place]  [address] Phone: [phone]; Fax: [phone]

## 2020-04-23 ENCOUNTER — Encounter: Payer: Self-pay | Admitting: Interventional Cardiology

## 2020-04-23 ENCOUNTER — Ambulatory Visit: Payer: Medicare Other | Admitting: Interventional Cardiology

## 2020-04-23 ENCOUNTER — Other Ambulatory Visit: Payer: Self-pay

## 2020-04-23 VITALS — BP 134/78 | HR 70 | Ht 64.5 in | Wt 123.6 lb

## 2020-04-23 DIAGNOSIS — I480 Paroxysmal atrial fibrillation: Secondary | ICD-10-CM

## 2020-04-23 DIAGNOSIS — R030 Elevated blood-pressure reading, without diagnosis of hypertension: Secondary | ICD-10-CM | POA: Diagnosis not present

## 2020-04-23 DIAGNOSIS — R002 Palpitations: Secondary | ICD-10-CM

## 2020-04-23 DIAGNOSIS — E782 Mixed hyperlipidemia: Secondary | ICD-10-CM | POA: Diagnosis not present

## 2020-04-23 MED ORDER — DIGOXIN 125 MCG PO TABS: 125.0000 ug | ORAL_TABLET | Freq: Every day | ORAL | 3 refills | Status: DC

## 2020-04-23 MED ORDER — DILTIAZEM HCL 60 MG PO TABS: 60.0000 mg | ORAL_TABLET | Freq: Every day | ORAL | 3 refills | Status: DC | PRN

## 2020-04-23 MED ORDER — APIXABAN 2.5 MG PO TABS: 2.5000 mg | ORAL_TABLET | Freq: Two times a day (BID) | ORAL | 3 refills | Status: DC

## 2020-04-23 MED ORDER — ROSUVASTATIN CALCIUM 10 MG PO TABS: ORAL_TABLET | ORAL | 3 refills | Status: DC

## 2020-04-23 MED ORDER — DILTIAZEM HCL ER COATED BEADS 180 MG PO CP24: 180.0000 mg | ORAL_CAPSULE | Freq: Two times a day (BID) | ORAL | 3 refills | Status: DC

## 2020-04-23 NOTE — Patient Instructions (Signed)

## 2020-06-02 DIAGNOSIS — L570 Actinic keratosis: Secondary | ICD-10-CM | POA: Diagnosis not present

## 2020-06-02 DIAGNOSIS — L814 Other melanin hyperpigmentation: Secondary | ICD-10-CM | POA: Diagnosis not present

## 2020-06-02 DIAGNOSIS — Z85828 Personal history of other malignant neoplasm of skin: Secondary | ICD-10-CM | POA: Diagnosis not present

## 2020-06-02 DIAGNOSIS — L820 Inflamed seborrheic keratosis: Secondary | ICD-10-CM | POA: Diagnosis not present

## 2020-06-02 DIAGNOSIS — D0462 Carcinoma in situ of skin of left upper limb, including shoulder: Secondary | ICD-10-CM | POA: Diagnosis not present

## 2020-06-02 DIAGNOSIS — L821 Other seborrheic keratosis: Secondary | ICD-10-CM | POA: Diagnosis not present

## 2020-06-02 DIAGNOSIS — C4441 Basal cell carcinoma of skin of scalp and neck: Secondary | ICD-10-CM | POA: Diagnosis not present

## 2020-06-18 DIAGNOSIS — R922 Inconclusive mammogram: Secondary | ICD-10-CM | POA: Diagnosis not present

## 2020-06-18 DIAGNOSIS — R921 Mammographic calcification found on diagnostic imaging of breast: Secondary | ICD-10-CM | POA: Diagnosis not present

## 2020-07-04 ENCOUNTER — Ambulatory Visit: Payer: Medicare Other | Admitting: Podiatry

## 2020-07-04 DIAGNOSIS — M1990 Unspecified osteoarthritis, unspecified site: Secondary | ICD-10-CM | POA: Diagnosis not present

## 2020-07-04 DIAGNOSIS — I4891 Unspecified atrial fibrillation: Secondary | ICD-10-CM | POA: Diagnosis not present

## 2020-07-04 DIAGNOSIS — M810 Age-related osteoporosis without current pathological fracture: Secondary | ICD-10-CM | POA: Diagnosis not present

## 2020-07-04 DIAGNOSIS — R600 Localized edema: Secondary | ICD-10-CM | POA: Diagnosis not present

## 2020-07-04 DIAGNOSIS — E782 Mixed hyperlipidemia: Secondary | ICD-10-CM | POA: Diagnosis not present

## 2020-07-04 DIAGNOSIS — D6869 Other thrombophilia: Secondary | ICD-10-CM | POA: Diagnosis not present

## 2020-07-04 DIAGNOSIS — J309 Allergic rhinitis, unspecified: Secondary | ICD-10-CM | POA: Diagnosis not present

## 2020-07-04 DIAGNOSIS — Z1389 Encounter for screening for other disorder: Secondary | ICD-10-CM | POA: Diagnosis not present

## 2020-07-04 DIAGNOSIS — Z0000 Encounter for general adult medical examination without abnormal findings: Secondary | ICD-10-CM | POA: Diagnosis not present

## 2020-07-04 DIAGNOSIS — J4520 Mild intermittent asthma, uncomplicated: Secondary | ICD-10-CM | POA: Diagnosis not present

## 2020-07-16 DIAGNOSIS — I4891 Unspecified atrial fibrillation: Secondary | ICD-10-CM | POA: Diagnosis not present

## 2020-07-16 DIAGNOSIS — E782 Mixed hyperlipidemia: Secondary | ICD-10-CM | POA: Diagnosis not present

## 2020-07-16 DIAGNOSIS — M810 Age-related osteoporosis without current pathological fracture: Secondary | ICD-10-CM | POA: Diagnosis not present

## 2020-08-01 ENCOUNTER — Other Ambulatory Visit: Payer: Self-pay

## 2020-08-01 ENCOUNTER — Ambulatory Visit: Payer: Medicare Other | Admitting: Podiatry

## 2020-08-01 DIAGNOSIS — B351 Tinea unguium: Secondary | ICD-10-CM | POA: Diagnosis not present

## 2020-08-01 DIAGNOSIS — M79674 Pain in right toe(s): Secondary | ICD-10-CM

## 2020-08-06 ENCOUNTER — Encounter: Payer: Self-pay | Admitting: Podiatry

## 2020-08-06 NOTE — Progress Notes (Signed)
This patient returns to my office for at risk foot care.  This patient requires this care by a professional since this patient will be at risk due to having coagulation defect.  Patient is taking eliquiss.  This patient is unable to cut nails herself since the patient cannot reach her nails.These nails are painful walking and wearing shoes.  This patient presents for at risk foot care today.  General Appearance  Alert, conversant and in no acute stress.  Vascular  Dorsalis pedis and posterior tibial  pulses are palpable  bilaterally.  Capillary return is within normal limits  bilaterally. Temperature is within normal limits  bilaterally.  Neurologic  Senn-Weinstein monofilament wire test within normal limits  bilaterally. Muscle power within normal limits bilaterally.  Nails Thick disfigured discolored nails with subungual debris  from hallux to fifth toes bilaterally. No evidence of bacterial infection or drainage bilaterally.  Orthopedic  No limitations of motion  feet .  No crepitus or effusions noted.  No bony pathology or digital deformities noted.  Skin  normotropic skin with no porokeratosis noted bilaterally.  No signs of infections or ulcers noted.     Onychomycosis  Pain in right toes  Pain in left toes  Consent was obtained for treatment procedures.   Mechanical debridement of nails 1-5  bilaterally performed with a nail nipper.  Filed with dremel without incident.    Return office visit     3 months                Told patient to return for periodic foot care and evaluation due to potential at risk complications.   Boneta Lucks D.P.M.

## 2020-08-11 DIAGNOSIS — M810 Age-related osteoporosis without current pathological fracture: Secondary | ICD-10-CM | POA: Diagnosis not present

## 2020-08-28 DIAGNOSIS — S9001XA Contusion of right ankle, initial encounter: Secondary | ICD-10-CM | POA: Diagnosis not present

## 2020-10-03 ENCOUNTER — Ambulatory Visit: Payer: Medicare Other | Admitting: Podiatry

## 2020-10-10 ENCOUNTER — Other Ambulatory Visit: Payer: Self-pay

## 2020-10-10 ENCOUNTER — Ambulatory Visit: Payer: Medicare Other | Admitting: Podiatry

## 2020-10-10 DIAGNOSIS — M79674 Pain in right toe(s): Secondary | ICD-10-CM | POA: Diagnosis not present

## 2020-10-10 DIAGNOSIS — B351 Tinea unguium: Secondary | ICD-10-CM | POA: Diagnosis not present

## 2020-10-15 ENCOUNTER — Encounter: Payer: Self-pay | Admitting: Podiatry

## 2020-10-15 NOTE — Progress Notes (Signed)
This patient returns to my office for at risk foot care.  This patient requires this care by a professional since this patient will be at risk due to having coagulation defect.  Patient is taking eliquiss.  This patient is unable to cut nails herself since the patient cannot reach her nails.These nails are painful walking and wearing shoes.  This patient presents for at risk foot care today.  General Appearance  Alert, conversant and in no acute stress.  Vascular  Dorsalis pedis and posterior tibial  pulses are palpable  bilaterally.  Capillary return is within normal limits  bilaterally. Temperature is within normal limits  bilaterally.  Neurologic  Senn-Weinstein monofilament wire test within normal limits  bilaterally. Muscle power within normal limits bilaterally.  Nails Thick disfigured discolored nails with subungual debris  from hallux to fifth toes bilaterally. No evidence of bacterial infection or drainage bilaterally.  Orthopedic  No limitations of motion  feet .  No crepitus or effusions noted.  No bony pathology or digital deformities noted.  Skin  normotropic skin with no porokeratosis noted bilaterally.  No signs of infections or ulcers noted.     Onychomycosis  Pain in right toes  Pain in left toes  Consent was obtained for treatment procedures.   Mechanical debridement of nails 1-5  bilaterally performed with a nail nipper.  Filed with dremel without incident.    Return office visit     3 months                Told patient to return for periodic foot care and evaluation due to potential at risk complications.   Boneta Lucks D.P.M.

## 2020-10-24 DIAGNOSIS — S61412A Laceration without foreign body of left hand, initial encounter: Secondary | ICD-10-CM | POA: Diagnosis not present

## 2020-10-24 DIAGNOSIS — W5503XA Scratched by cat, initial encounter: Secondary | ICD-10-CM | POA: Diagnosis not present

## 2020-11-12 DIAGNOSIS — W5503XA Scratched by cat, initial encounter: Secondary | ICD-10-CM | POA: Diagnosis not present

## 2020-11-12 DIAGNOSIS — S60511A Abrasion of right hand, initial encounter: Secondary | ICD-10-CM | POA: Diagnosis not present

## 2020-12-02 DIAGNOSIS — C44629 Squamous cell carcinoma of skin of left upper limb, including shoulder: Secondary | ICD-10-CM | POA: Diagnosis not present

## 2020-12-02 DIAGNOSIS — L821 Other seborrheic keratosis: Secondary | ICD-10-CM | POA: Diagnosis not present

## 2020-12-02 DIAGNOSIS — C44519 Basal cell carcinoma of skin of other part of trunk: Secondary | ICD-10-CM | POA: Diagnosis not present

## 2020-12-02 DIAGNOSIS — Z85828 Personal history of other malignant neoplasm of skin: Secondary | ICD-10-CM | POA: Diagnosis not present

## 2020-12-02 DIAGNOSIS — L570 Actinic keratosis: Secondary | ICD-10-CM | POA: Diagnosis not present

## 2020-12-02 DIAGNOSIS — D1801 Hemangioma of skin and subcutaneous tissue: Secondary | ICD-10-CM | POA: Diagnosis not present

## 2020-12-02 DIAGNOSIS — L820 Inflamed seborrheic keratosis: Secondary | ICD-10-CM | POA: Diagnosis not present

## 2020-12-16 DIAGNOSIS — C44629 Squamous cell carcinoma of skin of left upper limb, including shoulder: Secondary | ICD-10-CM | POA: Diagnosis not present

## 2020-12-16 DIAGNOSIS — C44519 Basal cell carcinoma of skin of other part of trunk: Secondary | ICD-10-CM | POA: Diagnosis not present

## 2020-12-16 DIAGNOSIS — C44622 Squamous cell carcinoma of skin of right upper limb, including shoulder: Secondary | ICD-10-CM | POA: Diagnosis not present

## 2020-12-29 DIAGNOSIS — L2489 Irritant contact dermatitis due to other agents: Secondary | ICD-10-CM | POA: Diagnosis not present

## 2020-12-29 DIAGNOSIS — C44622 Squamous cell carcinoma of skin of right upper limb, including shoulder: Secondary | ICD-10-CM | POA: Diagnosis not present

## 2021-01-06 DIAGNOSIS — C44622 Squamous cell carcinoma of skin of right upper limb, including shoulder: Secondary | ICD-10-CM | POA: Diagnosis not present

## 2021-01-21 DIAGNOSIS — H43393 Other vitreous opacities, bilateral: Secondary | ICD-10-CM | POA: Diagnosis not present

## 2021-01-21 DIAGNOSIS — H0102A Squamous blepharitis right eye, upper and lower eyelids: Secondary | ICD-10-CM | POA: Diagnosis not present

## 2021-01-21 DIAGNOSIS — H26491 Other secondary cataract, right eye: Secondary | ICD-10-CM | POA: Diagnosis not present

## 2021-01-21 DIAGNOSIS — Z961 Presence of intraocular lens: Secondary | ICD-10-CM | POA: Diagnosis not present

## 2021-01-27 DIAGNOSIS — Z20822 Contact with and (suspected) exposure to covid-19: Secondary | ICD-10-CM | POA: Diagnosis not present

## 2021-01-27 DIAGNOSIS — B349 Viral infection, unspecified: Secondary | ICD-10-CM | POA: Diagnosis not present

## 2021-02-11 DIAGNOSIS — M810 Age-related osteoporosis without current pathological fracture: Secondary | ICD-10-CM | POA: Diagnosis not present

## 2021-03-22 DIAGNOSIS — J0141 Acute recurrent pansinusitis: Secondary | ICD-10-CM | POA: Diagnosis not present

## 2021-04-21 ENCOUNTER — Other Ambulatory Visit: Payer: Self-pay | Admitting: Interventional Cardiology

## 2021-04-22 NOTE — Telephone Encounter (Signed)
Prescription refill request for Eliquis received. ? ?Indication: afib  ?Last office visit: 04/23/2020 ?Scr: 0.81, 07/16/2020 ?Age: 87 yo  ?Weight: 56.1 kg  ? ?Refill sent.  ?

## 2021-04-22 NOTE — Telephone Encounter (Signed)
Pt is scheduled to see Dr. Irish Lack on 3/6 ?

## 2021-04-25 DIAGNOSIS — J069 Acute upper respiratory infection, unspecified: Secondary | ICD-10-CM | POA: Diagnosis not present

## 2021-04-25 DIAGNOSIS — J020 Streptococcal pharyngitis: Secondary | ICD-10-CM | POA: Diagnosis not present

## 2021-04-25 DIAGNOSIS — R509 Fever, unspecified: Secondary | ICD-10-CM | POA: Diagnosis not present

## 2021-04-25 DIAGNOSIS — Z03818 Encounter for observation for suspected exposure to other biological agents ruled out: Secondary | ICD-10-CM | POA: Diagnosis not present

## 2021-04-26 NOTE — Progress Notes (Deleted)
?  ?Cardiology Office Note ? ? ?Date:  04/26/2021  ? ?ID:  Melissa Randolph, DOB Jun 26, 1934, MRN [id number] ? ?PCP:  Antony Contras, MD  ? ? ?No chief complaint on file. ? ? ? ?Wt Readings from Last 3 Encounters:  ?04/23/20 123 lb 9.6 oz (56.1 kg)  ?04/27/19 120 lb 6.4 oz (54.6 kg)  ?04/26/18 118 lb 9.6 oz (53.8 kg)  ?  ? ?  ?History of Present Illness: ?Melissa Randolph is a 87 y.o. female  who has had multiple atrial arrhythmias including atrial fibrillation. Sx have ben controlled on [place]. In the past, he has had some palpitations during stressful days, while wearing monitor in 2013, as she was not taking the usual extra diltiazem. She had AFib 3% of the time.  ?  ?After being premedicated with some extra diltiazem before the stress which was related to performing music in church, she avoided the AFib sx.  She has declined anticoagulation in the past.  ?  ?Was found to be in AFib.  Agreed to low dose Eliquis in 10/19.  ?  ?Has prn Diltiazem, but has not needed to use often- very rare use.   ?  ?With COVID, she stopped going to the gym.  SHe stayed active with pickle ball, walking, yard work.  Had basal cell CA removed in 2022.  ?  ?She walks the dog regularly, but other activities are limited. ? ? ? ?Past Medical History:  ?Diagnosis Date  ? Allergy   ? Asthma   ? Atrial fibrillation ([place])   ? Heart murmur   ? HTN (hypertension)   ? Hypercholesteremia   ? Mixed hyperlipidemia   ? Palpitation   ? ? ?Past Surgical History:  ?Procedure Laterality Date  ? BREAST SURGERY    ? ? ? ?Current Outpatient Medications  ?Medication Sig Dispense Refill  ? apixaban (ELIQUIS) 2.5 MG TABS tablet TAKE 1 TABLET(2.5 MG) BY MOUTH TWICE DAILY 180 tablet 1  ? Ascorbic Acid (VITAMIN C PO) Take 1 tablet by mouth daily. 400 mg    ? azelastine (ASTELIN) 0.1 % nasal spray Place 1 spray into both nostrils 2 (two) times daily. Use in each nostril as directed    ? cholecalciferol (VITAMIN D) 400 UNITS TABS tablet Take 1,000 Units by mouth  daily.     ? diclofenac sodium (VOLTAREN) 1 % GEL Apply 2 g topically 4 (four) times daily as needed (pain). 100 g 0  ? digoxin (LANOXIN) 0.125 MG tablet Take 1 tablet (125 mcg total) by mouth daily. 90 tablet 3  ? diltiazem (CARDIZEM CD) 180 MG 24 hr capsule Take 1 capsule (180 mg total) by mouth in the morning and at bedtime. 180 capsule 3  ? diltiazem (CARDIZEM) 60 MG tablet Take 1 tablet (60 mg total) by mouth daily as needed (Take as directed). 90 tablet 3  ? fexofenadine (ALLEGRA) 60 MG tablet Take 60 mg by mouth daily as needed for allergies or rhinitis.    ? Fish Oil OIL Take 100 mg by mouth daily.     ? fluticasone (FLONASE) 50 MCG/ACT nasal spray Use each nostril twice daily for 5 days, then once daily. 16 g 2  ? fluticasone (FLOVENT HFA) 44 MCG/ACT inhaler Inhale 1 puff into the lungs 2 (two) times daily.    ? FLUZONE HIGH-DOSE 0.5 ML SUSY FLU SHOT    ? Potassium Gluconate 550 MG TABS Take 1 tablet (550 mg total) by mouth daily. 93 tablet 3  ?  rosuvastatin (CRESTOR) 10 MG tablet TAKE ONE TABLET BY MOUTH THREE TIMES PER WEEK ON MONDAY, WEDNESDAY, AND FRIDAY 38 tablet 3  ? tretinoin (RETIN-A) 0.1 % cream Apply 1 application topically at bedtime.    ? ?No current facility-administered medications for this visit.  ? ? ?Allergies:   Ivp dye [iodinated contrast media], Polycin [bacitracin-polymyxin b], and Zithromax [azithromycin]  ? ? ?Social History:  The patient  reports that she has never smoked. She has never used smokeless tobacco. She reports that she does not drink alcohol and does not use drugs.  ? ?Family History:  The patient's ***family history includes Cancer in her father; Hyperlipidemia in her mother; Stroke in her mother.  ? ? ?ROS:  Please see the history of present illness.   Otherwise, review of systems are positive for ***.   All other systems are reviewed and negative.  ? ? ?PHYSICAL EXAM: ?VS:  There were no vitals taken for this visit. , BMI There is no height or weight on file to  calculate BMI. ?GEN: Well nourished, well developed, in no acute distress ?HEENT: normal ?Neck: no JVD, carotid bruits, or masses ?Cardiac: ***RRR; no murmurs, rubs, or gallops,no edema  ?Respiratory:  clear to auscultation bilaterally, normal work of breathing ?GI: soft, nontender, nondistended, + BS ?MS: no deformity or atrophy ?Skin: warm and dry, no rash ?Neuro:  Strength and sensation are intact ?Psych: euthymic mood, full affect ? ? ?EKG:   ?The ekg ordered today demonstrates *** ? ? ?Recent Labs: ?No results found for requested labs within last 8760 hours.  ? ?Lipid Panel ?   ?Component Value Date/Time  ? CHOL 180 12/20/2016 0841  ? TRIG 143 12/20/2016 0841  ? HDL 61 12/20/2016 0841  ? CHOLHDL 3.0 12/20/2016 0841  ? CHOLHDL 3.0 12/18/2015 0739  ? VLDL 23 12/18/2015 0739  ? [place] 90 12/20/2016 0841  ? ?  ?Other studies Reviewed: ?Additional studies/ records that were reviewed today with results demonstrating: ***. ? ? ?ASSESSMENT AND PLAN: ? ?AFib: Dig level needs to be followed.  ?Hyperlipidemia: ?Elevated BP: ?Palpitations: ? ? ?Current medicines are reviewed at length with the patient today.  The patient concerns regarding her medicines were addressed. ? ?The following changes have been made:  No change*** ? ?Labs/ tests ordered today include: *** ?No orders of the defined types were placed in this encounter. ? ? ?Recommend 150 minutes/week of aerobic exercise ?Low fat, low carb, high fiber diet recommended ? ?Disposition:   FU in *** ? ? ?Signed, ?Larae Grooms, MD  ?04/26/2021 9:45 PM    ?[facility], [place], [place]  [address] ?Phone: [phone]; Fax: [phone]  ? ?

## 2021-04-27 ENCOUNTER — Ambulatory Visit: Payer: Medicare Other | Admitting: Interventional Cardiology

## 2021-04-27 DIAGNOSIS — I480 Paroxysmal atrial fibrillation: Secondary | ICD-10-CM

## 2021-04-27 DIAGNOSIS — R030 Elevated blood-pressure reading, without diagnosis of hypertension: Secondary | ICD-10-CM

## 2021-04-27 DIAGNOSIS — R002 Palpitations: Secondary | ICD-10-CM

## 2021-04-27 DIAGNOSIS — E782 Mixed hyperlipidemia: Secondary | ICD-10-CM

## 2021-05-04 NOTE — Progress Notes (Incomplete)
Cardiology Office Note   Date:  05/04/2021   ID:  Melissa Randolph, DOB [birth year], MRN [id number]  PCP:  Antony Contras, MD    No chief complaint on file.  PAF  Wt Readings from Last 3 Encounters:  04/23/20 123 lb 9.6 oz (56.1 kg)  04/27/19 120 lb 6.4 oz (54.6 kg)  04/26/18 118 lb 9.6 oz (53.8 kg)       History of Present Illness: Melissa Randolph is a 87 y.o. female   who has had multiple atrial arrhythmias including atrial fibrillation. Sx have ben controlled on [place]. In the past, he has had some palpitations during stressful days, while wearing monitor in 2013, as she was not taking the usual extra diltiazem. She had AFib 3% of the time.    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  She has declined anticoagulation in the past.    Was found to be in AFib.  Agreed to low dose Eliquis in 10/19.    Has prn Diltiazem, but has not needed to use often- very rare use.     Since COVID, she has not gone to the gym.  SHe stayed active with pickle ball, walking, yard work.  SOme exercise indoors  basal cell CA removed from her face.  Past Medical History:  Diagnosis Date   Allergy    Asthma    Atrial fibrillation ([facility])    Heart murmur    HTN (hypertension)    Hypercholesteremia    Mixed hyperlipidemia    Palpitation     Past Surgical History:  Procedure Laterality Date   BREAST SURGERY       Current Outpatient Medications  Medication Sig Dispense Refill   apixaban (ELIQUIS) 2.5 MG TABS tablet TAKE 1 TABLET(2.5 MG) BY MOUTH TWICE DAILY 180 tablet 1   Ascorbic Acid (VITAMIN C PO) Take 1 tablet by mouth daily. 400 mg     azelastine (ASTELIN) 0.1 % nasal spray Place 1 spray into both nostrils 2 (two) times daily. Use in each nostril as directed     cholecalciferol (VITAMIN D) 400 UNITS TABS tablet Take 1,000 Units by mouth daily.      diclofenac sodium (VOLTAREN)  1 % GEL Apply 2 g topically 4 (four) times daily as needed (pain). 100 g 0   digoxin (LANOXIN) 0.125 MG tablet Take 1 tablet (125 mcg total) by mouth daily. 90 tablet 3   diltiazem (CARDIZEM CD) 180 MG 24 hr capsule Take 1 capsule (180 mg total) by mouth in the morning and at bedtime. 180 capsule 3   diltiazem (CARDIZEM) 60 MG tablet Take 1 tablet (60 mg total) by mouth daily as needed (Take as directed). 90 tablet 3   fexofenadine (ALLEGRA) 60 MG tablet Take 60 mg by mouth daily as needed for allergies or rhinitis.     Fish Oil OIL Take 100 mg by mouth daily.      fluticasone (FLONASE) 50 MCG/ACT nasal spray Use each nostril twice daily for 5 days, then once daily. 16 g 2   fluticasone (FLOVENT HFA) 44 MCG/ACT inhaler Inhale 1 puff into the lungs 2 (two) times daily.     FLUZONE HIGH-DOSE 0.5 ML SUSY FLU SHOT     Potassium Gluconate 550 MG TABS Take 1 tablet (550 mg total) by mouth daily. 93 tablet 3   rosuvastatin (CRESTOR) 10 MG tablet TAKE ONE TABLET BY  MOUTH THREE TIMES PER WEEK ON MONDAY, WEDNESDAY, AND FRIDAY 38 tablet 3   tretinoin (RETIN-A) 0.1 % cream Apply 1 application topically at bedtime.     No current facility-administered medications for this visit.    Allergies:   Ivp dye [iodinated contrast media], Polycin [bacitracin-polymyxin b], and Zithromax [azithromycin]    Social History:  The patient  reports that she has never smoked. She has never used smokeless tobacco. She reports that she does not drink alcohol and does not use drugs.   Family History:  The patient's ***family history includes Cancer in her father; Hyperlipidemia in her mother; Stroke in her mother.    ROS:  Please see the history of present illness.   Otherwise, review of systems are positive for ***.   All other systems are reviewed and negative.    PHYSICAL EXAM: VS:  There were no vitals taken for this visit. , BMI There is no height or weight on file to calculate BMI. GEN: Well nourished, well  developed, in no acute distress HEENT: normal Neck: no JVD, carotid bruits, or masses Cardiac: ***RRR; no murmurs, rubs, or gallops,no edema  Respiratory:  clear to auscultation bilaterally, normal work of breathing GI: soft, nontender, nondistended, + BS MS: no deformity or atrophy Skin: warm and dry, no rash Neuro:  Strength and sensation are intact Psych: euthymic mood, full affect   EKG:   The ekg ordered today demonstrates ***   Recent Labs: No results found for requested labs within last 8760 hours.   Lipid Panel    Component Value Date/Time   CHOL 180 12/20/2016 0841   TRIG 143 12/20/2016 0841   HDL 61 12/20/2016 0841   CHOLHDL 3.0 12/20/2016 0841   CHOLHDL 3.0 12/18/2015 0739   VLDL 23 12/18/2015 0739   LDLCALC 90 12/20/2016 0841     Other studies Reviewed: Additional studies/ records that were reviewed today with results demonstrating: ***.   ASSESSMENT AND PLAN:  AFib: Low dose eliquis for stroke prevention in the setting of acquired thrombophilia. Hyperlipidemia: Elevated BP Palpitations   Current medicines are reviewed at length with the patient today.  The patient concerns regarding her medicines were addressed.  The following changes have been made:  No change***  Labs/ tests ordered today include: *** No orders of the defined types were placed in this encounter.   Recommend 150 minutes/week of aerobic exercise Low fat, low carb, high fiber diet recommended  Disposition:   FU in ***   Signed, Larae Grooms, MD  05/04/2021 12:50 PM    [facility] [place], [place], [place]  [address] Phone: [phone]; Fax: [phone]

## 2021-05-06 ENCOUNTER — Other Ambulatory Visit: Payer: Self-pay

## 2021-05-06 ENCOUNTER — Encounter: Payer: Self-pay | Admitting: Interventional Cardiology

## 2021-05-06 ENCOUNTER — Ambulatory Visit: Payer: Medicare Other | Admitting: Interventional Cardiology

## 2021-05-06 VITALS — BP 146/80 | HR 76 | Ht 64.0 in | Wt 120.0 lb

## 2021-05-06 DIAGNOSIS — R002 Palpitations: Secondary | ICD-10-CM

## 2021-05-06 DIAGNOSIS — I480 Paroxysmal atrial fibrillation: Secondary | ICD-10-CM

## 2021-05-06 DIAGNOSIS — E782 Mixed hyperlipidemia: Secondary | ICD-10-CM

## 2021-05-06 DIAGNOSIS — R030 Elevated blood-pressure reading, without diagnosis of hypertension: Secondary | ICD-10-CM

## 2021-05-06 DIAGNOSIS — D6869 Other thrombophilia: Secondary | ICD-10-CM | POA: Diagnosis not present

## 2021-05-06 MED ORDER — ROSUVASTATIN CALCIUM 10 MG PO TABS: ORAL_TABLET | ORAL | 3 refills | Status: DC

## 2021-05-06 MED ORDER — DIGOXIN 125 MCG PO TABS: 125.0000 ug | ORAL_TABLET | Freq: Every day | ORAL | 3 refills | Status: DC

## 2021-05-06 MED ORDER — DILTIAZEM HCL ER COATED BEADS 180 MG PO CP24: 180.0000 mg | ORAL_CAPSULE | Freq: Two times a day (BID) | ORAL | 3 refills | Status: DC

## 2021-05-06 NOTE — Patient Instructions (Signed)

## 2021-06-04 ENCOUNTER — Emergency Department (HOSPITAL_COMMUNITY)
Admission: EM | Admit: 2021-06-04 | Discharge: 2021-06-04 | Disposition: A | Discharge status: Home / Self Care | Payer: Medicare Other | Attending: Student | Admitting: Student

## 2021-06-04 ENCOUNTER — Emergency Department (HOSPITAL_COMMUNITY): Payer: Medicare Other

## 2021-06-04 ENCOUNTER — Encounter (HOSPITAL_COMMUNITY): Payer: Self-pay

## 2021-06-04 DIAGNOSIS — S81801A Unspecified open wound, right lower leg, initial encounter: Secondary | ICD-10-CM | POA: Diagnosis not present

## 2021-06-04 DIAGNOSIS — M25519 Pain in unspecified shoulder: Secondary | ICD-10-CM | POA: Diagnosis not present

## 2021-06-04 DIAGNOSIS — Y92002 Bathroom of unspecified non-institutional (private) residence single-family (private) house as the place of occurrence of the external cause: Secondary | ICD-10-CM | POA: Diagnosis not present

## 2021-06-04 DIAGNOSIS — Z7901 Long term (current) use of anticoagulants: Secondary | ICD-10-CM | POA: Insufficient documentation

## 2021-06-04 DIAGNOSIS — S81811A Laceration without foreign body, right lower leg, initial encounter: Secondary | ICD-10-CM | POA: Diagnosis not present

## 2021-06-04 DIAGNOSIS — J45909 Unspecified asthma, uncomplicated: Secondary | ICD-10-CM | POA: Diagnosis not present

## 2021-06-04 DIAGNOSIS — Z7951 Long term (current) use of inhaled steroids: Secondary | ICD-10-CM | POA: Insufficient documentation

## 2021-06-04 DIAGNOSIS — W19XXXA Unspecified fall, initial encounter: Secondary | ICD-10-CM

## 2021-06-04 DIAGNOSIS — I6782 Cerebral ischemia: Secondary | ICD-10-CM | POA: Diagnosis not present

## 2021-06-04 DIAGNOSIS — S0990XA Unspecified injury of head, initial encounter: Secondary | ICD-10-CM | POA: Diagnosis not present

## 2021-06-04 DIAGNOSIS — Z743 Need for continuous supervision: Secondary | ICD-10-CM | POA: Diagnosis not present

## 2021-06-04 DIAGNOSIS — I10 Essential (primary) hypertension: Secondary | ICD-10-CM | POA: Diagnosis not present

## 2021-06-04 DIAGNOSIS — W1809XA Striking against other object with subsequent fall, initial encounter: Secondary | ICD-10-CM | POA: Diagnosis not present

## 2021-06-04 MED ORDER — LIDOCAINE HCL (PF) 1 % IJ SOLN
10.0000 mL | Freq: Once | INTRAMUSCULAR | Status: AC
Start: 2021-06-04 — End: 2021-06-04
  Administered 2021-06-04: 10 mL via INTRADERMAL
  Filled 2021-06-04: qty 30

## 2021-06-04 NOTE — ED Provider Notes (Signed)
?[facility] DEPT ?Provider Note ? ?CSN: [id number] ?Arrival date & time: 06/04/21 1324 ? ?Chief Complaint(s) ?Fall ? ?HPI ?Melissa Randolph is a 87 y.o. female with PMH A-fib on Eliquis, HTN, HLD who presents emergency department for evaluation of a fall.  Patient states that her cat was vomiting on the carpet this morning and thus she got up to chase the cat and unfortunately struck her right shin on a wooden baseboard and fell to the ground.  She states that she landed on outstretched wrists but does not have any wrist pain.  She did not strike her head, no loss of consciousness, no numbness, tingling, weakness or other systemic, traumatic or neurologic complaints.  She arrives with a large gaping wound to the right anterior shin.  Tetanus up-to-date. ? ? ?Fall ? ? ?Past Medical History ?Past Medical History:  ?Diagnosis Date  ? Allergy   ? Asthma   ? Atrial fibrillation ([place])   ? Heart murmur   ? HTN (hypertension)   ? Hypercholesteremia   ? Mixed hyperlipidemia   ? Palpitation   ? ?Patient Active Problem List  ? Diagnosis Date Noted  ? Corns and callosities 05/16/2019  ? Elevated blood pressure reading 12/20/2016  ? Atrial fibrillation ([place])   ? Mixed hyperlipidemia   ? HTN (hypertension)   ? ?Home Medication(s) ?Prior to Admission medications   ?Medication Sig Start Date End Date Taking? Authorizing Provider  ?apixaban (ELIQUIS) 2.5 MG TABS tablet TAKE 1 TABLET(2.5 MG) BY MOUTH TWICE DAILY 04/22/21   Jettie Booze, MD  ?Ascorbic Acid (VITAMIN C PO) Take 1 tablet by mouth daily. 400 mg    [provider]  ?azelastine (ASTELIN) 0.1 % nasal spray Place 1 spray into both nostrils 2 (two) times daily. Use in each nostril as directed    [provider]  ?cholecalciferol (VITAMIN D) 400 UNITS TABS tablet Take 1,000 Units by mouth daily.     [provider]  ?digoxin (LANOXIN) 0.125 MG tablet Take 1 tablet (125 mcg total) by mouth daily. 05/06/21   Jettie Booze, MD  ?diltiazem (CARDIZEM CD) 180 MG 24 hr capsule Take 1 capsule (180 mg total) by mouth in the morning and at bedtime. 05/06/21   Jettie Booze, MD  ?diltiazem (CARDIZEM) 60 MG tablet Take 1 tablet (60 mg total) by mouth daily as needed (Take as directed). 04/23/20   Jettie Booze, MD  ?fexofenadine (ALLEGRA) 60 MG tablet Take 60 mg by mouth daily as needed for allergies or rhinitis.    [provider]  ?Fish Oil OIL Take 100 mg by mouth daily.     [provider]  ?fluticasone (FLONASE) 50 MCG/ACT nasal spray Use each nostril twice daily for 5 days, then once daily. 01/16/18   Posey Boyer, MD  ?fluticasone (FLOVENT HFA) 44 MCG/ACT inhaler Inhale 1 puff into the lungs 2 (two) times daily.    [provider]  ?Potassium Gluconate 550 MG TABS Take 1 tablet (550 mg total) by mouth daily. 12/17/15   Jettie Booze, MD  ?rosuvastatin (CRESTOR) 10 MG tablet TAKE ONE TABLET BY MOUTH THREE TIMES PER WEEK ON MONDAY, [place], AND FRIDAY 05/06/21   Jettie Booze, MD  ?tretinoin (RETIN-A) 0.1 % cream Apply 1 application topically at bedtime. 09/16/19   [provider]  ?                                                                                                                                  ?  Past Surgical History ?Past Surgical History:  ?Procedure Laterality Date  ? BREAST SURGERY    ? ?Family History ?Family History  ?Problem Relation Age of Onset  ? Hyperlipidemia Mother   ? Stroke Mother   ? Cancer Father   ? Heart attack Neg Hx   ? Hypertension Neg Hx   ? ? ?Social History ?Social History  ? ?Tobacco Use  ? Smoking status: Never  ? Smokeless tobacco: Never  ?Vaping Use  ? Vaping Use: Never used  ?Substance Use Topics  ? Alcohol use: Never  ? Drug use: Never  ? ?Allergies ?Ivp dye [iodinated contrast media], Polycin [bacitracin-polymyxin b], and Zithromax [azithromycin] ? ?Review of Systems ?Review of Systems  ?Skin:  Positive for wound.   ? ?Physical Exam ?Vital Signs  ?I have reviewed the triage vital signs ?BP (!) 154/86 (BP Location: Left Arm)   Pulse 77   Temp 97.7 ?F (36.5 ?C) (Oral)   Resp 18   SpO2 99%  ? ?Physical Exam ?Vitals and nursing note reviewed.  ?Constitutional:   ?   General: She is not in acute distress. ?   Appearance: She is well-developed.  ?HENT:  ?   Head: Normocephalic and atraumatic.  ?Eyes:  ?   Conjunctiva/sclera: Conjunctivae normal.  ?Cardiovascular:  ?   Rate and Rhythm: Normal rate and regular rhythm.  ?   Heart sounds: No murmur heard. ?Pulmonary:  ?   Effort: Pulmonary effort is normal. No respiratory distress.  ?   Breath sounds: Normal breath sounds.  ?Abdominal:  ?   Palpations: Abdomen is soft.  ?   Tenderness: There is no abdominal tenderness.  ?Musculoskeletal:     ?   General: No swelling.  ?   Cervical back: Neck supple.  ?Skin: ?   General: Skin is warm and dry.  ?   Capillary Refill: Capillary refill takes less than 2 seconds.  ?   Findings: Lesion (Large V shaped laceration to the anterior shin) present.  ?Neurological:  ?   Mental Status: She is alert.  ?Psychiatric:     ?   Mood and Affect: Mood normal.  ? ? ?ED Results and Treatments ?Labs ?(all labs ordered are listed, but only abnormal results are displayed) ?Labs Reviewed - No data to display                                                                                                                       ? ?Radiology ?No results found. ? ?Pertinent labs & imaging results that were available during my care of the patient were reviewed by me and considered in my medical decision making (see MDM for details). ? ?Medications Ordered in ED ?Medications  ?lidocaine (PF) (XYLOCAINE) 1 % injection 10 mL (has no administration in time range)  ?                                                               ?                                                                    ?  Procedures ?Marland Kitchen.Laceration Repair ? ?Date/Time: 06/04/2021 10:41 AM ?Performed  by: Teressa Lower, MD ?Authorized by: Teressa Lower, MD  ? ?Anesthesia:  ?  Anesthesia method:  Local infiltration ?  Local anesthetic:  Lidocaine 1% w/o epi ?Laceration details:  ?  Location:  Leg ?  Leg location:  R lower leg ?  Length (cm):  20 ?Pre-procedure details:  ?  Preparation:  Patient was prepped and draped in usual sterile fashion ?Exploration:  ?  Imaging obtained: x-ray   ?  Imaging outcome: foreign body not noted   ?  Contaminated: no   ?Treatment:  ?  Area cleansed with:  Saline ?  Amount of cleaning:  Standard ?  Irrigation solution:  Sterile saline ?  Irrigation volume:  1018m ?  Irrigation method:  Pressure wash ?Skin repair:  ?  Repair method:  Sutures ?  Suture size:  3-0 ?  Suture material:  Prolene ?  Suture technique:  Horizontal mattress ?  Number of sutures:  15 ?Approximation:  ?  Approximation:  Close ?Repair type:  ?  Repair type:  Intermediate ?Post-procedure details:  ?  Dressing:  Adhesive bandage ?  Procedure completion:  Tolerated well, no immediate complications ? ?(including critical care time) ? ?Medical Decision Making / ED Course ? ? ?This patient presents to the ED for concern of fall, wound, this involves an extensive number of treatment options, and is a complaint that carries with it a high risk of complications and morbidity.  The differential diagnosis includes fracture, retained foreign body, subdural hematoma ? ?MDM: ?Patient seen emergency room for evaluation of a fall and a leg wound.  Physical exam with a 20 cm V-shaped laceration over the anterior shin.  Neurologic exam unremarkable with no focal motor or sensory deficits.  X-ray of the leg with no fracture.  CT head obtained as patient fell on a blood thinner which was reassuringly negative for traumatic bleed.  Patient's tetanus is up-to-date and thus was not redosed here in the emergency department.  Wound repaired with 15 horizontal mattress sutures after copious irrigation.  Dressing applied and patient  discharged with outpatient follow-up in 1 week for suture removal. ? ? ?Additional history obtained: ?-Additional history obtained from husband ?-External records from outside source obtained and reviewed incl

## 2021-06-04 NOTE — ED Triage Notes (Signed)
Pt presents with c/o fall that occurred today. EMS reports she fell in the bathroom around 6:50 this morning. Skin tear to lower right leg, wrapped prior to EMS arrival, still bleeding per EMS. Pt c/o right shoulder pain, on blood thinners, did not hit her head. Pt is alert and oriented per EMS. ?

## 2021-06-04 NOTE — ED Notes (Signed)
Pt's injuries were bandaged with non-adherent gauze, and wrapped with kerlix. ?

## 2021-06-04 NOTE — ED Notes (Signed)
I provided reinforced discharge education based off of discharge instructions. Pt acknowledged and understood my education. Pt had no further questions/concerns for provider/myself.  °

## 2021-06-04 NOTE — ED Provider Triage Note (Signed)
Emergency Medicine Provider Triage Evaluation Note ? ?Melissa Randolph , a 87 y.o. female  was evaluated in triage.  Pt complains of mechanical, nonsyncopal fall prior to arrival.  Patient reports that she was trying to stop her From vomiting on the rug and tripped over a board that she has in place to prevent her dogs from getting to the litter box.  Patient with laceration in place over the right leg.  Patient reports that she has been able to ambulate without difficulty since the accident.  She does take Eliquis secondary to A-fib, she denies any head injury, loss of conscious.  She reports she thinks her tetanus is up-to-date from a dog bite in the last 5 to 10 years.  She initially endorsed some right shoulder pain to EMS, on our evaluation she reports that it is only muscle soreness, she is not concerned of any fracture, dislocation. ? ?Review of Systems  ?Positive: Wound, fall ?Negative: Head injury, loss of consciousness ? ?Physical Exam  ?BP (!) 154/86 (BP Location: Left Arm)   Pulse 77   Temp 97.7 ?F (36.5 ?C) (Oral)   Resp 18   SpO2 99%  ?Gen:   Awake, no distress   ?Resp:  Normal effort  ?MSK:   Moves extremities without difficulty  ?Other:  Large V-shaped laceration of the right anterior lower extremity, minimal oozing at this time. ? ?Medical Decision Making  ?Medically screening exam initiated at 8:13 AM.  Appropriate orders placed.  Melissa Randolph was informed that the remainder of the evaluation will be completed by another provider, this initial triage assessment does not replace that evaluation, and the importance of remaining in the ED until their evaluation is complete. ? ?Patient will need sutures, she declines radiographic imaging at this time. ?  ?Anselmo Pickler, PA-C ?06/04/21 0815 ? ?

## 2021-06-06 DIAGNOSIS — S81811A Laceration without foreign body, right lower leg, initial encounter: Secondary | ICD-10-CM | POA: Diagnosis not present

## 2021-06-09 DIAGNOSIS — L03115 Cellulitis of right lower limb: Secondary | ICD-10-CM | POA: Diagnosis not present

## 2021-06-12 ENCOUNTER — Other Ambulatory Visit: Payer: Self-pay

## 2021-06-12 ENCOUNTER — Emergency Department (HOSPITAL_BASED_OUTPATIENT_CLINIC_OR_DEPARTMENT_OTHER)
Admission: EM | Admit: 2021-06-12 | Discharge: 2021-06-12 | Disposition: A | Discharge status: Home / Self Care | Payer: Medicare Other | Source: Home / Self Care | Attending: Emergency Medicine | Admitting: Emergency Medicine

## 2021-06-12 ENCOUNTER — Emergency Department (HOSPITAL_COMMUNITY): Payer: Medicare Other

## 2021-06-12 ENCOUNTER — Encounter (HOSPITAL_COMMUNITY): Payer: Self-pay | Admitting: *Deleted

## 2021-06-12 ENCOUNTER — Emergency Department (HOSPITAL_COMMUNITY)
Admission: EM | Admit: 2021-06-12 | Discharge: 2021-06-12 | Disposition: A | Discharge status: Home / Self Care | Payer: Medicare Other | Attending: Emergency Medicine | Admitting: Emergency Medicine

## 2021-06-12 DIAGNOSIS — L03115 Cellulitis of right lower limb: Secondary | ICD-10-CM | POA: Diagnosis not present

## 2021-06-12 DIAGNOSIS — Y9301 Activity, walking, marching and hiking: Secondary | ICD-10-CM | POA: Insufficient documentation

## 2021-06-12 DIAGNOSIS — M79671 Pain in right foot: Secondary | ICD-10-CM | POA: Diagnosis not present

## 2021-06-12 DIAGNOSIS — Y92019 Unspecified place in single-family (private) house as the place of occurrence of the external cause: Secondary | ICD-10-CM | POA: Insufficient documentation

## 2021-06-12 DIAGNOSIS — Z79899 Other long term (current) drug therapy: Secondary | ICD-10-CM | POA: Insufficient documentation

## 2021-06-12 DIAGNOSIS — I10 Essential (primary) hypertension: Secondary | ICD-10-CM | POA: Insufficient documentation

## 2021-06-12 DIAGNOSIS — M7989 Other specified soft tissue disorders: Secondary | ICD-10-CM

## 2021-06-12 DIAGNOSIS — S81811A Laceration without foreign body, right lower leg, initial encounter: Secondary | ICD-10-CM | POA: Insufficient documentation

## 2021-06-12 DIAGNOSIS — W268XXA Contact with other sharp object(s), not elsewhere classified, initial encounter: Secondary | ICD-10-CM | POA: Insufficient documentation

## 2021-06-12 DIAGNOSIS — Z7901 Long term (current) use of anticoagulants: Secondary | ICD-10-CM | POA: Insufficient documentation

## 2021-06-12 DIAGNOSIS — I4891 Unspecified atrial fibrillation: Secondary | ICD-10-CM | POA: Diagnosis not present

## 2021-06-12 DIAGNOSIS — M79604 Pain in right leg: Secondary | ICD-10-CM | POA: Diagnosis not present

## 2021-06-12 DIAGNOSIS — S8991XA Unspecified injury of right lower leg, initial encounter: Secondary | ICD-10-CM | POA: Diagnosis present

## 2021-06-12 LAB — BASIC METABOLIC PANEL
Anion gap: 8 (ref 5–15)
BUN: 12 mg/dL (ref 8–23)
CO2: 27 mmol/L (ref 22–32)
Calcium: 8.9 mg/dL (ref 8.9–10.3)
Chloride: 106 mmol/L (ref 98–111)
Creatinine, Ser: 0.77 mg/dL (ref 0.44–1.00)
GFR, Estimated: >60 mL/min (ref >60)
Glucose, Bld: 95 mg/dL (ref 70–99)
Potassium: 3 mmol/L — ABNORMAL LOW (ref 3.5–5.1)
Sodium: 141 mmol/L (ref 135–145)

## 2021-06-12 LAB — CBC WITH DIFFERENTIAL/PLATELET
Abs Immature Granulocytes: 0.02 10*3/uL (ref 0.00–0.07)
Basophils Absolute: 0.1 10*3/uL (ref 0.0–0.1)
Basophils Relative: 1 %
Eosinophils Absolute: 0.5 10*3/uL (ref 0.0–0.5)
Eosinophils Relative: 5 %
HCT: 35.2 % — ABNORMAL LOW (ref 36.0–46.0)
Hemoglobin: 12 g/dL (ref 12.0–15.0)
Immature Granulocytes: 0 %
Lymphocytes Relative: 20 %
Lymphs Abs: 2 10*3/uL (ref 0.7–4.0)
MCH: 30.2 pg (ref 26.0–34.0)
MCHC: 34.1 g/dL (ref 30.0–36.0)
MCV: 88.4 fL (ref 80.0–100.0)
Monocytes Absolute: 1.1 10*3/uL — ABNORMAL HIGH (ref 0.1–1.0)
Monocytes Relative: 11 %
Neutro Abs: 6.2 10*3/uL (ref 1.7–7.7)
Neutrophils Relative %: 63 %
Platelets: 351 10*3/uL (ref 150–400)
RBC: 3.98 MIL/uL (ref 3.87–5.11)
RDW: 14.9 % (ref 11.5–15.5)
WBC: 9.7 10*3/uL (ref 4.0–10.5)
nRBC: 0 % (ref 0.0–0.2)

## 2021-06-12 MED ORDER — CEFADROXIL 500 MG PO CAPS
1000.0000 mg | ORAL_CAPSULE | Freq: Once | ORAL | Status: AC
  Administered 2021-06-12: 1000 mg via ORAL
  Filled 2021-06-12: qty 2

## 2021-06-12 MED ORDER — DOXYCYCLINE HYCLATE 100 MG PO TABS
100.0000 mg | ORAL_TABLET | Freq: Once | ORAL | Status: AC
  Administered 2021-06-12: 100 mg via ORAL
  Filled 2021-06-12: qty 1

## 2021-06-12 MED ORDER — DOXYCYCLINE HYCLATE 100 MG PO CAPS: 100.0000 mg | ORAL_CAPSULE | Freq: Two times a day (BID) | ORAL | 0 refills | Status: AC

## 2021-06-12 MED ORDER — CEFADROXIL 500 MG PO CAPS: 1.0000 g | ORAL_CAPSULE | Freq: Two times a day (BID) | ORAL | 0 refills | Status: AC

## 2021-06-12 NOTE — ED Triage Notes (Signed)
Pt fell on 4/13 cutting rt leg, Tuesday went back and was started on antibiotics, today cellulitis in lower leg. ?

## 2021-06-12 NOTE — Discharge Instructions (Addendum)
Start wearing the compression sock to help with the swelling in your leg.  Stop taking the Keflex altogether and start on the 2 new antibiotics.  You can take your next dose this evening because you were given a dose here.  It will be important for you to follow-up with wound care as this may take a very long time to heal.  Continue to elevate your leg like you have been doing.  If you feel like the symptoms are worsening or you start developing a fever or have other concerns return to the emergency room ?

## 2021-06-12 NOTE — ED Provider Notes (Signed)
?[facility] DEPT ?Provider Note ? ? ?CSN: [id number] ?Arrival date & time: 06/12/21  7062 ? ?  ? ?History ? ?Chief Complaint  ?Patient presents with  ? Leg Injury  ? ? ?Melissa Randolph is a 87 y.o. female. ? ?Patient is an 87 year old female with a history of hypertension, atrial fibrillation on Eliquis, high cholesterol who is presenting today for complaint of worsening redness to wound.  Patient reports 9 days prior to arrival she was walking in her house and she hit her leg on a board causing her to fall to the ground.  She was seen in the emergency room and had repair of a large laceration.  That was on Thursday of last week.  She reported on Saturday it started looking red and she went to her doctors.  They remove the bandages placed a Vaseline bandages and rewrapped her leg.  She reported however by Tuesday the area was looking more red and inflamed as well as more swollen despite her elevating the leg and it was more painful to walk on.  She went to urgent care and at that time they rewrapped her leg and started her on Keflex.  She now has been on Keflex for the last 3 days and reports the proximal redness is a little better but the redness below her wound is getting worse and the swelling may be a little bit worse as well.  She does not systemically feel ill such as fever, malaise or myalgias.  She has not had any nausea or vomiting.  Her tib-fib was imaged when she was in the department and it was negative the day this occurred but she did not have any imaging of her foot and she does complain of some pain in her foot with walking as well. ? ?The history is provided by the patient and medical records.  ? ?  ? ?Home Medications ?Prior to Admission medications   ?Medication Sig Start Date End Date Taking? Authorizing Provider  ?cefadroxil (DURICEF) 500 MG capsule Take 2 capsules (1,000 mg total) by mouth 2 (two) times daily. 06/12/21  Yes Blanchie Dessert, MD  ?doxycycline  (VIBRAMYCIN) 100 MG capsule Take 1 capsule (100 mg total) by mouth 2 (two) times daily. 06/12/21  Yes Maxie Slovacek, Loree Fee, MD  ?apixaban (ELIQUIS) 2.5 MG TABS tablet TAKE 1 TABLET(2.5 MG) BY MOUTH TWICE DAILY 04/22/21   Jettie Booze, MD  ?Ascorbic Acid (VITAMIN C PO) Take 1 tablet by mouth daily. 400 mg    [provider]  ?azelastine (ASTELIN) 0.1 % nasal spray Place 1 spray into both nostrils 2 (two) times daily. Use in each nostril as directed    [provider]  ?cholecalciferol (VITAMIN D) 400 UNITS TABS tablet Take 1,000 Units by mouth daily.     [provider]  ?digoxin (LANOXIN) 0.125 MG tablet Take 1 tablet (125 mcg total) by mouth daily. 05/06/21   Jettie Booze, MD  ?diltiazem (CARDIZEM CD) 180 MG 24 hr capsule Take 1 capsule (180 mg total) by mouth in the morning and at bedtime. 05/06/21   Jettie Booze, MD  ?diltiazem (CARDIZEM) 60 MG tablet Take 1 tablet (60 mg total) by mouth daily as needed (Take as directed). 04/23/20   Jettie Booze, MD  ?fexofenadine (ALLEGRA) 60 MG tablet Take 60 mg by mouth daily as needed for allergies or rhinitis.    [provider]  ?Fish Oil OIL Take 100 mg by mouth daily.  [provider]  ?fluticasone (FLONASE) 50 MCG/ACT nasal spray Use each nostril twice daily for 5 days, then once daily. 01/16/18   Posey Boyer, MD  ?fluticasone (FLOVENT HFA) 44 MCG/ACT inhaler Inhale 1 puff into the lungs 2 (two) times daily.    [provider]  ?Potassium Gluconate 550 MG TABS Take 1 tablet (550 mg total) by mouth daily. 12/17/15   Jettie Booze, MD  ?rosuvastatin (CRESTOR) 10 MG tablet TAKE ONE TABLET BY MOUTH THREE TIMES PER WEEK ON MONDAY, [place], AND FRIDAY 05/06/21   Jettie Booze, MD  ?tretinoin (RETIN-A) 0.1 % cream Apply 1 application topically at bedtime. 09/16/19   [provider]  ?   ? ?Allergies    ?Ivp dye [iodinated contrast media], Polycin [bacitracin-polymyxin b],  and Zithromax [azithromycin]   ? ?Review of Systems   ?Review of Systems ? ?Physical Exam ?Updated Vital Signs ?BP 108/78   Pulse 84   Temp 98.3 ?F (36.8 ?C) (Oral)   Resp 18   Ht '5\' 4"'$  (1.626 m)   Wt 53.5 kg   SpO2 97%   BMI 20.25 kg/m?  ?Physical Exam ?Vitals and nursing note reviewed.  ?Constitutional:   ?   General: She is not in acute distress. ?   Appearance: She is well-developed.  ?HENT:  ?   Head: Normocephalic and atraumatic.  ?Eyes:  ?   Pupils: Pupils are equal, round, and reactive to light.  ?Cardiovascular:  ?   Rate and Rhythm: Normal rate and regular rhythm.  ?   Heart sounds: Normal heart sounds. No murmur heard. ?  No friction rub.  ?Pulmonary:  ?   Effort: Pulmonary effort is normal.  ?   Breath sounds: Normal breath sounds. No wheezing or rales.  ?Abdominal:  ?   General: Bowel sounds are normal. There is no distension.  ?   Palpations: Abdomen is soft.  ?   Tenderness: There is no abdominal tenderness. There is no guarding or rebound.  ?Musculoskeletal:     ?   General: Tenderness and signs of injury present. Normal range of motion.  ?   Right lower leg: Edema present.  ?     Legs: ? ?   Comments: Ecchymosis and mild tenderness over the third toe and tenderness with palpation of the foot  ?Skin: ?   General: Skin is warm and dry.  ?   Findings: No rash.  ?Neurological:  ?   Mental Status: She is alert and oriented to person, place, and time. Mental status is at baseline.  ?   Cranial Nerves: No cranial nerve deficit.  ?Psychiatric:     ?   Mood and Affect: Mood normal.     ?   Behavior: Behavior normal.  ? ? ? ?ED Results / Procedures / Treatments   ?Labs ?(all labs ordered are listed, but only abnormal results are displayed) ?Labs Reviewed  ?CBC WITH DIFFERENTIAL/PLATELET - Abnormal; Notable for the following components:  ?    Result Value  ? HCT 35.2 (*)   ? Monocytes Absolute 1.1 (*)   ? All other components within normal limits  ?BASIC METABOLIC PANEL - Abnormal; Notable for the  following components:  ? Potassium 3.0 (*)   ? All other components within normal limits  ? ? ?EKG ?None ? ?Radiology ?DG Foot Complete Right ? ?Result Date: 06/12/2021 ?CLINICAL DATA:  Patient fell on 06/04/2021 with cut to the right leg. Cellulitis in the lower leg noted today.  Right foot pain and bruising in the second toe. EXAM: RIGHT FOOT COMPLETE - 3+ VIEW COMPARISON:  Right foot radiographs 11/22/2016 and 10/18/2014 FINDINGS: No fracture or dislocation. No significant arthropathy. Small posterior and plantar calcaneal enthesophytes. Os peroneum. Diffuse swelling about the forefoot. IMPRESSION: No acute osseous abnormality in the right foot. Diffuse swelling about the forefoot. Electronically Signed   By: Ileana Roup M.D.   On: 06/12/2021 11:24  ? ?VAS [place] LOWER EXTREMITY VENOUS (DVT) (7a-7p) ? ?Result Date: 06/12/2021 ? Lower Venous DVT Study Patient Name:  Melissa Randolph  Date of Exam:   06/12/2021 Medical Rec #: [id number]          Accession #:    [id number] Date of Birth: 1934-08-19         Patient Gender: F Patient Age:   67 years Exam Location:  [facility] Procedure:      VAS [place] LOWER EXTREMITY VENOUS (DVT) Referring Phys: Loree Fee Makaelyn Aponte --------------------------------------------------------------------------------  Indications: Swelling.  Risk Factors: None identified. Limitations: Poor ultrasound/tissue interface and open wound. Comparison Study: No prior studies. Performing Technologist: Oliver Hum RVT  Examination Guidelines: A complete evaluation includes B-mode imaging, spectral Doppler, color Doppler, and power Doppler as needed of all accessible portions of each vessel. Bilateral testing is considered an integral part of a complete examination. Limited examinations for reoccurring indications may be performed as noted. The reflux portion of the exam is performed with the patient in reverse Trendelenburg.   +---------+---------------+---------+-----------+----------+--------------+ RIGHT    CompressibilityPhasicitySpontaneityPropertiesThrombus Aging +---------+---------------+---------+-----------+----------+--------------+ CFV      Full           Yes      Yes                                 +

## 2021-06-12 NOTE — ED Notes (Signed)
New dressing applied to pt's right lower leg.  Dressing includes: petroleum gauze, non-adherent pads, rolled gauze, LARGE size compression stocking and non-slip sock.  Pt tolerated well.  Pt given supplies at dc for at home dressing changes.  ?

## 2021-06-12 NOTE — ED Notes (Signed)
Pt states understanding of dc instructions, importance of follow up, and prescriptions. Pt denies questions or concerns upon dc. Pt declined wheelchair assistance upon dc. Pt ambulated out of ed w/ steady gait w/ personal walker. No belongings left in room upon dc. ? ?

## 2021-06-12 NOTE — Progress Notes (Signed)
Right lower extremity venous duplex has been completed. ?Preliminary results can be found in CV Proc through chart review.  ?Results were given to Dr. Maryan Rued. ? ?06/12/21 11:26 AM ?Carlos Levering RVT   ?

## 2021-06-14 ENCOUNTER — Other Ambulatory Visit: Payer: Self-pay | Admitting: Interventional Cardiology

## 2021-06-16 DIAGNOSIS — L03115 Cellulitis of right lower limb: Secondary | ICD-10-CM | POA: Diagnosis not present

## 2021-06-19 DIAGNOSIS — L03115 Cellulitis of right lower limb: Secondary | ICD-10-CM | POA: Diagnosis not present

## 2021-06-22 ENCOUNTER — Encounter (HOSPITAL_BASED_OUTPATIENT_CLINIC_OR_DEPARTMENT_OTHER): Payer: Medicare Other | Attending: Internal Medicine | Admitting: Internal Medicine

## 2021-06-22 DIAGNOSIS — L97912 Non-pressure chronic ulcer of unspecified part of right lower leg with fat layer exposed: Secondary | ICD-10-CM | POA: Insufficient documentation

## 2021-06-22 DIAGNOSIS — L97919 Non-pressure chronic ulcer of unspecified part of right lower leg with unspecified severity: Secondary | ICD-10-CM | POA: Diagnosis not present

## 2021-06-22 DIAGNOSIS — I10 Essential (primary) hypertension: Secondary | ICD-10-CM | POA: Insufficient documentation

## 2021-06-22 DIAGNOSIS — T798XXA Other early complications of trauma, initial encounter: Secondary | ICD-10-CM | POA: Diagnosis not present

## 2021-06-22 DIAGNOSIS — I482 Chronic atrial fibrillation, unspecified: Secondary | ICD-10-CM | POA: Insufficient documentation

## 2021-06-22 DIAGNOSIS — Z7901 Long term (current) use of anticoagulants: Secondary | ICD-10-CM | POA: Insufficient documentation

## 2021-06-26 ENCOUNTER — Encounter (HOSPITAL_BASED_OUTPATIENT_CLINIC_OR_DEPARTMENT_OTHER): Payer: Medicare Other | Admitting: General Surgery

## 2021-06-29 ENCOUNTER — Encounter (HOSPITAL_BASED_OUTPATIENT_CLINIC_OR_DEPARTMENT_OTHER): Payer: Medicare Other | Admitting: Internal Medicine

## 2021-06-29 DIAGNOSIS — I482 Chronic atrial fibrillation, unspecified: Secondary | ICD-10-CM | POA: Diagnosis not present

## 2021-06-29 DIAGNOSIS — L97912 Non-pressure chronic ulcer of unspecified part of right lower leg with fat layer exposed: Secondary | ICD-10-CM | POA: Diagnosis not present

## 2021-06-29 DIAGNOSIS — I10 Essential (primary) hypertension: Secondary | ICD-10-CM | POA: Diagnosis not present

## 2021-06-29 DIAGNOSIS — Z7901 Long term (current) use of anticoagulants: Secondary | ICD-10-CM | POA: Diagnosis not present

## 2021-06-29 NOTE — Progress Notes (Signed)
Melissa Randolph, Melissa Randolph ([id number]) ?Visit Report for 06/29/2021 ?Chief Complaint Document Details ?Patient Name: Date of Service: ?Melissa Leach C. 06/29/2021 11:30 A M ?Medical Record Number: [id number] ?Patient Account Number: [id number] ?Date of Birth/Sex: Treating RN: ?12/24/1934 (87 y.o. Melissa Randolph, Melissa Randolph ?Primary Care Provider: Antony Contras Other Clinician: ?Referring Provider: ?Treating Provider/Extender: Kalman Shan ?Antony Contras ?Weeks in Treatment: 1 ?Information Obtained from: Patient ?Chief Complaint ?06/22/2021; Right lower extremity wound following trauma ?Electronic Signature(s) ?Signed: 06/29/2021 12:25:14 PM By: Kalman Shan DO ?Entered By: Kalman Shan on 06/29/2021 12:19:38 ?-------------------------------------------------------------------------------- ?Debridement Details ?Patient Name: Date of Service: ?Melissa Leach C. 06/29/2021 11:30 A M ?Medical Record Number: [id number] ?Patient Account Number: [id number] ?Date of Birth/Sex: Treating RN: ?1935/02/22 (87 y.o. Melissa Randolph, Melissa Randolph ?Primary Care Provider: Antony Contras Other Clinician: ?Referring Provider: ?Treating Provider/Extender: Kalman Shan ?Antony Contras ?Weeks in Treatment: 1 ?Debridement Performed for Assessment: Wound #1 Right,Anterior Lower Leg ?Performed By: Physician Kalman Shan, DO ?Debridement Type: Debridement ?Level of Consciousness (Pre-procedure): Awake and Alert ?Pre-procedure Verification/Time Out Yes - 11:35 ?Taken: ?Start Time: 11:35 ?Pain Control: Lidocaine ?T Area Debrided (L x W): ?otal 9 (cm) x 8.5 (cm) = 76.5 (cm?) ?Tissue and other material debrided: ?Viable, Non-Viable, Eschar, Slough, Subcutaneous, Skin: Dermis , Skin: Epidermis, Slough ?Level: Skin/Subcutaneous Tissue ?Debridement Description: Excisional ?Instrument: Curette ?Bleeding: Minimum ?Hemostasis Achieved: Pressure ?End Time: 11:35 ?Procedural Pain: 0 ?Post Procedural Pain: 0 ?Response to Treatment: Procedure was tolerated  well ?Level of Consciousness (Post- Awake and Alert ?procedure): ?Post Debridement Measurements of Total Wound ?Length: (cm) 9 ?Width: (cm) 8.5 ?Depth: (cm) 0.2 ?Volume: (cm?) 12.017 ?Character of Wound/Ulcer Post Debridement: Improved ?Post Procedure Diagnosis ?Same as Pre-procedure ?Electronic Signature(s) ?Signed: 06/29/2021 12:25:14 PM By: Kalman Shan DO ?Signed: 06/29/2021 4:01:13 PM By: Rhae Hammock RN ?Entered By: Rhae Hammock on 06/29/2021 11:45:20 ?-------------------------------------------------------------------------------- ?HPI Details ?Patient Name: Date of Service: ?Melissa Leach C. 06/29/2021 11:30 A M ?Medical Record Number: [id number] ?Patient Account Number: [id number] ?Date of Birth/Sex: Treating RN: ?Apr 09, 1934 (87 y.o. Melissa Randolph, Melissa Randolph ?Primary Care Provider: Antony Contras Other Clinician: ?Referring Provider: ?Treating Provider/Extender: Kalman Shan ?Antony Contras ?Weeks in Treatment: 1 ?History of Present Illness ?HPI Description: Admission 06/22/2021 ?[name] is an 87 year old female with a past medical history of A-fib on Eliquis that presents to the clinic for a wound to her right lower extremity. ?She fell over a wooden baseboard on 4/13. She went to the ED for evaluation and she had 15 stitches placed. She had an x-ray of her leg that showed no ?acute osseous abnormality. She has been placing Xeroform over the wound bed intermittently. She visited the ED again on 4/21 because of wound infection. ?She was started on doxycycline and cefadroxil. She states she has a couple days remaining. She currently denies systemic signs of infection. ?5/8; patient presents for follow-up. She has been using bacitracin and Xeroform to the wound bed. She reports significant improvement in her pain to the wound ?bed. She states she is using her compression stockings daily. She denies signs of infection. ?Electronic Signature(s) ?Signed: 06/29/2021 12:25:14 PM By: Kalman Shan DO ?Entered By: Kalman Shan on 06/29/2021 12:20:45 ?-------------------------------------------------------------------------------- ?Physical Exam Details ?Patient Name: Date of Service: ?Melissa Leach C. 06/29/2021 11:30 A M ?Medical Record Number: [id number] ?Patient Account Number: [id number] ?Date of Birth/Sex: Treating RN: ?May 31, 1934 (87 y.o. Melissa Randolph, Melissa Randolph ?Primary Care Provider: Antony Contras Other Clinician: ?Referring Provider: ?Treating Provider/Extender: Kalman Shan ?Antony Contras ?Weeks in Treatment: 1 ?Constitutional ?respirations regular, non-labored  and within target range for patient.Marland Kitchen ?Cardiovascular ?2+ dorsalis pedis/posterior tibialis pulses. ?Psychiatric ?pleasant and cooperative. ?Notes ?Right lower extremity: Large open wound to the anterior shin with a nonviable surface throughout. No surrounding signs of infection. ?Electronic Signature(s) ?Signed: 06/29/2021 12:25:14 PM By: Kalman Shan DO ?Entered By: Kalman Shan on 06/29/2021 12:21:54 ?-------------------------------------------------------------------------------- ?Physician Orders Details ?Patient Name: ?Date of Service: ?Melissa Bryant [name] 06/29/2021 11:30 A M ?Medical Record Number: [id number] ?Patient Account Number: [id number] ?Date of Birth/Sex: ?Treating RN: ?[birth year] (87 y.o. Melissa Randolph, Melissa Randolph ?Primary Care Provider: Antony Contras ?Other Clinician: ?Referring Provider: ?Treating Provider/Extender: Kalman Shan ?Antony Contras ?Weeks in Treatment: 1 ?Verbal / Phone Orders: No ?Diagnosis Coding ?Follow-up Appointments ?ppointment in 1 week. - Dr Heber [facility] [place] [facility] Room # 9 ?Return A ?Bathing/ Shower/ Hygiene ?May shower and wash wound with soap and water. - liquid Dial antibacterial soap, no fragrance or moisturizing soap to wound ?Edema Control - Lymphedema / SCD / Other ?Elevate legs to the level of the heart or above for 30 minutes daily and/or when sitting, a frequency of: - 2-3 times  per day ?void standing for long periods of time. - limit time standing still, sit when possible, wear compression stockings during day ?A ?Exercise regularly - light walking, restrict excessive activities for 1 week ?Moisturize legs daily. - apply to leg, do not get on open wound ?Wound Treatment ?Wound #1 - Lower Leg Wound Laterality: Right, Anterior ?Cleanser: Soap and Water Every Other Day/15 Days ?Discharge Instructions: May shower and wash wound with dial antibacterial soap and water prior to dressing change. ?Cleanser: Byram Ancillary Kit - 15 Day Supply (Generic) Every Other Day/15 Days ?Discharge Instructions: Use supplies as instructed; Kit contains: (15) Saline Bullets; (15) 3x3 Gauze; 15 pr Gloves ?Peri-Wound Care: Sween Lotion (Moisturizing lotion) Every Other Day/15 Days ?Discharge Instructions: Apply moisturizing lotion as directed ?Prim Dressing: Iodosorb Gel 10 (gm) Tube (DME) (Generic) Every Other Day/15 Days ?ary ?Discharge Instructions: Apply to wound bed as instructed ?Secondary Dressing: Woven Gauze Sponge, Non-Sterile 4x4 in (Generic) Every Other Day/15 Days ?Discharge Instructions: Apply over primary dressing as directed. ?Secured With: [employer], 4.5x3.1 (in/yd) (Generic) Every Other Day/15 Days ?Discharge Instructions: Secure with Kerlix as directed. ?Secured With: 42M Medipore H Soft Cloth Surgical T ape, 4 x 10 (in/yd) (Generic) Every Other Day/15 Days ?Discharge Instructions: Secure with tape as directed. ?Services and Therapies ?Ankle Brachial Index (ABI) ?Electronic Signature(s) ?Signed: 06/29/2021 12:25:14 PM By: Kalman Shan DO ?Entered By: Kalman Shan on 06/29/2021 12:22:09 ?Prescription 06/29/2021 ?-------------------------------------------------------------------------------- ?Covault, Melissa C. Kalman Shan DO ?Patient Name: ?Provider: ?[birth year] [id number] ?Date of Birth: ?NPI#: ?[id number] ?Sex: ?DEA #: ?[phone] [id number] ?Phone #: ?License #: ?[place] ?Patient Address: ?[address], [place] [address] [address] ?[place], [place] [address] ?[phone] ?Allergies ?Iodinated Contrast Media; Zithroma

## 2021-06-29 NOTE — Progress Notes (Signed)
BRIA, SPARR ([id number]) ?Visit Report for 06/29/2021 ?Arrival Information Details ?Patient Name: Date of Service: ?Melissa Leach C. 06/29/2021 11:30 A M ?Medical Record Number: [id number] ?Patient Account Number: [id number] ?Date of Birth/Sex: Treating RN: ?Jun 14, 1934 (87 y.o. Melissa Randolph, Melissa Randolph ?Primary Care Haeley Fordham: Antony Contras Other Clinician: ?Referring Ahren Pettinger: ?Treating Aydien Majette/Extender: Kalman Shan ?Antony Contras ?Weeks in Treatment: 1 ?Visit Information History Since Last Visit ?Added or deleted any medications: No ?Patient Arrived: Ambulatory ?Any new allergies or adverse reactions: No ?Arrival Time: 11:25 ?Had a fall or experienced change in No ?Accompanied By: self ?activities of daily living that may affect ?Transfer Assistance: None ?risk of falls: ?Patient Identification Verified: Yes ?Signs or symptoms of abuse/neglect since last visito No ?Secondary Verification Process Completed: Yes ?Hospitalized since last visit: No ?Patient Requires Transmission-Based Precautions: No ?Implantable device outside of the clinic excluding No ?Patient Has Alerts: Yes ?cellular tissue based products placed in the center ?Patient Alerts: Patient on Blood Thinner since last visit: ?Eliquis Has Dressing in Place as Prescribed: Yes ?Pain Present Now: No ?Electronic Signature(s) ?Signed: 06/29/2021 4:01:13 PM By: Rhae Hammock RN ?Entered By: Rhae Hammock on 06/29/2021 11:25:38 ?-------------------------------------------------------------------------------- ?Encounter Discharge Information Details ?Patient Name: Date of Service: ?Melissa Leach C. 06/29/2021 11:30 A M ?Medical Record Number: [id number] ?Patient Account Number: [id number] ?Date of Birth/Sex: Treating RN: ?1935-02-08 (86 y.o. Melissa Randolph, Melissa Randolph ?Primary Care Montrice Montuori: Antony Contras Other Clinician: ?Referring Sevan Mcbroom: ?Treating Keene Gilkey/Extender: Kalman Shan ?Antony Contras ?Weeks in Treatment: 1 ?Encounter Discharge  Information Items Post Procedure Vitals ?Discharge Condition: Stable ?Temperature (F): 98.7 ?Ambulatory Status: Ambulatory ?Pulse (bpm): 74 ?Discharge Destination: Home ?Respiratory Rate (breaths/min): 17 ?Transportation: Private Auto ?Blood Pressure (mmHg): 134/74 ?Accompanied By: self ?Schedule Follow-up Appointment: Yes ?Clinical Summary of Care: Patient Declined ?Electronic Signature(s) ?Signed: 06/29/2021 4:01:13 PM By: Rhae Hammock RN ?Entered By: Rhae Hammock on 06/29/2021 11:42:58 ?-------------------------------------------------------------------------------- ?Lower Extremity Assessment Details ?Patient Name: ?Date of Service: ?Melissa Bryant [name] 06/29/2021 11:30 A M ?Medical Record Number: [id number] ?Patient Account Number: [id number] ?Date of Birth/Sex: ?Treating RN: ?1934-07-11 (87 y.o. Melissa Randolph, Melissa Randolph ?Primary Care Ahlaya Ende: Antony Contras ?Other Clinician: ?Referring Elease Swarm: ?Treating Rylyn Ranganathan/Extender: Kalman Shan ?Antony Contras ?Weeks in Treatment: 1 ?Edema Assessment ?Assessed: [Left: No] [Right: Yes] ?Edema: [Left: No] [Right: Yes] ?Calf ?Left: Right: ?Point of Measurement: 28 cm From Medial Instep 35.2 cm ?Ankle ?Left: Right: ?Point of Measurement: 10 cm From Medial Instep 23 cm ?Vascular Assessment ?Pulses: ?Dorsalis Pedis ?Palpable: [Right:Yes] ?Posterior Tibial ?Palpable: [Right:Yes] ?Electronic Signature(s) ?Signed: 06/29/2021 4:01:13 PM By: Rhae Hammock RN ?Entered By: Rhae Hammock on 06/29/2021 11:27:30 ?-------------------------------------------------------------------------------- ?Multi Wound Chart Details ?Patient Name: ?Date of Service: ?Melissa Bryant [name] 06/29/2021 11:30 A M ?Medical Record Number: [id number] ?Patient Account Number: [id number] ?Date of Birth/Sex: ?Treating RN: ?10-18-1934 (87 y.o. Melissa Randolph, Melissa Randolph ?Primary Care Patience Nuzzo: Antony Contras ?Other Clinician: ?Referring Lakrista Scaduto: ?Treating Nikolaos Maddocks/Extender: Kalman Shan ?Antony Contras ?Weeks in Treatment: 1 ?Vital Signs ?Height(in): 64 ?Pulse(bpm): 73 ?Weight(lbs): 118 ?Blood Pressure(mmHg): 148/72 ?Body Mass Index(BMI): 20.3 ?Temperature(??F): 98.6 ?Respiratory Rate(breaths/min): 17 ?Photos: [1:Right, Anterior Lower Leg] [N/A:N/A N/A] ?Wound Location: [1:Trauma] [N/A:N/A] ?Wounding Event: [1:Trauma, Other] [N/A:N/A] ?Primary Etiology: [1:Asthma, Arrhythmia, Hypertension] [N/A:N/A] ?Comorbid History: [1:06/04/2021] [N/A:N/A] ?Date Acquired: [1:1] [N/A:N/A] ?Weeks of Treatment: [1:Open] [N/A:N/A] ?Wound Status: [1:No] [N/A:N/A] ?Wound Recurrence: [1:9x8.5x0.2] [N/A:N/A] ?Measurements L x W x D (cm) [1:60.083] [N/A:N/A] ?A (cm?) : ?rea [1:12.017] [N/A:N/A] ?Volume (cm?) : [1:37.80%] [N/A:N/A] ?% Reduction in A rea: [1:-24.40%] [N/A:N/A] ?% Reduction in Volume: [1:Full Thickness Without Exposed] [N/A:N/A] ?Classification: [  1:Support Structures Medium] [N/A:N/A] ?Exudate A mount: [1:Serosanguineous] [N/A:N/A] ?Exudate Type: [1:red, brown] [N/A:N/A] ?Exudate Color: [1:Flat and Intact] [N/A:N/A] ?Wound Margin: [1:None Present (0%)] [N/A:N/A] ?Granulation A mount: [1:Large (67-100%)] [N/A:N/A] ?Necrotic A mount: [1:Eschar, Adherent Slough] [N/A:N/A] ?Necrotic Tissue: ?[1:Fat Layer (Subcutaneous Tissue): Yes N/A] ?Exposed Structures: ?[1:Fascia: No Tendon: No Muscle: No Joint: No Bone: No None] [N/A:N/A] ?Epithelialization: [1:Debridement - Excisional] [N/A:N/A] ?Debridement: ?Pre-procedure Verification/Time Out 11:35 [N/A:N/A] ?Taken: [1:Lidocaine] [N/A:N/A] ?Pain Control: [1:Necrotic/Eschar, Subcutaneous,] [N/A:N/A] ?Tissue Debrided: [1:Slough Skin/Subcutaneous Tissue] [N/A:N/A] ?Level: [1:76.5] [N/A:N/A] ?Debridement A (sq cm): [1:rea Curette] [N/A:N/A] ?Instrument: [1:Minimum] [N/A:N/A] ?Bleeding: [1:Pressure] [N/A:N/A] ?Hemostasis Achieved: [1:0] [N/A:N/A] ?Procedural Pain: [1:0] [N/A:N/A] ?Post Procedural Pain: ?Debridement Treatment Response: Procedure was tolerated well [N/A:N/A] ?Post  Debridement Measurements L x 9x8.5x0.2 [N/A:N/A] ?W x D (cm) [1:12.017] [N/A:N/A] ?Post Debridement Volume: (cm?) [1:Debridement] [N/A:N/A] ?Treatment Notes ?Wound #1 (Lower Leg) Wound Laterality: Right, Anterior ?Cleanser ?Soap and Water ?Discharge Instruction: May shower and wash wound with dial antibacterial soap and water prior to dressing change. ?Byram Ancillary Kit - 15 Day Supply ?Discharge Instruction: Use supplies as instructed; Kit contains: (15) Saline Bullets; (15) 3x3 Gauze; 15 pr Gloves ?Peri-Wound Care ?Sween Lotion (Moisturizing lotion) ?Discharge Instruction: Apply moisturizing lotion as directed ?Topical ?Primary Dressing ?Secondary Dressing ?Woven Gauze Sponge, Non-Sterile 4x4 in ?Discharge Instruction: Apply over primary dressing as directed. ?Secured With ?[employer] Roll Sterile, 4.5x3.1 (in/yd) ?Discharge Instruction: Secure with Kerlix as directed. ?33M Medipore H Soft Cloth Surgical T ape, 4 x 10 (in/yd) ?Discharge Instruction: Secure with tape as directed. ?Compression Wrap ?Compression Stockings ?Add-Ons ?Electronic Signature(s) ?Signed: 06/29/2021 12:25:14 PM By: Kalman Shan DO ?Signed: 06/29/2021 4:01:13 PM By: Rhae Hammock RN ?Entered By: Kalman Shan on 06/29/2021 12:19:32 ?-------------------------------------------------------------------------------- ?Multi-Disciplinary Care Plan Details ?Patient Name: ?Date of Service: ?Melissa Bryant [name] 06/29/2021 11:30 A M ?Medical Record Number: [id number] ?Patient Account Number: [id number] ?Date of Birth/Sex: ?Treating RN: ?05-15-1934 (87 y.o. Melissa Randolph, Melissa Randolph ?Primary Care Taron Conrey: Antony Contras ?Other Clinician: ?Referring Amylee Lodato: ?Treating Gennaro Lizotte/Extender: Kalman Shan ?Antony Contras ?Weeks in Treatment: 1 ?Active Inactive ?Abuse / Safety / Falls / Self Care Management ?Nursing Diagnoses: ?History of Falls ?Potential for injury related to falls ?Goals: ?Patient will not experience any injury related to falls ?Date  Initiated: 06/22/2021 ?Target Resolution Date: 07/25/2021 ?Goal Status: Active ?Patient will remain injury free related to falls ?Date Initiated: 06/22/2021 ?Target Resolution Date: 07/25/2021 ?Goal Status: Active ?Patient/caregiver wil

## 2021-06-30 ENCOUNTER — Other Ambulatory Visit (HOSPITAL_COMMUNITY): Payer: Self-pay | Admitting: Internal Medicine

## 2021-06-30 ENCOUNTER — Ambulatory Visit (HOSPITAL_COMMUNITY)
Admission: RE | Admit: 2021-06-30 | Discharge: 2021-06-30 | Disposition: A | Discharge status: Home / Self Care | Payer: Medicare Other | Source: Ambulatory Visit | Attending: Internal Medicine | Admitting: Internal Medicine

## 2021-06-30 DIAGNOSIS — L97912 Non-pressure chronic ulcer of unspecified part of right lower leg with fat layer exposed: Secondary | ICD-10-CM | POA: Insufficient documentation

## 2021-06-30 DIAGNOSIS — L97919 Non-pressure chronic ulcer of unspecified part of right lower leg with unspecified severity: Secondary | ICD-10-CM | POA: Diagnosis not present

## 2021-06-30 NOTE — Progress Notes (Signed)
GEORGI, TUEL ([id number]) ?Visit Report for 06/22/2021 ?Allergy List Details ?Patient Name: Date of Service: ?Melissa Bryant [name] 06/22/2021 8:30 A M ?Medical Record Number: [id number] ?Patient Account Number: [id number] ?Date of Birth/Sex: Treating RN: ?[birth year] (87 y.o. F) Sharyn Creamer ?Primary Care Jacqualine Weichel: Antony Contras Other Clinician: ?Referring Calan Doren: ?Treating Babatunde Seago/Extender: Melissa Randolph ?Antony Contras ?Weeks in Treatment: 0 ?Allergies ?Active Allergies ?Iodinated Contrast Media ?Zithromax ?Allergy Notes ?Electronic Signature(s) ?Signed: 06/30/2021 8:25:23 AM By: Sharyn Creamer RN, BSN ?Entered By: Sharyn Creamer on 06/22/2021 08:38:47 ?-------------------------------------------------------------------------------- ?Arrival Information Details ?Patient Name: Date of Service: ?Melissa Bryant [name] 06/22/2021 8:30 A M ?Medical Record Number: [id number] ?Patient Account Number: [id number] ?Date of Birth/Sex: Treating RN: ?14-Jul-1934 (87 y.o. F) Sharyn Creamer ?Primary Care Maricela Kawahara: Antony Contras Other Clinician: ?Referring Emireth Cockerham: ?Treating Tiffanyann Deroo/Extender: Melissa Randolph ?Antony Contras ?Weeks in Treatment: 0 ?Visit Information ?Patient Arrived: Ambulatory ?Arrival Time: 08:30 ?Accompanied By: self ?Transfer Assistance: None ?Patient Identification Verified: Yes ?Secondary Verification Process Completed: Yes ?Patient Requires Transmission-Based Precautions: No ?Patient Has Alerts: Yes ?Patient Alerts: Patient on Blood Thinner ?Eliquis ?Electronic Signature(s) ?Signed: 06/30/2021 8:25:23 AM By: Sharyn Creamer RN, BSN ?Entered By: Sharyn Creamer on 06/22/2021 08:31:17 ?-------------------------------------------------------------------------------- ?Clinic Level of Care Assessment Details ?Patient Name: Date of Service: ?Melissa Bryant [name] 06/22/2021 8:30 A M ?Medical Record Number: [id number] ?Patient Account Number: [id number] ?Date of Birth/Sex: Treating RN: ?09/02/1934 (87 y.o. F) Sharyn Creamer ?Primary Care Audryana Hockenberry: Antony Contras Other Clinician: ?Referring Taquan Bralley: ?Treating Jashawna Reever/Extender: Melissa Randolph ?Antony Contras ?Weeks in Treatment: 0 ?Clinic Level of Care Assessment Items ?TOOL 1 Quantity Score ?X- 1 0 ?Use when EandM and Procedure is performed on INITIAL visit ?ASSESSMENTS - Nursing Assessment / Reassessment ?X- 1 20 ?General Physical Exam (combine w/ comprehensive assessment (listed just below) when performed on new pt. evals) ?X- 1 25 ?Comprehensive Assessment (HX, ROS, Risk Assessments, Wounds Hx, etc.) ?ASSESSMENTS - Wound and Skin Assessment / Reassessment ?X- 1 10 ?Dermatologic / Skin Assessment (not related to wound area) ?ASSESSMENTS - Ostomy and/or Continence Assessment and Care ?'[]'$  - 0 ?Incontinence Assessment and Management ?'[]'$  - 0 ?Ostomy Care Assessment and Management (repouching, etc.) ?PROCESS - Coordination of Care ?X - Simple Patient / Family Education for ongoing care 1 15 ?'[]'$  - 0 ?Complex (extensive) Patient / Family Education for ongoing care ?X- 1 10 ?Staff obtains Consents, Records, T Results / Process Orders ?est ?'[]'$  - 0 ?Staff telephones HHA, Nursing Homes / Clarify orders / etc ?'[]'$  - 0 ?Routine Transfer to another Facility (non-emergent condition) ?'[]'$  - 0 ?Routine Hospital Admission (non-emergent condition) ?X- 1 15 ?New Admissions / Biomedical engineer / Ordering NPWT Apligraf, etc. ?, ?'[]'$  - 0 ?Emergency Hospital Admission (emergent condition) ?PROCESS - Special Needs ?'[]'$  - 0 ?Pediatric / Minor Patient Management ?'[]'$  - 0 ?Isolation Patient Management ?'[]'$  - 0 ?Hearing / Language / Visual special needs ?'[]'$  - 0 ?Assessment of Community assistance (transportation, D/C planning, etc.) ?'[]'$  - 0 ?Additional assistance / Altered mentation ?'[]'$  - 0 ?Support Surface(s) Assessment (bed, cushion, seat, etc.) ?INTERVENTIONS - Miscellaneous ?'[]'$  - 0 ?External ear exam ?'[]'$  - 0 ?Patient Transfer (multiple staff / Civil Service fast streamer / Similar devices) ?X- 1 5 ?Simple Staple /  Suture removal (25 or less) ?'[]'$  - 0 ?Complex Staple / Suture removal (26 or more) ?'[]'$  - 0 ?Hypo/Hyperglycemic Management (do not check if billed separately) ?X- 1 15 ?Ankle / Brachial Index (ABI) - do not check if billed separately ?Has the patient been seen at the hospital within  the last three years: Yes ?Total Score: 115 ?Level Of Care: New/Established - Level 3 ?Electronic Signature(s) ?Signed: 06/30/2021 8:25:23 AM By: Sharyn Creamer RN, BSN ?Signed: 06/30/2021 8:25:23 AM By: Sharyn Creamer RN, BSN ?Entered By: Sharyn Creamer on 06/22/2021 10:20:24 ?-------------------------------------------------------------------------------- ?Encounter Discharge Information Details ?Patient Name: ?Date of Service: ?Melissa Bryant [name] 06/22/2021 8:30 A M ?Medical Record Number: [id number] ?Patient Account Number: [id number] ?Date of Birth/Sex: ?Treating RN: ?1935-02-14 (87 y.o. F) Sharyn Creamer ?Primary Care Marrissa Dai: Antony Contras ?Other Clinician: ?Referring Viann Nielson: ?Treating Alyx Gee/Extender: Melissa Randolph ?Antony Contras ?Weeks in Treatment: 0 ?Encounter Discharge Information Items Post Procedure Vitals ?Discharge Condition: Stable ?Temperature (F): 97.7 ?Ambulatory Status: Ambulatory ?Pulse (bpm): 96 ?Discharge Destination: Home ?Respiratory Rate (breaths/min): 20 ?Transportation: Private Auto ?Blood Pressure (mmHg): 125/72 ?Accompanied By: self ?Schedule Follow-up Appointment: Yes ?Clinical Summary of Care: Patient Declined ?Electronic Signature(s) ?Signed: 06/30/2021 8:25:23 AM By: Sharyn Creamer RN, BSN ?Entered By: Sharyn Creamer on 06/22/2021 10:18:55 ?-------------------------------------------------------------------------------- ?Lower Extremity Assessment Details ?Patient Name: ?Date of Service: ?Melissa Bryant [name] 06/22/2021 8:30 A M ?Medical Record Number: [id number] ?Patient Account Number: [id number] ?Date of Birth/Sex: ?Treating RN: ?07-Jan-1935 (87 y.o. F) Sharyn Creamer ?Primary Care Ricke Kimoto: Antony Contras ?Other Clinician: ?Referring Sebasthian Stailey: ?Treating Doye Montilla/Extender: Melissa Randolph ?Antony Contras ?Weeks in Treatment: 0 ?Edema Assessment ?Assessed: [Left: No] [Right: No] ?Edema: [Left: No] [Right: Yes] ?Calf ?Left: Right: ?Point of Measurement: 28 cm From Medial Instep 35.2 cm ?Ankle ?Left: Right: ?Point of Measurement: 10 cm From Medial Instep 23 cm ?Knee To Floor ?Left: Right: ?From Medial Instep 38 cm ?Vascular Assessment ?Pulses: ?Dorsalis Pedis ?Palpable: [Right:Yes] ?Blood Pressure: ?Brachial: [Right:125] ?Electronic Signature(s) ?Signed: 06/30/2021 8:25:23 AM By: Sharyn Creamer RN, BSN ?Entered By: Sharyn Creamer on 06/22/2021 09:00:07 ?-------------------------------------------------------------------------------- ?Multi Wound Chart Details ?Patient Name: ?Date of Service: ?Melissa Bryant [name] 06/22/2021 8:30 A M ?Medical Record Number: [id number] ?Patient Account Number: [id number] ?Date of Birth/Sex: ?Treating RN: ?January 10, 1935 (87 y.o. [name], [name] ?Primary Care Fenix Ruppe: Antony Contras ?Other Clinician: ?Referring Ladarrian Asencio: ?Treating Marisue Canion/Extender: Melissa Randolph ?Antony Contras ?Weeks in Treatment: 0 ?Vital Signs ?Height(in): 64 ?Pulse(bpm): 96 ?Weight(lbs): 118 ?Blood Pressure(mmHg): 125/72 ?Body Mass Index(BMI): 20.3 ?Temperature(??F): 97.7 ?Respiratory Rate(breaths/min): 20 ?Photos: [N/A:N/A] ?Right, Anterior Lower Leg N/A N/A ?Wound Location: ?Trauma N/A N/A ?Wounding Event: ?Trauma, Other N/A N/A ?Primary Etiology: ?Asthma, Arrhythmia, Hypertension N/A N/A ?Comorbid History: ?06/04/2021 N/A N/A ?Date Acquired: ?0 N/A N/A ?Weeks of Treatment: ?Open N/A N/A ?Wound Status: ?No N/A N/A ?Wound Recurrence: ?10x12.3x0.1 N/A N/A ?Measurements L x W x D (cm) ?96.604 N/A N/A ?A (cm?) : ?rea ?9.66 N/A N/A ?Volume (cm?) : ?0.00% N/A N/A ?% Reduction in A rea: ?0.00% N/A N/A ?% Reduction in Volume: ?Full Thickness Without Exposed N/A N/A ?Classification: ?Support Structures ?Medium N/A  N/A ?Exudate A mount: ?Serosanguineous N/A N/A ?Exudate Type: ?red, brown N/A N/A ?Exudate Color: ?Flat and Intact N/A N/A ?Wound Margin: ?None Present (0%) N/A N/A ?Granulation A mount: ?Large (67-100%) N/A N/A ?Nec

## 2021-06-30 NOTE — Progress Notes (Signed)
Melissa Randolph, Melissa Randolph ([id number]) ?Visit Report for 06/22/2021 ?Abuse Risk Screen Details ?Patient Name: Date of Service: ?Melissa Randolph [name] 06/22/2021 8:30 A M ?Medical Record Number: [id number] ?Patient Account Number: [id number] ?Date of Birth/Sex: Treating RN: ?1934-04-04 (87 y.o. F) Sharyn Creamer ?Primary Care Kelley Knoth: Antony Contras Other Clinician: ?Referring Tanya Marvin: ?Treating Tansy Lorek/Extender: Kalman Shan ?Antony Contras ?Weeks in Treatment: 0 ?Abuse Risk Screen Items ?Answer ?ABUSE RISK SCREEN: ?Has anyone close to you tried to hurt or harm you recentlyo No ?Do you feel uncomfortable with anyone in your familyo No ?Has anyone forced you do things that you didnt want to doo No ?Electronic Signature(s) ?Signed: 06/30/2021 8:25:23 AM By: Sharyn Creamer RN, BSN ?Entered By: Sharyn Creamer on 06/22/2021 08:46:16 ?-------------------------------------------------------------------------------- ?Activities of Daily Living Details ?Patient Name: Date of Service: ?Melissa Randolph [name] 06/22/2021 8:30 A M ?Medical Record Number: [id number] ?Patient Account Number: [id number] ?Date of Birth/Sex: Treating RN: ?05/09/1934 (87 y.o. F) Sharyn Creamer ?Primary Care Alysiah Suppa: Antony Contras Other Clinician: ?Referring Roddie Riegler: ?Treating Moraima Burd/Extender: Kalman Shan ?Antony Contras ?Weeks in Treatment: 0 ?Activities of Daily Living Items ?Answer ?Activities of Daily Living (Please select one for each item) ?Ramona ?T Medications ?ake Completely Able ?Use T elephone Completely Able ?Care for Appearance Completely Able ?Use T oilet Completely Able ?Bath / Shower Completely Able ?Dress Self Completely Able ?Feed Self Completely Able ?Walk Completely Able ?Get In / Out Bed Completely Able ?Housework Completely Able ?Prepare Meals Completely Able ?Handle Money Completely Able ?Shop for Self Completely Able ?Electronic Signature(s) ?Signed: 06/30/2021 8:25:23 AM By: Sharyn Creamer RN, BSN ?Entered By:  Sharyn Creamer on 06/22/2021 08:46:48 ?-------------------------------------------------------------------------------- ?Education Screening Details ?Patient Name: ?Date of Service: ?Melissa Randolph [name] 06/22/2021 8:30 A M ?Medical Record Number: [id number] ?Patient Account Number: [id number] ?Date of Birth/Sex: ?Treating RN: ?Dec 11, 1934 (87 y.o. F) Sharyn Creamer ?Primary Care Denni France: Antony Contras ?Other Clinician: ?Referring Chalmers Iddings: ?Treating Swati Granberry/Extender: Kalman Shan ?Antony Contras ?Weeks in Treatment: 0 ?Learning Preferences/Education Level/Primary Language ?Learning Preference: Explanation, Demonstration, Printed Material ?Highest Education Level: College or Above ?Preferred Language: English ?Cognitive Barrier ?Language Barrier: No ?Translator Needed: No ?Memory Deficit: No ?Emotional Barrier: No ?Cultural/Religious Beliefs Affecting Medical Care: No ?Physical Barrier ?Impaired Vision: No ?Impaired Hearing: No ?Decreased Hand dexterity: No ?Knowledge/Comprehension ?Knowledge Level: High ?Comprehension Level: High ?Ability to understand written instructions: High ?Motivation ?Anxiety Level: Anxious ?Cooperation: Cooperative ?Education Importance: Acknowledges Need ?Interest in Health Problems: Asks Questions ?Perception: Coherent ?Willingness to Engage in Self-Management High ?Activities: ?Readiness to Engage in Self-Management High ?Activities: ?Electronic Signature(s) ?Signed: 06/30/2021 8:25:23 AM By: Sharyn Creamer RN, BSN ?Entered By: Sharyn Creamer on 06/22/2021 08:47:34 ?-------------------------------------------------------------------------------- ?Fall Risk Assessment Details ?Patient Name: ?Date of Service: ?Melissa Randolph [name] 06/22/2021 8:30 A M ?Medical Record Number: [id number] ?Patient Account Number: [id number] ?Date of Birth/Sex: ?Treating RN: ?[birth year] (87 y.o. F) Sharyn Creamer ?Primary Care Viera Okonski: Antony Contras ?Other Clinician: ?Referring Nitya Cauthon: ?Treating  Quanta Roher/Extender: Kalman Shan ?Antony Contras ?Weeks in Treatment: 0 ?Fall Risk Assessment Items ?Have you had 2 or more falls in the last 12 monthso 0 Yes ?Have you had any fall that resulted in injury in the last 12 monthso 0 Yes ?FALLS RISK SCREEN ?History of falling - immediate or within 3 months 25 Yes ?Secondary diagnosis (Do you have 2 or more medical diagnoseso) 0 No ?Ambulatory aid ?None/bed rest/wheelchair/nurse 0 Yes ?Crutches/cane/walker 0 No ?Furniture 0 No ?Intravenous therapy Access/Saline/Heparin Lock 0 No ?Gait/Transferring ?Normal/ bed rest/ wheelchair 0 Yes ?Weak (short steps with or  without shuffle, stooped but able to lift head while walking, may seek 0 No ?support from furniture) ?Impaired (short steps with shuffle, may have difficulty arising from chair, head down, impaired 0 No ?balance) ?Mental Status ?Oriented to own ability 0 Yes ?Electronic Signature(s) ?Signed: 06/30/2021 8:25:23 AM By: Sharyn Creamer RN, BSN ?Entered By: Sharyn Creamer on 06/22/2021 08:48:03 ?-------------------------------------------------------------------------------- ?Foot Assessment Details ?Patient Name: ?Date of Service: ?Melissa Randolph [name] 06/22/2021 8:30 A M ?Medical Record Number: [id number] ?Patient Account Number: [id number] ?Date of Birth/Sex: ?Treating RN: ?Nov 10, 1934 (87 y.o. F) Sharyn Creamer ?Primary Care Fama Muenchow: Antony Contras ?Other Clinician: ?Referring Kaspian Muccio: ?Treating Meliah Appleman/Extender: Kalman Shan ?Antony Contras ?Weeks in Treatment: 0 ?Foot Assessment Items ?Site Locations ?+ = Sensation present, - = Sensation absent, C = Callus, U = Ulcer ?R = Redness, W = Warmth, M = Maceration, PU = Pre-ulcerative lesion ?F = Fissure, S = Swelling, D = Dryness ?Assessment ?Right: Left: ?Other Deformity: No No ?Prior Foot Ulcer: No No ?Prior Amputation: No No ?Charcot Joint: No No ?Ambulatory Status: ?Gait: ?Electronic Signature(s) ?Signed: 06/30/2021 8:25:23 AM By: Sharyn Creamer RN, BSN ?Entered By:  Sharyn Creamer on 06/22/2021 08:49:57 ?-------------------------------------------------------------------------------- ?Nutrition Risk Screening Details ?Patient Name: ?Date of Service: ?Melissa Randolph [name] 06/22/2021 8:30 A M ?Medical Record Number: [id number] ?Patient Account Number: [id number] ?Date of Birth/Sex: ?Treating RN: ?[birth year] (87 y.o. F) Sharyn Creamer ?Primary Care Hays Dunnigan: Antony Contras ?Other Clinician: ?Referring Marvalene Barrett: ?Treating Koki Buxton/Extender: Kalman Shan ?Antony Contras ?Weeks in Treatment: 0 ?Height (in): 64 ?Weight (lbs): 118 ?Body Mass Index (BMI): 20.3 ?Nutrition Risk Screening Items ?Score Screening ?NUTRITION RISK SCREEN: ?I have an illness or condition that made me change the kind and/or amount of food I eat 0 No ?I eat fewer than two meals per day 0 No ?I eat few fruits and vegetables, or milk products 0 No ?I have three or more drinks of beer, liquor or wine almost every day 0 No ?I have tooth or mouth problems that make it hard for me to eat 0 No ?I don't always have enough money to buy the food I need 0 No ?I eat alone most of the time 0 No ?I take three or more different prescribed or over-the-counter drugs a day 0 No ?Without wanting to, I have lost or gained 10 pounds in the last six months 0 No ?I am not always physically able to shop, cook and/or feed myself 0 No ?Nutrition Protocols ?Good Risk Protocol ?Moderate Risk Protocol ?High Risk Proctocol ?Risk Level: Good Risk ?Score: 0 ?Electronic Signature(s) ?Signed: 06/30/2021 8:25:23 AM By: Sharyn Creamer RN, BSN ?Entered By: Sharyn Creamer on 06/22/2021 08:48:27 ?

## 2021-06-30 NOTE — Progress Notes (Signed)
SUBRENA, DEVEREUX ([id number]) ?Visit Report for 06/22/2021 ?Chief Complaint Document Details ?Patient Name: Date of Service: ?Melissa Bryant [name] 06/22/2021 8:30 A M ?Medical Record Number: [id number] ?Patient Account Number: [id number] ?Date of Birth/Sex: Treating RN: ?01/25/1935 (87 y.o. Melissa Randolph, [name] ?Primary Care Provider: Antony Contras Other Clinician: ?Referring Provider: ?Treating Provider/Extender: Kalman Shan ?Antony Contras ?Weeks in Treatment: 0 ?Information Obtained from: Patient ?Chief Complaint ?06/22/2021; Right lower extremity wound following trauma ?Electronic Signature(s) ?Signed: 06/22/2021 10:44:02 AM By: Kalman Shan DO ?Entered By: Kalman Shan on 06/22/2021 10:35:59 ?-------------------------------------------------------------------------------- ?Debridement Details ?Patient Name: Date of Service: ?Melissa Bryant [name] 06/22/2021 8:30 A M ?Medical Record Number: [id number] ?Patient Account Number: [id number] ?Date of Birth/Sex: Treating RN: ?05-20-1934 (87 y.o. F) Sharyn Creamer ?Primary Care Provider: Antony Contras Other Clinician: ?Referring Provider: ?Treating Provider/Extender: Kalman Shan ?Antony Contras ?Weeks in Treatment: 0 ?Debridement Performed for Assessment: Wound #1 Right,Anterior Lower Leg ?Performed By: Physician Kalman Shan, DO ?Debridement Type: Debridement ?Level of Consciousness (Pre-procedure): Awake and Alert ?Pre-procedure Verification/Time Out Yes - 09:31 ?Taken: ?Start Time: 09:31 ?Pain Control: ?Other : Benzocaine 20% spray ?T Area Debrided (L x W): ?otal 10 (cm) x 2 (cm) = 20 (cm?) ?Tissue and other material debrided: Non-Viable, [place], [place], Subcutaneous, [place] ?Level: Skin/Subcutaneous Tissue ?Debridement Description: Excisional ?Instrument: Curette ?Bleeding: Minimum ?Hemostasis Achieved: Pressure ?Procedural Pain: 3 ?Post Procedural Pain: 1 ?Response to Treatment: Procedure was tolerated well ?Level of Consciousness (Post- Awake and  Alert ?procedure): ?Post Debridement Measurements of Total Wound ?Length: (cm) 10 ?Width: (cm) 12.3 ?Depth: (cm) 0.1 ?Volume: (cm?) 9.66 ?Character of Wound/Ulcer Post Debridement: Stable ?Post Procedure Diagnosis ?Same as Pre-procedure ?Notes ?15 sutures removed using forceps and scissors ?Electronic Signature(s) ?Signed: 06/22/2021 10:44:02 AM By: Kalman Shan DO ?Signed: 06/30/2021 8:25:23 AM By: Sharyn Creamer RN, BSN ?Entered By: Sharyn Creamer on 06/22/2021 09:34:13 ?-------------------------------------------------------------------------------- ?HPI Details ?Patient Name: Date of Service: ?Melissa Bryant [name] 06/22/2021 8:30 A M ?Medical Record Number: [id number] ?Patient Account Number: [id number] ?Date of Birth/Sex: Treating RN: ?[birth year] (87 y.o. Melissa Randolph, [name] ?Primary Care Provider: Antony Contras Other Clinician: ?Referring Provider: ?Treating Provider/Extender: Kalman Shan ?Antony Contras ?Weeks in Treatment: 0 ?History of Present Illness ?HPI Description: Admission 06/22/2021 ?[name] is an 87 year old female with a past medical history of A-fib on Eliquis that presents to the clinic for a wound to her right lower extremity. ?She fell over a wooden baseboard on 4/13. She went to the ED for evaluation and she had 15 stitches placed. She had an x-ray of her leg that showed no ?acute osseous abnormality. She has been placing Xeroform over the wound bed intermittently. She visited the ED again on 4/21 because of wound infection. ?She was started on doxycycline and cefadroxil. She states she has a couple days remaining. She currently denies systemic signs of infection. ?Electronic Signature(s) ?Signed: 06/22/2021 10:44:02 AM By: Kalman Shan DO ?Entered By: Kalman Shan on 06/22/2021 10:39:17 ?-------------------------------------------------------------------------------- ?Physical Exam Details ?Patient Name: Date of Service: ?Melissa Bryant [name] 06/22/2021 8:30 A M ?Medical  Record Number: [id number] ?Patient Account Number: [id number] ?Date of Birth/Sex: Treating RN: ?1934-05-19 (87 y.o. Melissa Randolph, [name] ?Primary Care Provider: Antony Contras Other Clinician: ?Referring Provider: ?Treating Provider/Extender: Kalman Shan ?Antony Contras ?Weeks in Treatment: 0 ?Constitutional ?respirations regular, non-labored and within target range for patient.Marland Kitchen ?Cardiovascular ?2+ dorsalis pedis/posterior tibialis pulses. ?Psychiatric ?pleasant and cooperative. ?Notes ?Right lower extremity: There is a large V-shaped laceration to the anterior shin held together by 15 stitches. No signs  of surrounding infection. Post removal of ?stitches there is nonviable tissue and this was debrided. Overall most of the skin has stayed adhered. ?Electronic Signature(s) ?Signed: 06/22/2021 10:44:02 AM By: Kalman Shan DO ?Entered By: Kalman Shan on 06/22/2021 10:40:37 ?-------------------------------------------------------------------------------- ?Physician Orders Details ?Patient Name: ?Date of Service: ?Melissa Bryant [name] 06/22/2021 8:30 A M ?Medical Record Number: [id number] ?Patient Account Number: [id number] ?Date of Birth/Sex: ?Treating RN: ?[birth year] (87 y.o. F) Sharyn Creamer ?Primary Care Provider: Antony Contras ?Other Clinician: ?Referring Provider: ?Treating Provider/Extender: Kalman Shan ?Antony Contras ?Weeks in Treatment: 0 ?Verbal / Phone Orders: No ?Diagnosis Coding ?ICD-10 Coding ?Code Description ?L97.919 Non-pressure chronic ulcer of unspecified part of right lower leg with unspecified severity ?I48.20 Chronic atrial fibrillation, unspecified ?Z79.01 Long term (current) use of anticoagulants ?I10 Essential (primary) hypertension ?Follow-up Appointments ?ppointment in 1 week. - Dr Heber [place], [name] ?Return A ?Bathing/ Shower/ Hygiene ?May shower and wash wound with soap and water. - liquid Dial antibacterial soap, no fragrance or moisturizing soap to wound ?Edema Control - Lymphedema  / SCD / Other ?Elevate legs to the level of the heart or above for 30 minutes daily and/or when sitting, a frequency of: - 2-3 times per day ?void standing for long periods of time. - limit time standing still, sit when possible, wear compression stockings during day ?A ?Exercise regularly - light walking, restrict excessive activities for 1 week ?Moisturize legs daily. - apply to leg, do not get on open wound ?Wound Treatment ?Wound #1 - Lower Leg Wound Laterality: Right, Anterior ?Cleanser: Soap and Water 1 x Per Day/30 Days ?Discharge Instructions: May shower and wash wound with dial antibacterial soap and water prior to dressing change. ?Cleanser: Byram Ancillary Kit - 15 Day Supply (DME) (Generic) 1 x Per Day/30 Days ?Discharge Instructions: Use supplies as instructed; Kit contains: (15) Saline Bullets; (15) 3x3 Gauze; 15 pr Gloves ?Peri-Wound Care: Sween Lotion (Moisturizing lotion) 1 x Per Day/30 Days ?Discharge Instructions: Apply moisturizing lotion as directed ?Topical: Bacitracin Ointment, 1 (oz) tube (Generic) 1 x Per Day/30 Days ?Prim Dressing: Xeroform Occlusive Gauze Dressing, 4x4 in (DME) (Generic) 1 x Per Day/30 Days ?ary ?Discharge Instructions: Apply to wound bed as instructed ?Secondary Dressing: Woven Gauze Sponge, Non-Sterile 4x4 in (DME) (Generic) 1 x Per Day/30 Days ?Discharge Instructions: Apply over primary dressing as directed. ?Secured With: [employer], 4.5x3.1 (in/yd) (DME) (Generic) 1 x Per Day/30 Days ?Discharge Instructions: Secure with Kerlix as directed. ?Secured With: 25M Medipore H Soft Cloth Surgical T ape, 4 x 10 (in/yd) (DME) (Generic) 1 x Per Day/30 Days ?Discharge Instructions: Secure with tape as directed. ?Electronic Signature(s) ?Signed: 06/22/2021 10:44:02 AM By: Kalman Shan DO ?Entered By: Kalman Shan on 06/22/2021 10:40:48 ?-------------------------------------------------------------------------------- ?Problem List Details ?Patient Name: ?Date of  Service: ?Melissa Bryant [name] 06/22/2021 8:30 A M ?Medical Record Number: [id number] ?Patient Account Number: [id number] ?Date of Birth/Sex: ?Treating RN: ?[birth year] (87 y.o. Melissa Randolph, [name] ?Primary Care Provid

## 2021-07-06 ENCOUNTER — Encounter (HOSPITAL_BASED_OUTPATIENT_CLINIC_OR_DEPARTMENT_OTHER): Payer: Medicare Other | Admitting: Internal Medicine

## 2021-07-06 DIAGNOSIS — I10 Essential (primary) hypertension: Secondary | ICD-10-CM | POA: Diagnosis not present

## 2021-07-06 DIAGNOSIS — L97912 Non-pressure chronic ulcer of unspecified part of right lower leg with fat layer exposed: Secondary | ICD-10-CM

## 2021-07-06 DIAGNOSIS — Z7901 Long term (current) use of anticoagulants: Secondary | ICD-10-CM | POA: Diagnosis not present

## 2021-07-06 DIAGNOSIS — I482 Chronic atrial fibrillation, unspecified: Secondary | ICD-10-CM | POA: Diagnosis not present

## 2021-07-07 NOTE — Progress Notes (Signed)
Melissa Randolph, [name] ([id number]) ?Visit Report for 07/06/2021 ?Chief Complaint Document Details ?Patient Name: Date of Service: ?[name], [name] 07/06/2021 11:30 A M ?Medical Record Number: [id number] ?Patient Account Number: [id number] ?Date of Birth/Sex: Treating RN: ?08/19/1934 (87 y.o. F) Breedlove, [name] ?Primary Care Provider: Swayne, David Other Clinician: ?Referring Provider: ?Treating Provider/Extender: Hoffman, Jessica ?Swayne, David ?Weeks in Treatment: 2 ?Information Obtained from: Patient ?Chief Complaint ?06/22/2021; Right lower extremity wound following trauma ?Electronic Signature(s) ?Signed: 07/06/2021 12:33:58 PM By: Hoffman, Jessica DO ?Entered By: Hoffman, Jessica on 07/06/2021 12:19:29 ?-------------------------------------------------------------------------------- ?Debridement Details ?Patient Name: Date of Service: ?[name], [name] 07/06/2021 11:30 A M ?Medical Record Number: [id number] ?Patient Account Number: [id number] ?Date of Birth/Sex: Treating RN: ?09/08/1934 (87 y.o. F) Palmer, Carrie ?Primary Care Provider: Swayne, David Other Clinician: ?Referring Provider: ?Treating Provider/Extender: Hoffman, Jessica ?Swayne, David ?Weeks in Treatment: 2 ?Debridement Performed for Assessment: Wound #1 Right,Anterior Lower Leg ?Performed By: Physician Hoffman, Jessica, DO ?Debridement Type: Debridement ?Level of Consciousness (Pre-procedure): Awake and Alert ?Pre-procedure Verification/Time Out Yes - 11:51 ?Taken: ?Start Time: 11:51 ?Pain Control: ?Other : benzocaine 20% spray ?T Area Debrided (L x W): ?otal 8 (cm) x 3 (cm) = 24 (cm?) ?Tissue and other material debrided: Viable, Non-Viable, [place], Subcutaneous, [place] ?Level: Skin/Subcutaneous Tissue ?Debridement Description: Excisional ?Instrument: Curette ?Bleeding: Minimum ?Hemostasis Achieved: Pressure ?Procedural Pain: 3 ?Post Procedural Pain: 1 ?Response to Treatment: Procedure was tolerated well ?Level of Consciousness (Post- Awake and  Alert ?procedure): ?Post Debridement Measurements of Total Wound ?Length: (cm) 8 ?Width: (cm) 7.8 ?Depth: (cm) 0.2 ?Volume: (cm?) 9.802 ?Character of Wound/Ulcer Post Debridement: Improved ?Post Procedure Diagnosis ?Same as Pre-procedure ?Electronic Signature(s) ?Signed: 07/06/2021 12:33:58 PM By: Hoffman, Jessica DO ?Signed: 07/07/2021 4:04:33 PM By: Palmer, Carrie RN, BSN ?Entered By: Palmer, Carrie on 07/06/2021 11:53:33 ?-------------------------------------------------------------------------------- ?HPI Details ?Patient Name: Date of Service: ?[name], [name] 07/06/2021 11:30 A M ?Medical Record Number: [id number] ?Patient Account Number: [id number] ?Date of Birth/Sex: Treating RN: ?05/06/1934 (87 y.o. F) Breedlove, [name] ?Primary Care Provider: Swayne, David Other Clinician: ?Referring Provider: ?Treating Provider/Extender: Hoffman, Jessica ?Swayne, David ?Weeks in Treatment: 2 ?History of Present Illness ?HPI Description: Admission 06/22/2021 ?[name] is an 86-year-old female with a past medical history of A-fib on Eliquis that presents to the clinic for a wound to her right lower extremity. ?She fell over a wooden baseboard on 4/13. She went to the ED for evaluation and she had 15 stitches placed. She had an x-ray of her leg that showed no ?acute osseous abnormality. She has been placing Xeroform over the wound bed intermittently. She visited the ED again on 4/21 because of wound infection. ?She was started on doxycycline and cefadroxil. She states she has a couple days remaining. She currently denies systemic signs of infection. ?5/8; patient presents for follow-up. She has been using bacitracin and Xeroform to the wound bed. She reports significant improvement in her pain to the wound ?bed. She states she is using her compression stockings daily. She denies signs of infection. ?5/15; patient presents for follow-up. She has been using Iodoflex to the wound bed. She has no issues or complaints  today. She has been using her ?compression stocking daily. She went to vein and vascular to have her ABIs with TBI's completed. On the right she had an ABI of 1.17 and TBI of 0.68. ?Electronic Signature(s) ?Signed: 07/06/2021 12:33:58 PM By: Hoffman, Jessica DO ?Entered By: Hoffman, Jessica on 07/06/2021 12:20:40 ?-------------------------------------------------------------------------------- ?Physical Exam Details ?Patient Name: Date of Service: ?  [name], [name] 07/06/2021 11:30 A M ?Medical Record Number: [id number] ?Patient Account Number: [id number] ?Date of Birth/Sex: Treating RN: ?03/13/1934 (87 y.o. F) Breedlove, [name] ?Primary Care Provider: Swayne, David Other Clinician: ?Referring Provider: ?Treating Provider/Extender: Hoffman, Jessica ?Swayne, David ?Weeks in Treatment: 2 ?Constitutional ?respirations regular, non-labored and within target range for patient.. ?Cardiovascular ?2+ dorsalis pedis/posterior tibialis pulses. ?Psychiatric ?pleasant and cooperative. ?Notes ?Right lower extremity: Large open wound to the anterior aspect with nonviable tissue and granulation tissue present. No surrounding signs of infection. 2+ ?pitting edema to the knee. Venous stasis dermatitis ?Electronic Signature(s) ?Signed: 07/06/2021 12:33:58 PM By: Hoffman, Jessica DO ?Entered By: Hoffman, Jessica on 07/06/2021 12:32:17 ?-------------------------------------------------------------------------------- ?Physician Orders Details ?Patient Name: ?Date of Service: ?[name], [name] 07/06/2021 11:30 A M ?Medical Record Number: [id number] ?Patient Account Number: [id number] ?Date of Birth/Sex: ?Treating RN: ?03/04/1934 (87 y.o. F) Palmer, Carrie ?Primary Care Provider: Swayne, David ?Other Clinician: ?Referring Provider: ?Treating Provider/Extender: Hoffman, Jessica ?Swayne, David ?Weeks in Treatment: 2 ?Verbal / Phone Orders: No ?Diagnosis Coding ?Follow-up Appointments ?ppointment in 1 week. - Dr Hoffman and [facility] Room # 9  Monday 07/13/21 8:00am ?Return A ?Bathing/ Shower/ Hygiene ?May shower with protection but do not get wound dressing(s) wet. - purchase cast protector from [facility], [facility], [employer] ?Edema Control - Lymphedema / SCD / Other ?Elevate legs to the level of the heart or above for 30 minutes daily and/or when sitting, a frequency of: - 2-3 times per day ?void standing for long periods of time. - limit time standing still, sit when possible, wear compression stockings during day ?A ?Exercise regularly - light walking ?Moisturize legs daily. - apply to leg, do not get on open wound ?Wound Treatment ?Wound #1 - Lower Leg Wound Laterality: Right, Anterior ?Cleanser: Soap and Water Every Other Day/15 Days ?Discharge Instructions: May shower and wash wound with dial antibacterial soap and water prior to dressing change. ?Cleanser: Byram Ancillary Kit - 15 Day Supply (Generic) Every Other Day/15 Days ?Discharge Instructions: Use supplies as instructed; Kit contains: (15) Saline Bullets; (15) 3x3 Gauze; 15 pr Gloves ?Peri-Wound Care: Sween Lotion (Moisturizing lotion) Every Other Day/15 Days ?Discharge Instructions: Apply moisturizing lotion as directed ?Topical: Gentamicin Every Other Day/15 Days ?Discharge Instructions: As directed by physician ?Topical: Mupirocin Ointment Every Other Day/15 Days ?Discharge Instructions: Apply Mupirocin (Bactroban) as instructed ?Prim Dressing: Hydrofera Blue Ready Foam, 4x5 in Every Other Day/15 Days ?ary ?Discharge Instructions: Apply to wound bed as instructed ?Secondary Dressing: Woven Gauze Sponge, Non-Sterile 4x4 in (Generic) Every Other Day/15 Days ?Discharge Instructions: Apply over primary dressing as directed. ?Compression Wrap: ThreePress (3 layer compression wrap) Every Other Day/15 Days ?Discharge Instructions: Apply three layer compression as directed. ?Electronic Signature(s) ?Signed: 07/06/2021 12:33:58 PM By: Hoffman, Jessica DO ?Entered By: Hoffman, Jessica on 07/06/2021  12:32:25 ?-------------------------------------------------------------------------------- ?Problem List Details ?Patient Name: Date of Service: ?[name], [name] 07/06/2021 11:30 A M ?Medical Record Number:

## 2021-07-07 NOTE — Progress Notes (Signed)
SHANIEKA, BLEA ([id number]) ?Visit Report for 07/06/2021 ?Arrival Information Details ?Patient Name: Date of Service: ?Melissa Randolph C. 07/06/2021 11:30 A M ?Medical Record Number: [id number] ?Patient Account Number: [id number] ?Date of Birth/Sex: Treating RN: ?Melissa Randolph (87 y.o. F) Melissa Randolph ?Primary Care Khush Pasion: Antony Contras Other Clinician: ?Referring Shala Baumbach: ?Treating Keeshawn Fakhouri/Extender: Kalman Shan ?Antony Contras ?Weeks in Treatment: 2 ?Visit Information History Since Last Visit ?Added or deleted any medications: No ?Patient Arrived: Ambulatory ?Any new allergies or adverse reactions: No ?Arrival Time: 11:35 ?Had a fall or experienced change in No ?Accompanied By: self ?activities of daily living that may affect ?Transfer Assistance: None ?risk of falls: ?Patient Identification Verified: Yes ?Signs or symptoms of abuse/neglect since last visito No ?Secondary Verification Process Completed: Yes ?Hospitalized since last visit: No ?Patient Requires Transmission-Based Precautions: No ?Implantable device outside of the clinic excluding No ?Patient Has Alerts: Yes ?cellular tissue based products placed in the center ?Patient Alerts: Patient on Blood Thinner since last visit: ?Eliquis Has Dressing in Place as Prescribed: Yes ?Pain Present Now: No ?Electronic Signature(s) ?Signed: 07/07/2021 4:04:33 PM By: Melissa Creamer RN, BSN ?Entered By: Melissa Randolph on 07/06/2021 11:38:17 ?-------------------------------------------------------------------------------- ?Compression Therapy Details ?Patient Name: Date of Service: ?Melissa Randolph C. 07/06/2021 11:30 A M ?Medical Record Number: [id number] ?Patient Account Number: [id number] ?Date of Birth/Sex: Treating RN: ?Melissa Randolph (87 y.o. F) Melissa Randolph ?Primary Care Shantel Wesely: Antony Contras Other Clinician: ?Referring Magic Mohler: ?Treating Davelyn Gwinn/Extender: Kalman Shan ?Antony Contras ?Weeks in Treatment: 2 ?Compression Therapy Performed for Wound  Assessment: Wound #1 Right,Anterior Lower Leg ?Performed By: Clinician Melissa Creamer, RN ?Compression Type: Three Layer ?Post Procedure Diagnosis ?Same as Pre-procedure ?Electronic Signature(s) ?Signed: 07/07/2021 4:04:33 PM By: Melissa Creamer RN, BSN ?Entered By: Melissa Randolph on 07/06/2021 12:03:07 ?-------------------------------------------------------------------------------- ?Encounter Discharge Information Details ?Patient Name: ?Date of Service: ?Melissa Randolph [name] 07/06/2021 11:30 A M ?Medical Record Number: [id number] ?Patient Account Number: [id number] ?Date of Birth/Sex: ?Treating RN: ?Melissa Randolph (87 y.o. F) Melissa Randolph ?Primary Care Everly Rubalcava: Antony Contras ?Other Clinician: ?Referring Nell Schrack: ?Treating Nihira Puello/Extender: Kalman Shan ?Antony Contras ?Weeks in Treatment: 2 ?Encounter Discharge Information Items Post Procedure Vitals ?Discharge Condition: Stable ?Temperature (F): 97.6 ?Ambulatory Status: Ambulatory ?Pulse (bpm): 90 ?Discharge Destination: Home ?Respiratory Rate (breaths/min): 18 ?Transportation: Private Auto ?Blood Pressure (mmHg): 146/88 ?Accompanied By: self ?Schedule Follow-up Appointment: Yes ?Clinical Summary of Care: Patient Declined ?Electronic Signature(s) ?Signed: 07/07/2021 4:04:33 PM By: Melissa Creamer RN, BSN ?Entered By: Melissa Randolph on 07/06/2021 12:29:43 ?-------------------------------------------------------------------------------- ?Lower Extremity Assessment Details ?Patient Name: ?Date of Service: ?Melissa Randolph [name] 07/06/2021 11:30 A M ?Medical Record Number: [id number] ?Patient Account Number: [id number] ?Date of Birth/Sex: ?Treating RN: ?Melissa Randolph (87 y.o. F) Melissa Randolph ?Primary Care Jaielle Dlouhy: Antony Contras ?Other Clinician: ?Referring Lona Six: ?Treating Emelia Sandoval/Extender: Kalman Shan ?Antony Contras ?Weeks in Treatment: 2 ?Edema Assessment ?Assessed: [Left: No] [Right: No] ?Edema: [Left: No] [Right: Yes] ?Calf ?Left: Right: ?Point of  Measurement: 28 cm From Medial Instep 33.5 cm ?Ankle ?Left: Right: ?Point of Measurement: 10 cm From Medial Instep 21 cm ?Vascular Assessment ?Pulses: ?Dorsalis Pedis ?Palpable: [Right:Yes] ?Electronic Signature(s) ?Signed: 07/07/2021 4:04:33 PM By: Melissa Creamer RN, BSN ?Entered By: Melissa Randolph on 07/06/2021 11:42:39 ?-------------------------------------------------------------------------------- ?Multi Wound Chart Details ?Patient Name: ?Date of Service: ?Melissa Randolph [name] 07/06/2021 11:30 A M ?Medical Record Number: [id number] ?Patient Account Number: [id number] ?Date of Birth/Sex: ?Treating RN: ?Melissa Randolph (87 y.o. [name], [name] ?Primary Care Marirose Deveney: Antony Contras ?Other Clinician: ?Referring Aldine Chakraborty: ?Treating Errin Chewning/Extender: Kalman Shan ?Antony Contras ?Weeks in Treatment: 2 ?  Vital Signs ?Height(in): 64 ?Pulse(bpm): 90 ?Weight(lbs): 118 ?Blood Pressure(mmHg): 146/88 ?Body Mass Index(BMI): 20.3 ?Temperature(??F): 97.6 ?Respiratory Rate(breaths/min): 18 ?Photos: [N/A:N/A] ?Right, Anterior Lower Leg N/A N/A ?Wound Location: ?Trauma N/A N/A ?Wounding Event: ?Trauma, Other N/A N/A ?Primary Etiology: ?Asthma, Arrhythmia, Hypertension N/A N/A ?Comorbid History: ?06/04/2021 N/A N/A ?Date Acquired: ?2 N/A N/A ?Weeks of Treatment: ?Open N/A N/A ?Wound Status: ?No N/A N/A ?Wound Recurrence: ?8x7.8x0.2 N/A N/A ?Measurements L x W x D (cm) ?49.009 N/A N/A ?A (cm?) : ?rea ?9.802 N/A N/A ?Volume (cm?) : ?49.30% N/A N/A ?% Reduction in A rea: ?-1.50% N/A N/A ?% Reduction in Volume: ?Full Thickness Without Exposed N/A N/A ?Classification: ?Support Structures ?Medium N/A N/A ?Exudate A mount: ?Serosanguineous N/A N/A ?Exudate Type: ?red, brown N/A N/A ?Exudate Color: ?Flat and Intact N/A N/A ?Wound Margin: ?Medium (34-66%) N/A N/A ?Granulation A mount: ?Red N/A N/A ?Granulation Quality: ?Medium (34-66%) N/A N/A ?Necrotic A mount: ?Eschar, Adherent Slough N/A N/A ?Necrotic Tissue: ?Fat Layer (Subcutaneous  Tissue): Yes N/A N/A ?Exposed Structures: ?Fascia: No ?Tendon: No ?Muscle: No ?Joint: No ?Bone: No ?Medium (34-66%) N/A N/A ?Epithelialization: ?Debridement - Excisional N/A N/A ?Debridement: ?Pre-procedure Verification/Time Out 11:51 N/A N/A ?Taken: ?Other N/A N/A ?Pain Control: ?Subcutaneous, Slough N/A N/A ?Tissue Debrided: ?Skin/Subcutaneous Tissue N/A N/A ?Level: ?24 N/A N/A ?Debridement A (sq cm): ?rea ?Curette N/A N/A ?Instrument: ?Minimum N/A N/A ?Bleeding: ?Pressure N/A N/A ?Hemostasis A chieved: ?3 N/A N/A ?Procedural Pain: ?1 N/A N/A ?Post Procedural Pain: ?Procedure was tolerated well N/A N/A ?Debridement Treatment Response: ?8x7.8x0.2 N/A N/A ?Post Debridement Measurements L x ?W x D (cm) ?9.802 N/A N/A ?Post Debridement Volume: (cm?) ?Compression Therapy N/A N/A ?Procedures Performed: ?Debridement ?Treatment Notes ?Electronic Signature(s) ?Signed: 07/06/2021 12:33:58 PM By: Kalman Shan DO ?Signed: 07/07/2021 4:47:11 PM By: Rhae Hammock RN ?Signed: 07/07/2021 4:47:11 PM By: Rhae Hammock RN ?Entered By: Kalman Shan on 07/06/2021 12:15:38 ?-------------------------------------------------------------------------------- ?Multi-Disciplinary Care Plan Details ?Patient Name: ?Date of Service: ?Melissa Randolph [name] 07/06/2021 11:30 A M ?Medical Record Number: [id number] ?Patient Account Number: [id number] ?Date of Birth/Sex: ?Treating RN: ?Randolph[birth year] (87 y.o. F) Melissa Randolph ?Primary Care Riyaan Heroux: Antony Contras ?Other Clinician: ?Referring Aneya Daddona: ?Treating Jaleeya Mcnelly/Extender: Kalman Shan ?Antony Contras ?Weeks in Treatment: 2 ?Active Inactive ?Abuse / Safety / Falls / Self Care Management ?Nursing Diagnoses: ?History of Falls ?Potential for injury related to falls ?Goals: ?Patient will not experience any injury related to falls ?Date Initiated: 06/22/2021 ?Target Resolution Date: 07/25/2021 ?Goal Status: Active ?Patient will remain injury free related to falls ?Date Initiated:  06/22/2021 ?Target Resolution Date: 07/25/2021 ?Goal Status: Active ?Patient/caregiver will verbalize understanding of skin care regimen ?Date Initiated: 06/22/2021 ?Date Inactivated: 07/06/2021 ?Target Resolution Date: 07/25/2021 ?Goal

## 2021-07-13 ENCOUNTER — Encounter (HOSPITAL_BASED_OUTPATIENT_CLINIC_OR_DEPARTMENT_OTHER): Payer: Medicare Other | Admitting: Internal Medicine

## 2021-07-13 DIAGNOSIS — E782 Mixed hyperlipidemia: Secondary | ICD-10-CM | POA: Diagnosis not present

## 2021-07-13 DIAGNOSIS — Z0000 Encounter for general adult medical examination without abnormal findings: Secondary | ICD-10-CM | POA: Diagnosis not present

## 2021-07-13 DIAGNOSIS — M810 Age-related osteoporosis without current pathological fracture: Secondary | ICD-10-CM | POA: Diagnosis not present

## 2021-07-13 DIAGNOSIS — I482 Chronic atrial fibrillation, unspecified: Secondary | ICD-10-CM | POA: Diagnosis not present

## 2021-07-13 DIAGNOSIS — Z7901 Long term (current) use of anticoagulants: Secondary | ICD-10-CM | POA: Diagnosis not present

## 2021-07-13 DIAGNOSIS — I4891 Unspecified atrial fibrillation: Secondary | ICD-10-CM | POA: Diagnosis not present

## 2021-07-13 DIAGNOSIS — M1990 Unspecified osteoarthritis, unspecified site: Secondary | ICD-10-CM | POA: Diagnosis not present

## 2021-07-13 DIAGNOSIS — L97912 Non-pressure chronic ulcer of unspecified part of right lower leg with fat layer exposed: Secondary | ICD-10-CM | POA: Diagnosis not present

## 2021-07-13 DIAGNOSIS — J4520 Mild intermittent asthma, uncomplicated: Secondary | ICD-10-CM | POA: Diagnosis not present

## 2021-07-13 DIAGNOSIS — R600 Localized edema: Secondary | ICD-10-CM | POA: Diagnosis not present

## 2021-07-13 DIAGNOSIS — J309 Allergic rhinitis, unspecified: Secondary | ICD-10-CM | POA: Diagnosis not present

## 2021-07-13 DIAGNOSIS — I10 Essential (primary) hypertension: Secondary | ICD-10-CM | POA: Diagnosis not present

## 2021-07-13 DIAGNOSIS — D6869 Other thrombophilia: Secondary | ICD-10-CM | POA: Diagnosis not present

## 2021-07-13 DIAGNOSIS — S81801D Unspecified open wound, right lower leg, subsequent encounter: Secondary | ICD-10-CM | POA: Diagnosis not present

## 2021-07-13 NOTE — Progress Notes (Signed)
Melissa Randolph, Melissa Randolph ([id number]) Visit Report for 07/13/2021 Chief Complaint Document Details Patient Name: Date of Service: Melissa Randolph 07/13/2021 8:00 A M Medical Record Number: [id number] Patient Account Number: [id number] Date of Birth/Sex: Treating RN: 1934/03/12 (87 y.o. [name] Primary Care Provider: Antony Contras Other Clinician: Referring Provider: Treating Provider/Extender: Melissa Randolph in Treatment: 3 Information Obtained from: Patient Chief Complaint 06/22/2021; Right lower extremity wound following trauma Electronic Signature(s) Signed: 07/13/2021 9:07:41 AM By: Kalman Shan DO Entered By: Kalman Shan on 07/13/2021 09:03:30 -------------------------------------------------------------------------------- Debridement Details Patient Name: Date of Service: Melissa Randolph [name] 07/13/2021 8:00 A M Medical Record Number: [id number] Patient Account Number: [id number] Date of Birth/Sex: Treating RN: 1934/12/28 (87 y.o. [name] Primary Care Provider: Antony Contras Other Clinician: Referring Provider: Treating Provider/Extender: Melissa Randolph in Treatment: 3 Debridement Performed for Assessment: Wound #1 Right,Anterior Lower Leg Performed By: Physician Kalman Shan, DO Debridement Type: Debridement Level of Consciousness (Pre-procedure): Awake and Alert Pre-procedure Verification/Time Out Yes - 08:17 Taken: Start Time: 08:18 Pain Control: Other : Benzocaine T Area Debrided (L x W): otal 8 (cm) x 6.4 (cm) = 51.2 (cm) Tissue and other material debrided: Non-Viable, Slough, Subcutaneous, Slough Level: Skin/Subcutaneous Tissue Debridement Description: Excisional Instrument: Curette Bleeding: Minimum Hemostasis Achieved: Pressure End Time: 08:22 Response to Treatment: Procedure was tolerated well Level of Consciousness (Post- Awake and Alert procedure): Post Debridement Measurements  of Total Wound Length: (cm) 8 Width: (cm) 6.4 Depth: (cm) 0.2 Volume: (cm) 8.042 Character of Wound/Ulcer Post Debridement: Stable Post Procedure Diagnosis Same as Pre-procedure Electronic Signature(s) Signed: 07/13/2021 9:07:41 AM By: Kalman Shan DO Signed: 07/13/2021 4:27:47 PM By: Lorrin Jackson Entered By: Lorrin Jackson on 07/13/2021 08:22:12 -------------------------------------------------------------------------------- HPI Details Patient Name: Date of Service: Melissa Randolph [name] 07/13/2021 8:00 A M Medical Record Number: [id number] Patient Account Number: [id number] Date of Birth/Sex: Treating RN: 1934/05/14 (87 y.o. [name] Primary Care Provider: Antony Contras Other Clinician: Referring Provider: Treating Provider/Extender: Melissa Randolph in Treatment: 3 History of Present Illness HPI Description: Admission 06/22/2021 [name] is an 87 year old female with a past medical history of A-fib on Eliquis that presents to the clinic for a wound to her right lower extremity. She fell over a wooden baseboard on 4/13. She went to the ED for evaluation and she had 15 stitches placed. She had an x-ray of her leg that showed no acute osseous abnormality. She has been placing Xeroform over the wound bed intermittently. She visited the ED again on 4/21 because of wound infection. She was started on doxycycline and cefadroxil. She states she has a couple days remaining. She currently denies systemic signs of infection. 5/8; patient presents for follow-up. She has been using bacitracin and Xeroform to the wound bed. She reports significant improvement in her pain to the wound bed. She states she is using her compression stockings daily. She denies signs of infection. 5/15; patient presents for follow-up. She has been using Iodoflex to the wound bed. She has no issues or complaints today. She has been using her compression stocking daily. She  went to vein and vascular to have her ABIs with TBI's completed. On the right she had an ABI of 1.17 and TBI of 0.68. 5/22; patient presents for follow-up. We switched to [facility] and antibiotic ointment under a compression wrap at last clinic visit. She tolerated the wrap well. She has no issues or complaints today. She denies  signs of infection. Electronic Signature(s) Signed: 07/13/2021 9:07:41 AM By: Kalman Shan DO Entered By: Kalman Shan on 07/13/2021 09:03:59 -------------------------------------------------------------------------------- Physical Exam Details Patient Name: Date of Service: Melissa Randolph 07/13/2021 8:00 A M Medical Record Number: [id number] Patient Account Number: [id number] Date of Birth/Sex: Treating RN: [birth year] (87 y.o. [name] Primary Care Provider: Antony Contras Other Clinician: Referring Provider: Treating Provider/Extender: Melissa Randolph in Treatment: 3 Constitutional respirations regular, non-labored and within target range for patient.. Cardiovascular 2+ dorsalis pedis/posterior tibialis pulses. Psychiatric pleasant and cooperative. Notes Right lower extremity: Large open wound to the anterior aspect with nonviable tissue and granulation tissue present. No surrounding signs of infection. Good edema control. Electronic Signature(s) Signed: 07/13/2021 9:07:41 AM By: Kalman Shan DO Entered By: Kalman Shan on 07/13/2021 09:05:10 -------------------------------------------------------------------------------- Physician Orders Details Patient Name: Date of Service: Melissa Randolph [name] 07/13/2021 8:00 A M Medical Record Number: [id number] Patient Account Number: [id number] Date of Birth/Sex: Treating RN: 11/27/1934 (87 y.o. 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 - Dr Heber [place] and [facility] Room # 7 07/21/21 8:45am Return A Bathing/ Shower/ Hygiene May shower with protection but do not get wound dressing(s) wet. - purchase cast protector from [facility], [facility], [employer] Edema Control - Lymphedema / SCD / Other Elevate legs to the level of the heart or above for 30 minutes daily and/or when sitting, a frequency of: - 2-3 times per day void standing for long periods of time. - limit time standing still, sit when possible, wear compression stockings during day A Exercise regularly - light walking Moisturize legs daily. - apply to leg, do not get on open wound Wound Treatment Wound #1 - Lower Leg Wound Laterality: Right, Anterior Cleanser: Soap and Water 1 x Per Week/30 Days Discharge Instructions: May shower and wash wound with dial antibacterial soap and water prior to dressing change. Cleanser: Byram Ancillary Kit - 15 Day Supply (Generic) 1 x Per Week/30 Days Discharge Instructions: Use supplies as instructed; Kit contains: (15) Saline Bullets; (15) 3x3 Gauze; 15 pr Gloves Peri-Wound Care: Sween Lotion (Moisturizing lotion) 1 x Per Week/30 Days Discharge Instructions: Apply moisturizing lotion as directed Topical: Gentamicin 1 x Per Week/30 Days Discharge Instructions: As directed by physician Topical: Mupirocin Ointment 1 x Per Week/30 Days Discharge Instructions: Apply Mupirocin (Bactroban) as instructed Prim Dressing: Hydrofera Blue Ready Foam, 4x5 in 1 x Per Week/30  Days ary Discharge Instructions: Apply to wound bed as instructed Secondary Dressing: ABD Pad, 8x10 1 x Per Week/30 Days Discharge Instructions: Apply over primary dressing as directed. Compression Wrap: ThreePress (3 layer compression wrap) 1 x Per Week/30 Days Discharge Instructions: Apply three layer compression as directed. Electronic Signature(s) Signed: 07/13/2021 9:07:41 AM By: Kalman Shan DO Entered By: Kalman Shan on 07/13/2021 09:05:29 -------------------------------------------------------------------------------- Problem List Details Patient Name: Date of Service: Melissa Randolph [name] 07/13/2021 8:00 A M Medical Record Number: [id number] Patient Account Number: [id number] Date of Birth/Sex: Treating RN: 28-Nov-1934 (87 y.o. [name] Primary Care Provider: Antony Contras Other Clinician: Referring  Provider: Treating Provider/Extender: Melissa Randolph in Treatment: 3 Active Problems ICD-10 Encounter Code Description Active Date MDM Diagnosis F35.456 Non-pressure chronic ulcer of unspecified part of right lower leg with fat layer 06/22/2021 No Yes exposed T79.8XXA Other early complications of trauma, initial encounter 06/22/2021 No Yes I48.20 Chronic atrial fibrillation, unspecified 06/22/2021 No Yes Z79.01 Long term (current) use of anticoagulants 06/22/2021 No Yes I10 Essential (primary) hypertension 06/22/2021 No Yes I87.2 Venous insufficiency (chronic) (peripheral) 07/06/2021 No Yes Inactive Problems Resolved Problems Electronic Signature(s) Signed: 07/13/2021 9:07:41 AM By: Kalman Shan DO Entered By: Kalman Shan on 07/13/2021 09:03:17 -------------------------------------------------------------------------------- Progress Note Details Patient Name: Date of Service: Melissa Randolph [name] 07/13/2021 8:00 A M Medical Record Number: [id number] Patient Account Number: [id number] Date of Birth/Sex: Treating RN: 1934/08/02 (87  y.o. [name] Primary Care Provider: Antony Contras Other Clinician: Referring Provider: Treating Provider/Extender: Melissa Randolph in Treatment: 3 Subjective Chief Complaint Information obtained from Patient 06/22/2021; Right lower extremity wound following trauma History of Present Illness (HPI) Admission 06/22/2021 Ms. Marelin Tat is an 87 year old female with a past medical history of A-fib on Eliquis that presents to the clinic for a wound to her right lower extremity. She fell over a wooden baseboard on 4/13. She went to the ED for evaluation and she had 15 stitches placed. She had an x-ray of her leg that showed no acute osseous abnormality. She has been placing Xeroform over the wound bed intermittently. She visited the ED again on 4/21 because of wound infection. She was started on doxycycline and cefadroxil. She states she has a couple days remaining. She currently denies systemic signs of infection. 5/8; patient presents for follow-up. She has been using bacitracin and Xeroform to the wound bed. She reports significant improvement in her pain to the wound bed. She states she is using her compression stockings daily. She denies signs of infection. 5/15; patient presents for follow-up. She has been using Iodoflex to the wound bed. She has no issues or complaints today. She has been using her compression stocking daily. She went to vein and vascular to have her ABIs with TBI's completed. On the right she had an ABI of 1.17 and TBI of 0.68. 5/22; patient presents for follow-up. We switched to [facility] and antibiotic ointment under a compression wrap at last clinic visit. She tolerated the wrap well. She has no issues or complaints today. She denies signs of infection. Patient History Information obtained from Patient. Family History Cancer - Father, Heart Disease - Father, Hypertension - Father, No family history of Diabetes, Kidney Disease, Lung  Disease, Seizures, Stroke, Thyroid Problems, Tuberculosis. Social History Never smoker, Marital Status - Married, Alcohol Use - Never, Drug Use - No History, Caffeine Use - Never. Medical History Respiratory Patient has history of Asthma Cardiovascular Patient has history of Arrhythmia - A-fib, Hypertension Psychiatric Denies history of Anorexia/bulimia, Confinement Anxiety Hospitalization/Surgery History - breast surgery 8 years ago. Medical A Surgical History Notes nd Eyes cataract surgery both eyes Cardiovascular hypercholesterolemia Objective Constitutional respirations regular, non-labored and within target range for patient.. Vitals Time Taken: 8:10 AM, Height: 64 in, Weight: 118 lbs, BMI: 20.3, Temperature: 97.9 F, Pulse: 78 bpm, Respiratory Rate: 16 breaths/min, Blood Pressure: 149/72 mmHg. Cardiovascular 2+ dorsalis pedis/posterior tibialis pulses. Psychiatric pleasant and cooperative. General Notes: Right lower extremity: Large open wound to the anterior aspect with nonviable tissue and granulation tissue present. No surrounding signs of infection. Good edema control. Integumentary (Hair, Skin) Wound #  1 status is Open. Original cause of wound was Trauma. The date acquired was: 06/04/2021. The wound has been in treatment 3 weeks. The wound is located on the Right,Anterior Lower Leg. The wound measures 8cm length x 6.4cm width x 0.2cm depth; 40.212cm^2 area and 8.042cm^3 volume. There is Fat Layer (Subcutaneous Tissue) exposed. There is no tunneling or undermining noted. There is a medium amount of serosanguineous drainage noted. The wound margin is distinct with the outline attached to the wound base. There is medium (34-66%) red granulation within the wound bed. There is a medium (34-66%) amount of necrotic tissue within the wound bed including Eschar and Adherent Slough. Assessment Active Problems ICD-10 Non-pressure chronic ulcer of unspecified part of right lower  leg with fat layer exposed Other early complications of trauma, initial encounter Chronic atrial fibrillation, unspecified Long term (current) use of anticoagulants Essential (primary) hypertension Venous insufficiency (chronic) (peripheral) Patient's wound has shown improvement in size and appearance since last clinic visit. I debrided nonviable tissue. I recommended continuing the course with Hydrofera Blue and antibiotic ointment under 3 layer compression. Follow-up in 1 week. Procedures Wound #1 Pre-procedure diagnosis of Wound #1 is a Trauma, Other located on the Right,Anterior Lower Leg . There was a Excisional Skin/Subcutaneous Tissue Debridement with a total area of 51.2 sq cm performed by Kalman Shan, DO. With the following instrument(s): Curette to remove Non-Viable tissue/material. Material removed includes Subcutaneous Tissue and Slough and after achieving pain control using Other (Benzocaine). No specimens were taken. A time out was conducted at 08:17, prior to the start of the procedure. A Minimum amount of bleeding was controlled with Pressure. The procedure was tolerated well. Post Debridement Measurements: 8cm length x 6.4cm width x 0.2cm depth; 8.042cm^3 volume. Character of Wound/Ulcer Post Debridement is stable. Post procedure Diagnosis Wound #1: Same as Pre-Procedure Pre-procedure diagnosis of Wound #1 is a Trauma, Other located on the Right,Anterior Lower Leg . There was a Three Layer Compression Therapy Procedure by Lorrin Jackson, RN. Post procedure Diagnosis Wound #1: Same as Pre-Procedure Plan Follow-up Appointments: Return Appointment in 1 week. - Dr Heber [place] and [facility] Room # 7 07/21/21 8:45am Bathing/ Shower/ Hygiene: May shower with protection but do not get wound dressing(s) wet. - purchase cast protector from [facility], [facility], [place] Edema Control - Lymphedema / SCD / Other: Elevate legs to the level of the heart or above for 30 minutes daily and/or when  sitting, a frequency of: - 2-3 times per day Avoid standing for long periods of time. - limit time standing still, sit when possible, wear compression stockings during day Exercise regularly - light walking Moisturize legs daily. - apply to leg, do not get on open wound WOUND #1: - Lower Leg Wound Laterality: Right, Anterior Cleanser: Soap and Water 1 x Per Week/30 Days Discharge Instructions: May shower and wash wound with dial antibacterial soap and water prior to dressing change. Cleanser: Byram Ancillary Kit - 15 Day Supply (Generic) 1 x Per Week/30 Days Discharge Instructions: Use supplies as instructed; Kit contains: (15) Saline Bullets; (15) 3x3 Gauze; 15 pr Gloves Peri-Wound Care: Sween Lotion (Moisturizing lotion) 1 x Per Week/30 Days Discharge Instructions: Apply moisturizing lotion as directed Topical: Gentamicin 1 x Per Week/30 Days Discharge Instructions: As directed by physician Topical: Mupirocin Ointment 1 x Per Week/30 Days Discharge Instructions: Apply Mupirocin (Bactroban) as instructed Prim Dressing: Hydrofera Blue Ready Foam, 4x5 in 1 x Per Week/30 Days ary Discharge Instructions: Apply to wound bed as instructed Secondary Dressing: ABD Pad,  8x10 1 x Per Week/30 Days Discharge Instructions: Apply over primary dressing as directed. Com pression Wrap: ThreePress (3 layer compression wrap) 1 x Per Week/30 Days Discharge Instructions: Apply three layer compression as directed. 1. In office sharp debridement 2. Hydrofera Blue with antibiotic ointment under 3 layer compression 3. Follow-up in 1 week Electronic Signature(s) Signed: 07/13/2021 9:07:41 AM By: Kalman Shan DO Entered By: Kalman Shan on 07/13/2021 09:06:14 -------------------------------------------------------------------------------- HxROS Details Patient Name: Date of Service: Melissa Randolph [name] 07/13/2021 8:00 A M Medical Record Number: [id number] Patient Account Number: [id number] Date of  Birth/Sex: Treating RN: [birth year] (87 y.o. [name] Primary Care Provider: Antony Contras Other Clinician: Referring Provider: Treating Provider/Extender: Melissa Randolph in Treatment: 3 Information Obtained From Patient Eyes Medical History: Past Medical History Notes: cataract surgery both eyes Respiratory Medical History: Positive for: Asthma Cardiovascular Medical History: Positive for: Arrhythmia - A-fib; Hypertension Past Medical History Notes: hypercholesterolemia Psychiatric Medical History: Negative for: Anorexia/bulimia; Confinement Anxiety Immunizations Pneumococcal Vaccine: Received Pneumococcal Vaccination: Yes Received Pneumococcal Vaccination On or After 60th Birthday: Yes Implantable Devices None Hospitalization / Surgery History Type of Hospitalization/Surgery breast surgery 8 years ago Family and Social History Cancer: Yes - Father; Diabetes: No; Heart Disease: Yes - Father; Hypertension: Yes - Father; Kidney Disease: No; Lung Disease: No; Seizures: No; Stroke: No; Thyroid Problems: No; Tuberculosis: No; Never smoker; Marital Status - Married; Alcohol Use: Never; Drug Use: No History; Caffeine Use: Never; Financial Concerns: No; Food, Clothing or Shelter Needs: No; Support System Lacking: No; Transportation Concerns: No Electronic Signature(s) Signed: 07/13/2021 9:07:41 AM By: Kalman Shan DO Signed: 07/13/2021 4:27:47 PM By: Lorrin Jackson Entered By: Kalman Shan on 07/13/2021 09:04:03 -------------------------------------------------------------------------------- SuperBill Details Patient Name: Date of Service: Melissa Randolph 07/13/2021 Medical Record Number: [id number] Patient Account Number: [id number] Date of Birth/Sex: Treating RN: Dec 02, 1934 (87 y.o. 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 T79.8XXA Other early complications of trauma, initial encounter I48.20 Chronic atrial fibrillation, unspecified Z79.01 Long term (current) use of anticoagulants I10 Essential (primary) hypertension I87.2 Venous insufficiency (chronic) (peripheral) Facility Procedures CPT4 Code: [id number] Description: 94854 - DEB SUBQ TISSUE 20 SQ CM/< ICD-10 Diagnosis Description O27.035 Non-pressure chronic ulcer of unspecified part of right lower leg with fat laye Modifier: r exposed Quantity: 1 CPT4 Code: [id number] Description: 11045 - DEB SUBQ TISS EA ADDL 20CM ICD-10 Diagnosis Description X93.716 Non-pressure chronic ulcer of unspecified part of right lower leg with fat laye Modifier: r exposed Quantity: 2 Physician Procedures : CPT4 Code Description Modifier [id number] 10175 - WC PHYS SUBQ TISS 20 SQ CM ICD-10 Diagnosis Description Z02.585 Non-pressure chronic ulcer of unspecified part of right lower leg with fat layer exposed Quantity: 1 : [id number] 35361 - WC PHYS SUBQ TISS EA ADDL 20 CM ICD-10 Diagnosis Description W43.154 Non-pressure chronic ulcer of unspecified part of right lower leg with fat layer exposed Quantity: 2 Electronic Signature(s) Signed: 07/13/2021 9:07:41 AM By: Kalman Shan DO Entered By: Kalman Shan on 07/13/2021 00:86:76

## 2021-07-13 NOTE — Progress Notes (Signed)
JUNITA, KUBOTA ([id number]) Visit Report for 07/13/2021 Arrival Information Details Patient Name: Date of Service: Melissa Randolph 07/13/2021 8:00 A M Medical Record Number: [id number] Patient Account Number: [id number] Date of Birth/Sex: Treating RN: 27-Jul-1934 (87 y.o. Melissa Randolph Primary Care Veida Spira: Antony Contras Other Clinician: Referring Jalal Rauch: Treating Zainab Crumrine/Extender: Olivia Mackie in Treatment: 3 Visit Information History Since Last Visit Added or deleted any medications: No Patient Arrived: Ambulatory Any new allergies or adverse reactions: No Arrival Time: 08:00 Had a fall or experienced change in No Transfer Assistance: None activities of daily living that may affect Patient Identification Verified: Yes risk of falls: Secondary Verification Process Completed: Yes Signs or symptoms of abuse/neglect since last visito No Patient Requires Transmission-Based Precautions: No Hospitalized since last visit: No Patient Has Alerts: Yes Implantable device outside of the clinic excluding No Patient Alerts: Patient on Blood Thinner cellular tissue based products placed in the center Eliquis since last visit: Has Dressing in Place as Prescribed: Yes Has Compression in Place as Prescribed: Yes Pain Present Now: No Electronic Signature(s) Signed: 07/13/2021 4:27:47 PM By: Lorrin Jackson Entered By: Lorrin Jackson on 07/13/2021 08:01:56 -------------------------------------------------------------------------------- Compression Therapy Details Patient Name: Date of Service: Melissa Leach C. 07/13/2021 8:00 A M Medical Record Number: [id number] Patient Account Number: [id number] Date of Birth/Sex: Treating RN: 1935-02-10 (87 y.o. Melissa Randolph Primary Care Bently Morath: Antony Contras Other Clinician: Referring Marlie Kuennen: Treating Ariana Juul/Extender: Olivia Mackie in Treatment: 3 Compression Therapy  Performed for Wound Assessment: Wound #1 Right,Anterior Lower Leg Performed By: Clinician Lorrin Jackson, RN Compression Type: Three Layer Post Procedure Diagnosis Same as Pre-procedure Electronic Signature(s) Signed: 07/13/2021 4:27:47 PM By: Lorrin Jackson Entered By: Lorrin Jackson on 07/13/2021 08:22:31 -------------------------------------------------------------------------------- Encounter Discharge Information Details Patient Name: Date of Service: Melissa Bryant [name] 07/13/2021 8:00 A M Medical Record Number: [id number] Patient Account Number: [id number] Date of Birth/Sex: Treating RN: 26-Dec-1934 (87 y.o. Melissa Randolph Primary Care Duilio Heritage: Antony Contras Other Clinician: Referring Jaylan Hinojosa: Treating Essie Lagunes/Extender: Olivia Mackie in Treatment: 3 Encounter Discharge Information Items Post Procedure Vitals Discharge Condition: Stable Temperature (F): 97.9 Ambulatory Status: Ambulatory Pulse (bpm): 78 Discharge Destination: Home Respiratory Rate (breaths/min): 16 Transportation: Private Auto Blood Pressure (mmHg): 149/72 Schedule Follow-up Appointment: Yes Clinical Summary of Care: Provided on 07/13/2021 Form Type Recipient Paper Patient Patient Electronic Signature(s) Signed: 07/13/2021 4:27:47 PM By: Lorrin Jackson Entered By: Lorrin Jackson on 07/13/2021 08:41:39 -------------------------------------------------------------------------------- Lower Extremity Assessment Details Patient Name: Date of Service: Melissa Bryant [name] 07/13/2021 8:00 A M Medical Record Number: [id number] Patient Account Number: [id number] Date of Birth/Sex: Treating RN: 24-May-1934 (87 y.o. Melissa Randolph Primary Care Melinna Linarez: Antony Contras Other Clinician: Referring Ayush Boulet: Treating Henryetta Corriveau/Extender: Olivia Mackie in Treatment: 3 Edema Assessment Assessed: [name]: No] [name]: Yes] Edema: [Left: Ye] [Right: s] Calf Left:  Right: Point of Measurement: 28 cm From Medial Instep 31.5 cm Ankle Left: Right: Point of Measurement: 10 cm From Medial Instep 21 cm Vascular Assessment Pulses: Dorsalis Pedis Palpable: [Right:Yes] Electronic Signature(s) Signed: 07/13/2021 4:27:47 PM By: Lorrin Jackson Entered By: Lorrin Jackson on 07/13/2021 08:06:44 -------------------------------------------------------------------------------- Multi Wound Chart Details Patient Name: Date of Service: Melissa Bryant [name] 07/13/2021 8:00 A M Medical Record Number: [id number] Patient Account Number: [id number] Date of Birth/Sex: Treating RN: 1934-12-01 (87 y.o. Melissa Randolph Primary Care Seger Jani: Antony Contras Other Clinician: Referring Maridel Pixler: Treating Eldon Zietlow/Extender: Olivia Mackie in Treatment: 3  Vital Signs Height(in): 64 Pulse(bpm): 78 Weight(lbs): 118 Blood Pressure(mmHg): 149/72 Body Mass Index(BMI): 20.3 Temperature(F): 97.9 Respiratory Rate(breaths/min): 16 Photos: [N/A:N/A] Right, Anterior Lower Leg N/A N/A Wound Location: Trauma N/A N/A Wounding Event: Trauma, Other N/A N/A Primary Etiology: Asthma, Arrhythmia, Hypertension N/A N/A Comorbid History: 06/04/2021 N/A N/A Date Acquired: 3 N/A N/A Weeks of Treatment: Open N/A N/A Wound Status: No N/A N/A Wound Recurrence: 8x6.4x0.2 N/A N/A Measurements L x W x D (cm) 40.212 N/A N/A A (cm) : rea 8.042 N/A N/A Volume (cm) : 58.40% N/A N/A % Reduction in A rea: 16.70% N/A N/A % Reduction in Volume: Full Thickness Without Exposed N/A N/A Classification: Support Structures Medium N/A N/A Exudate A mount: Serosanguineous N/A N/A Exudate Type: red, brown N/A N/A Exudate Color: Distinct, outline attached N/A N/A Wound Margin: Medium (34-66%) N/A N/A Granulation A mount: Red N/A N/A Granulation Quality: Medium (34-66%) N/A N/A Necrotic A mount: Eschar, Adherent Slough N/A N/A Necrotic Tissue: Fat Layer  (Subcutaneous Tissue): Yes N/A N/A Exposed Structures: Fascia: No Tendon: No Muscle: No Joint: No Bone: No Medium (34-66%) N/A N/A Epithelialization: Debridement - Excisional N/A N/A Debridement: Pre-procedure Verification/Time Out 08:17 N/A N/A Taken: Other N/A N/A Pain Control: Subcutaneous, Slough N/A N/A Tissue Debrided: Skin/Subcutaneous Tissue N/A N/A Level: 51.2 N/A N/A Debridement A (sq cm): rea Curette N/A N/A Instrument: Minimum N/A N/A Bleeding: Pressure N/A N/A Hemostasis A chieved: Procedure was tolerated well N/A N/A Debridement Treatment Response: 8x6.4x0.2 N/A N/A Post Debridement Measurements L x W x D (cm) 8.042 N/A N/A Post Debridement Volume: (cm) Compression Therapy N/A N/A Procedures Performed: Debridement Treatment Notes Wound #1 (Lower Leg) Wound Laterality: Right, Anterior Cleanser Soap and Water Discharge Instruction: May shower and wash wound with dial antibacterial soap and water prior to dressing change. Byram Ancillary Kit - 15 Day Supply Discharge Instruction: Use supplies as instructed; Kit contains: (15) Saline Bullets; (15) 3x3 Gauze; 15 pr Gloves Peri-Wound Care Sween Lotion (Moisturizing lotion) Discharge Instruction: Apply moisturizing lotion as directed Topical Gentamicin Discharge Instruction: As directed by physician Mupirocin Ointment Discharge Instruction: Apply Mupirocin (Bactroban) as instructed Primary Dressing Hydrofera Blue Ready Foam, 4x5 in Discharge Instruction: Apply to wound bed as instructed Secondary Dressing ABD Pad, 8x10 Discharge Instruction: Apply over primary dressing as directed. Secured With Compression Wrap ThreePress (3 layer compression wrap) Discharge Instruction: Apply three layer compression as directed. Compression Stockings Add-Ons Electronic Signature(s) Signed: 07/13/2021 9:07:41 AM By: Kalman Shan DO Signed: 07/13/2021 4:27:47 PM By: Lorrin Jackson Entered By: Kalman Shan on 07/13/2021 09:03:21 -------------------------------------------------------------------------------- Multi-Disciplinary Care Plan Details Patient Name: Date of Service: Melissa Bryant [name] 07/13/2021 8:00 A M Medical Record Number: [id number] Patient Account Number: [id number] Date of Birth/Sex: Treating RN: 12-07-1934 (87 y.o. Melissa Randolph Primary Care Briann Sarchet: Antony Contras Other Clinician: Referring Kayon Dozier: Treating Gilbert Narain/Extender: Olivia Mackie in Treatment: 3 Active Inactive Abuse / Safety / Falls / Self Care Management Nursing Diagnoses: History of Falls Potential for injury related to falls Goals: Patient will not experience any injury related to falls Date Initiated: 06/22/2021 Target Resolution Date: 07/25/2021 Goal Status: Active Patient will remain injury free related to falls Date Initiated: 06/22/2021 Target Resolution Date: 07/25/2021 Goal Status: Active Patient/caregiver will verbalize understanding of skin care regimen Date Initiated: 06/22/2021 Date Inactivated: 07/06/2021 Target Resolution Date: 07/25/2021 Goal Status: Met Patient/caregiver will verbalize/demonstrate measures taken to prevent injury and/or falls Date Initiated: 06/22/2021 Target Resolution Date: 07/25/2021 Goal Status: Active Interventions: Assess fall risk on admission  and as needed Assess self care needs on admission and as needed Provide education on basic hygiene Provide education on fall prevention Treatment Activities: Education provided on Basic Hygiene : 07/06/2021 Notes: Wound/Skin Impairment Nursing Diagnoses: Impaired tissue integrity Goals: Patient/caregiver will verbalize understanding of skin care regimen Date Initiated: 06/22/2021 Date Inactivated: 07/06/2021 Target Resolution Date: 07/25/2021 Goal Status: Met Ulcer/skin breakdown will have a volume reduction of 30% by week 4 Date Initiated: 06/22/2021 Target Resolution Date: 07/25/2021 Goal  Status: Active Interventions: Assess ulceration(s) every visit Provide education on ulcer and skin care Notes: Electronic Signature(s) Signed: 07/13/2021 4:27:47 PM By: Lorrin Jackson Entered By: Lorrin Jackson on 07/13/2021 07:59:53 -------------------------------------------------------------------------------- Pain Assessment Details Patient Name: Date of Service: Melissa Leach C. 07/13/2021 8:00 A M Medical Record Number: [id number] Patient Account Number: [id number] Date of Birth/Sex: Treating RN: May 14, 1934 (87 y.o. Melissa Randolph Primary Care Kelena Garrow: Antony Contras Other Clinician: Referring Jasia Hiltunen: Treating Cannie Muckle/Extender: Olivia Mackie in Treatment: 3 Active Problems Location of Pain Severity and Description of Pain Patient Has Paino No Site Locations Pain Management and Medication Current Pain Management: Electronic Signature(s) Signed: 07/13/2021 4:27:47 PM By: Lorrin Jackson Entered By: Lorrin Jackson on 07/13/2021 08:06:25 -------------------------------------------------------------------------------- Patient/Caregiver Education Details Patient Name: Date of Service: Melissa Randolph 5/22/2023andnbsp8:00 A M Medical Record Number: [id number] Patient Account Number: [id number] Date of Birth/Gender: Treating RN: 1934/10/04 (87 y.o. Melissa Randolph Primary Care Physician: Antony Contras Other Clinician: Referring Physician: Treating Physician/Extender: Olivia Mackie in Treatment: 3 Education Assessment Education Provided To: Patient Education Topics Provided Safety: Methods: Explain/Verbal, Printed Responses: State content correctly Wound/Skin Impairment: Methods: Explain/Verbal, Printed Responses: State content correctly Electronic Signature(s) Signed: 07/13/2021 4:27:47 PM By: Lorrin Jackson Entered By: Lorrin Jackson on 07/13/2021  08:00:11 -------------------------------------------------------------------------------- Wound Assessment Details Patient Name: Date of Service: Melissa Leach C. 07/13/2021 8:00 A M Medical Record Number: [id number] Patient Account Number: [id number] Date of Birth/Sex: Treating RN: 10-15-1934 (87 y.o. Melissa Randolph Primary Care Alonte Wulff: Antony Contras Other Clinician: Referring Quandra Fedorchak: Treating Vaneta Hammontree/Extender: Olivia Mackie in Treatment: 3 Wound Status Wound Number: 1 Primary Etiology: Trauma, Other Wound Location: Right, Anterior Lower Leg Wound Status: Open Wounding Event: Trauma Comorbid History: Asthma, Arrhythmia, Hypertension Date Acquired: 06/04/2021 Weeks Of Treatment: 3 Clustered Wound: No Photos Wound Measurements Length: (cm) 8 Width: (cm) 6.4 Depth: (cm) 0.2 Area: (cm) 40.212 Volume: (cm) 8.042 % Reduction in Area: 58.4% % Reduction in Volume: 16.7% Epithelialization: Medium (34-66%) Tunneling: No Undermining: No Wound Description Classification: Full Thickness Without Exposed Support Structures Wound Margin: Distinct, outline attached Exudate Amount: Medium Exudate Type: Serosanguineous Exudate Color: red, brown Foul Odor After Cleansing: No Slough/Fibrino Yes Wound Bed Granulation Amount: Medium (34-66%) Exposed Structure Granulation Quality: Red Fascia Exposed: No Necrotic Amount: Medium (34-66%) Fat Layer (Subcutaneous Tissue) Exposed: Yes Necrotic Quality: Eschar, Adherent Slough Tendon Exposed: No Muscle Exposed: No Joint Exposed: No Bone Exposed: No Treatment Notes Wound #1 (Lower Leg) Wound Laterality: Right, Anterior Cleanser Soap and Water Discharge Instruction: May shower and wash wound with dial antibacterial soap and water prior to dressing change. Byram Ancillary Kit - 15 Day Supply Discharge Instruction: Use supplies as instructed; Kit contains: (15) Saline Bullets; (15) 3x3 Gauze; 15 pr  Gloves Peri-Wound Care Sween Lotion (Moisturizing lotion) Discharge Instruction: Apply moisturizing lotion as directed Topical Gentamicin Discharge Instruction: As directed by physician Mupirocin Ointment Discharge Instruction: Apply Mupirocin (Bactroban) as instructed Primary Dressing Hydrofera Blue Ready Foam, 4x5 in Discharge  Instruction: Apply to wound bed as instructed Secondary Dressing ABD Pad, 8x10 Discharge Instruction: Apply over primary dressing as directed. Secured With Compression Wrap ThreePress (3 layer compression wrap) Discharge Instruction: Apply three layer compression as directed. Compression Stockings Add-Ons Electronic Signature(s) Signed: 07/13/2021 4:27:47 PM By: Lorrin Jackson Entered By: Lorrin Jackson on 07/13/2021 08:08:59 -------------------------------------------------------------------------------- [place] Details Patient Name: Date of Service: Melissa Bryant [name] 07/13/2021 8:00 A M Medical Record Number: [id number] Patient Account Number: [id number] Date of Birth/Sex: Treating RN: 1934-04-08 (87 y.o. Melissa Randolph Primary Care Relda Agosto: Antony Contras Other Clinician: Referring Lenix Kidd: Treating Prabhleen Montemayor/Extender: Olivia Mackie in Treatment: 3 Vital Signs Time Taken: 08:10 Temperature (F): 97.9 Height (in): 64 Pulse (bpm): 78 Weight (lbs): 118 Respiratory Rate (breaths/min): 16 Body Mass Index (BMI): 20.3 Blood Pressure (mmHg): 149/72 Reference Range: 80 - 120 mg / dl Electronic Signature(s) Signed: 07/13/2021 4:27:47 PM By: Lorrin Jackson Entered By: Lorrin Jackson on 07/13/2021 08:10:43

## 2021-07-21 ENCOUNTER — Encounter (HOSPITAL_BASED_OUTPATIENT_CLINIC_OR_DEPARTMENT_OTHER): Payer: Medicare Other | Admitting: Internal Medicine

## 2021-07-21 DIAGNOSIS — I10 Essential (primary) hypertension: Secondary | ICD-10-CM | POA: Diagnosis not present

## 2021-07-21 DIAGNOSIS — I872 Venous insufficiency (chronic) (peripheral): Secondary | ICD-10-CM | POA: Diagnosis not present

## 2021-07-21 DIAGNOSIS — L97912 Non-pressure chronic ulcer of unspecified part of right lower leg with fat layer exposed: Secondary | ICD-10-CM | POA: Diagnosis not present

## 2021-07-21 DIAGNOSIS — I482 Chronic atrial fibrillation, unspecified: Secondary | ICD-10-CM | POA: Diagnosis not present

## 2021-07-21 DIAGNOSIS — T798XXA Other early complications of trauma, initial encounter: Secondary | ICD-10-CM

## 2021-07-21 DIAGNOSIS — Z7901 Long term (current) use of anticoagulants: Secondary | ICD-10-CM | POA: Diagnosis not present

## 2021-07-21 NOTE — Progress Notes (Signed)
Melissa Randolph, EGNEW ([id number]) Visit Report for 07/21/2021 Chief Complaint Document Details Patient Name: Date of Service: [name] 07/21/2021 8:45 A M Medical Record Number: [id number] Patient Account Number: [id number] Date of Birth/Sex: Treating RN: 07-19-1934 (87 y.o. [name] Primary Care Provider: Antony Contras Other Clinician: Referring Provider: Treating Provider/Extender: Olivia Mackie in Treatment: 4 Information Obtained from: Patient Chief Complaint 06/22/2021; Right lower extremity wound following trauma Electronic Signature(s) Signed: 07/21/2021 10:06:48 AM By: Kalman Shan DO Entered By: Kalman Shan on 07/21/2021 10:02:42 -------------------------------------------------------------------------------- Debridement Details Patient Name: Date of Service: Melissa Randolph C. 07/21/2021 8:45 A M Medical Record Number: [id number] Patient Account Number: [id number] Date of Birth/Sex: Treating RN: 1934/03/13 (87 y.o. [name] Primary Care Provider: Antony Contras Other Clinician: Referring Provider: Treating Provider/Extender: Olivia Mackie in Treatment: 4 Debridement Performed for Assessment: Wound #1 Right,Anterior Lower Leg Performed By: Physician Kalman Shan, DO Debridement Type: Debridement Level of Consciousness (Pre-procedure): Awake and Alert Pre-procedure Verification/Time Out Yes - 09:27 Taken: Start Time: 09:28 Pain Control: Other : Benzocaine T Area Debrided (L x W): otal 6.5 (cm) x 4.7 (cm) = 30.55 (cm) Tissue and other material debrided: Non-Viable, Slough, Subcutaneous, Slough Level: Skin/Subcutaneous Tissue Debridement Description: Excisional Instrument: Curette Bleeding: Minimum Hemostasis Achieved: Pressure End Time: 09:33 Response to Treatment: Procedure was tolerated well Level of Consciousness (Post- Awake and Alert procedure): Post Debridement  Measurements of Total Wound Length: (cm) 6.5 Width: (cm) 4.7 Depth: (cm) 0.2 Volume: (cm) 4.799 Character of Wound/Ulcer Post Debridement: Stable Post Procedure Diagnosis Same as Pre-procedure Electronic Signature(s) Signed: 07/21/2021 10:06:48 AM By: Kalman Shan DO Signed: 07/21/2021 4:27:43 PM By: Lorrin Jackson Entered By: Lorrin Jackson on 07/21/2021 09:35:00 -------------------------------------------------------------------------------- HPI Details Patient Name: Date of Service: Melissa Randolph C. 07/21/2021 8:45 A M Medical Record Number: [id number] Patient Account Number: [id number] Date of Birth/Sex: Treating RN: [birth year] (87 y.o. [name] Primary Care Provider: Antony Contras Other Clinician: Referring Provider: Treating Provider/Extender: Olivia Mackie in Treatment: 4 History of Present Illness HPI Description: Admission 06/22/2021 Melissa Randolph is an 87 year old female with a past medical history of A-fib on Eliquis that presents to the clinic for a wound to her right lower extremity. She fell over a wooden baseboard on 4/13. She went to the ED for evaluation and she had 15 stitches placed. She had an x-ray of her leg that showed no acute osseous abnormality. She has been placing Xeroform over the wound bed intermittently. She visited the ED again on 4/21 because of wound infection. She was started on doxycycline and cefadroxil. She states she has a couple days remaining. She currently denies systemic signs of infection. 5/8; patient presents for follow-up. She has been using bacitracin and Xeroform to the wound bed. She reports significant improvement in her pain to the wound bed. She states she is using her compression stockings daily. She denies signs of infection. 5/15; patient presents for follow-up. She has been using Iodoflex to the wound bed. She has no issues or complaints today. She has been using her compression  stocking daily. She went to vein and vascular to have her ABIs with TBI's completed. On the right she had an ABI of 1.17 and TBI of 0.68. 5/22; patient presents for follow-up. We switched to [facility] and antibiotic ointment under a compression wrap at last clinic visit. She tolerated the wrap well. She has no issues or complaints today. She denies  signs of infection. 5/30; patient presents for follow-up. We have been using Hydrofera Blue and antibiotic under compression. The Hydrofera Blue was stuck on tightly and had moved out of place under the wrap. She currently denies signs of infection. She is tolerating the compression wrap well. Electronic Signature(s) Signed: 07/21/2021 10:06:48 AM By: Kalman Shan DO Entered By: Kalman Shan on 07/21/2021 10:03:33 -------------------------------------------------------------------------------- Physical Exam Details Patient Name: Date of Service: [name] 07/21/2021 8:45 A M Medical Record Number: [id number] Patient Account Number: [id number] Date of Birth/Sex: Treating RN: 05-21-1934 (87 y.o. [name] Primary Care Provider: Antony Contras Other Clinician: Referring Provider: Treating Provider/Extender: Olivia Mackie in Treatment: 4 Constitutional respirations regular, non-labored and within target range for patient.. Cardiovascular 2+ dorsalis pedis/posterior tibialis pulses. Psychiatric pleasant and cooperative. Notes Right lower extremity: Large open wound to the anterior aspect with nonviable tissue and granulation tissue present. No surrounding signs of infection. Good edema control. Electronic Signature(s) Signed: 07/21/2021 10:06:48 AM By: Kalman Shan DO Entered By: Kalman Shan on 07/21/2021 10:03:58 -------------------------------------------------------------------------------- Physician Orders Details Patient Name: Date of Service: [name] 07/21/2021  8:45 A M Medical Record Number: [id number] Patient Account Number: [id number] Date of Birth/Sex: Treating RN: 1934/07/07 (87 y.o. 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 - Dr. Heber [place] and [facility] Room # 7 07/28/21 8:45am Return A Bathing/ Shower/ Hygiene May shower with protection but do not get wound dressing(s) wet. - purchase cast protector from [facility], [facility], [employer] Edema Control - Lymphedema / SCD / Other Elevate legs to the level of the heart or above for 30 minutes daily and/or when sitting, a frequency of: - 2-3 times per day void standing for long periods of time. - limit time standing still, sit when possible, wear compression stockings during day A Exercise regularly - light walking Wound Treatment Wound #1 - Lower Leg Wound Laterality: Right, Anterior Cleanser: Soap and Water 1 x Per Week/30 Days Discharge Instructions: May shower and wash wound with dial antibacterial soap and water prior to dressing change. Cleanser: Byram Ancillary Kit - 15 Day Supply (Generic) 1 x Per Week/30 Days Discharge Instructions: Use supplies as instructed; Kit contains: (15) Saline Bullets; (15) 3x3 Gauze; 15 pr Gloves Peri-Wound Care: Sween Lotion (Moisturizing lotion) 1 x Per Week/30 Days Discharge Instructions: Apply moisturizing lotion as directed Prim Dressing: Fibracol Plus Dressing, 4x4.38 in  (collagen) 1 x Per Week/30 Days ary Discharge Instructions: Moisten collagen with hydrogel Prim Dressing: Santyl Ointment 1 x Per Week/30 Days ary Discharge Instructions: Apply nickel thick amount to wound bed as instructed Secondary Dressing: ABD Pad, 8x10 1 x Per Week/30 Days Discharge Instructions: Apply over primary dressing as directed. Compression Wrap: ThreePress (3 layer compression wrap) 1 x Per Week/30 Days Discharge Instructions: Apply three layer compression as directed. Electronic Signature(s) Signed: 07/21/2021 10:06:48 AM By: Kalman Shan DO Entered By: Kalman Shan on 07/21/2021 10:04:05 -------------------------------------------------------------------------------- Problem List Details Patient Name: Date of Service: Melissa Randolph C. 07/21/2021 8:45 A M Medical Record Number: [id number] Patient  Account Number: [id number] Date of Birth/Sex: Treating RN: 12-Jan-1935 (87 y.o. [name] Primary Care Provider: Antony Contras Other Clinician: Referring Provider: Treating Provider/Extender: Olivia Mackie in Treatment: 4 Active Problems ICD-10 Encounter Code Description Active Date MDM Diagnosis [phone] Non-pressure chronic ulcer of unspecified part of right lower leg with fat layer 06/22/2021 No Yes exposed T79.8XXA Other early complications of trauma, initial encounter 06/22/2021 No Yes I48.20 Chronic atrial fibrillation, unspecified 06/22/2021 No Yes Z79.01 Long term (current) use of anticoagulants 06/22/2021 No Yes I10 Essential (primary) hypertension 06/22/2021 No Yes I87.2 Venous insufficiency (chronic) (peripheral) 07/06/2021 No Yes Inactive Problems Resolved Problems Electronic Signature(s) Signed: 07/21/2021 10:06:48 AM By: Kalman Shan DO Entered By: Kalman Shan on 07/21/2021 10:02:29 -------------------------------------------------------------------------------- Progress Note Details Patient Name: Date of  Service: Melissa Randolph C. 07/21/2021 8:45 A M Medical Record Number: [id number] Patient Account Number: [id number] Date of Birth/Sex: Treating RN: 25-Dec-1934 (87 y.o. [name] Primary Care Provider: Antony Contras Other Clinician: Referring Provider: Treating Provider/Extender: Olivia Mackie in Treatment: 4 Subjective Chief Complaint Information obtained from Patient 06/22/2021; Right lower extremity wound following trauma History of Present Illness (HPI) Admission 06/22/2021 Melissa Randolph is an 87 year old female with a past medical history of A-fib on Eliquis that presents to the clinic for a wound to her right lower extremity. She fell over a wooden baseboard on 4/13. She went to the ED for evaluation and she had 15 stitches placed. She had an x-ray of her leg that showed no acute osseous abnormality. She has been placing Xeroform over the wound bed intermittently. She visited the ED again on 4/21 because of wound infection. She was started on doxycycline and cefadroxil. She states she has a couple days remaining. She currently denies systemic signs of infection. 5/8; patient presents for follow-up. She has been using bacitracin and Xeroform to the wound bed. She reports significant improvement in her pain to the wound bed. She states she is using her compression stockings daily. She denies signs of infection. 5/15; patient presents for follow-up. She has been using Iodoflex to the wound bed. She has no issues or complaints today. She has been using her compression stocking daily. She went to vein and vascular to have her ABIs with TBI's completed. On the right she had an ABI of 1.17 and TBI of 0.68. 5/22; patient presents for follow-up. We switched to [facility] and antibiotic ointment under a compression wrap at last clinic visit. She tolerated the wrap well. She has no issues or complaints today. She denies signs of infection. 5/30; patient  presents for follow-up. We have been using Hydrofera Blue and antibiotic under compression. The Hydrofera Blue was stuck on tightly and had moved out of place under the wrap. She currently denies signs of infection. She is tolerating the compression wrap well. Patient History Information obtained from Patient. Family History Cancer - Father, Heart Disease - Father, Hypertension - Father, No family history of Diabetes, Kidney Disease, Lung Disease, Seizures, Stroke, Thyroid Problems, Tuberculosis. Social History Never smoker, Marital Status - Married, Alcohol Use - Never, Drug Use - No History, Caffeine Use - Never. Medical History Respiratory Patient has history of Asthma Cardiovascular Patient has history of Arrhythmia - A-fib, Hypertension Psychiatric Denies history of Anorexia/bulimia, Confinement Anxiety Hospitalization/Surgery History - breast surgery 8 years ago. Medical A Surgical History Notes nd Eyes cataract surgery both eyes Cardiovascular hypercholesterolemia Objective Constitutional respirations regular, non-labored and within target range for patient.. Vitals Time  Taken: 9:01 AM, Height: 64 in, Weight: 118 lbs, BMI: 20.3, Temperature: 97.9 F, Pulse: 94 bpm, Respiratory Rate: 17 breaths/min, Blood Pressure: 138/78 mmHg. Cardiovascular 2+ dorsalis pedis/posterior tibialis pulses. Psychiatric pleasant and cooperative. General Notes: Right lower extremity: Large open wound to the anterior aspect with nonviable tissue and granulation tissue present. No surrounding signs of infection. Good edema control. Integumentary (Hair, Skin) Wound #1 status is Open. Original cause of wound was Trauma. The date acquired was: 06/04/2021. The wound has been in treatment 4 weeks. The wound is located on the Right,Anterior Lower Leg. The wound measures 6.5cm length x 4.7cm width x 0.2cm depth; 23.994cm^2 area and 4.799cm^3 volume. There is Fat Layer (Subcutaneous Tissue) exposed. There  is no tunneling or undermining noted. There is a medium amount of serosanguineous drainage noted. The wound margin is distinct with the outline attached to the wound base. There is medium (34-66%) red granulation within the wound bed. There is a medium (34- 66%) amount of necrotic tissue within the wound bed including Eschar and Adherent Slough. 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 No signs of surrounding infection. The wound bed appears dry today. I recommended switching the dressing from Hydrofera Blue to collagen and hydrogel to help address this issue. I also recommended adding Santyl to help with further debridement. We will continue compression therapy. Follow-up in 1 week. Procedures Wound #1 Pre-procedure diagnosis of Wound #1 is a Trauma, Other located on the Right,Anterior Lower Leg . There was a Excisional Skin/Subcutaneous Tissue Debridement with a total area of 30.55 sq cm performed by Kalman Shan, DO. With the following instrument(s): Curette to remove Non-Viable tissue/material. Material removed includes Subcutaneous Tissue and Slough and after achieving pain control using Other (Benzocaine). No specimens were taken. A time out was conducted at 09:27, prior to the start of the procedure. A Minimum amount of bleeding was controlled with Pressure. The procedure was tolerated well. Post Debridement Measurements: 6.5cm length x 4.7cm width x 0.2cm depth; 4.799cm^3 volume. Character of Wound/Ulcer Post Debridement is stable. Post procedure Diagnosis Wound #1: Same as Pre-Procedure Pre-procedure  diagnosis of Wound #1 is a Trauma, Other located on the Right,Anterior Lower Leg . There was a Three Layer Compression Therapy Procedure by Lorrin Jackson, RN. Post procedure Diagnosis Wound #1: Same as Pre-Procedure Plan Follow-up Appointments: Return Appointment in 1 week. - Dr. Heber [place] and [facility] Room # 7 07/28/21 8:45am Bathing/ Shower/ Hygiene: May shower with protection but do not get wound dressing(s) wet. - purchase cast protector from [facility], [facility], [place] Edema Control - Lymphedema / SCD / Other: Elevate legs to the level of the heart or above for 30 minutes daily and/or when sitting, a frequency of: - 2-3 times per day Avoid standing for long periods of time. - limit time standing still, sit when possible, wear compression stockings during day Exercise regularly - light walking WOUND #1: - Lower Leg Wound Laterality: Right, Anterior Cleanser: Soap and Water 1 x Per Week/30 Days Discharge Instructions: May shower and wash wound with dial antibacterial soap and water prior to dressing change. Cleanser: Byram Ancillary Kit - 15 Day Supply (Generic) 1 x Per Week/30 Days Discharge Instructions: Use supplies as instructed; Kit contains: (15) Saline Bullets; (  15) 3x3 Gauze; 15 pr Gloves Peri-Wound Care: Sween Lotion (Moisturizing lotion) 1 x Per Week/30 Days Discharge Instructions: Apply moisturizing lotion as directed Prim Dressing: Fibracol Plus Dressing, 4x4.38 in (collagen) 1 x Per Week/30 Days ary Discharge Instructions: Moisten collagen with hydrogel Prim Dressing: Santyl Ointment 1 x Per Week/30 Days ary Discharge Instructions: Apply nickel thick amount to wound bed as instructed Secondary Dressing: ABD Pad, 8x10 1 x Per Week/30 Days Discharge Instructions: Apply over primary dressing as directed. Com pression Wrap: ThreePress (3 layer compression wrap) 1 x Per Week/30 Days Discharge Instructions: Apply three layer compression as directed. 1. In office sharp debridement 2.  Collagen and hydrogel 3. Santyl 4. 3 layer compression 5. Follow-up in 1 week Electronic Signature(s) Signed: 07/21/2021 10:06:48 AM By: Kalman Shan DO Entered By: Kalman Shan on 07/21/2021 10:05:53 -------------------------------------------------------------------------------- HxROS Details Patient Name: Date of Service: Melissa Randolph C. 07/21/2021 8:45 A M Medical Record Number: [id number] Patient Account Number: [id number] Date of Birth/Sex: Treating RN: 1934-10-16 (87 y.o. [name] Primary Care Provider: Antony Contras Other Clinician: Referring Provider: Treating Provider/Extender: Olivia Mackie in Treatment: 4 Information Obtained From Patient Eyes Medical History: Past Medical History Notes: cataract surgery both eyes Respiratory Medical History: Positive for: Asthma Cardiovascular Medical History: Positive for: Arrhythmia - A-fib; Hypertension Past Medical History Notes: hypercholesterolemia Psychiatric Medical History: Negative for: Anorexia/bulimia; Confinement Anxiety Immunizations Pneumococcal Vaccine: Received Pneumococcal Vaccination: Yes Received Pneumococcal Vaccination On or After 60th Birthday: Yes Implantable Devices None Hospitalization / Surgery History Type of Hospitalization/Surgery breast surgery 8 years ago Family and Social History Cancer: Yes - Father; Diabetes: No; Heart Disease: Yes - Father; Hypertension: Yes - Father; Kidney Disease: No; Lung Disease: No; Seizures: No; Stroke: No; Thyroid Problems: No; Tuberculosis: No; Never smoker; Marital Status - Married; Alcohol Use: Never; Drug Use: No History; Caffeine Use: Never; Financial Concerns: No; Food, Clothing or Shelter Needs: No; Support System Lacking: No; Transportation Concerns: No Electronic Signature(s) Signed: 07/21/2021 10:06:48 AM By: Kalman Shan DO Signed: 07/21/2021 4:27:43 PM By: Lorrin Jackson Entered By: Kalman Shan  on 07/21/2021 10:03:40 -------------------------------------------------------------------------------- SuperBill Details Patient Name: Date of Service: [name] 07/21/2021 Medical Record Number: [id number] Patient Account Number: [id number] Date of Birth/Sex: Treating RN: August 30, 1934 (87 y.o. [name] Primary Care Provider: Antony Contras Other Clinician: Referring Provider: Treating Provider/Extender: Olivia Mackie in Treatment: 4 Diagnosis Coding ICD-10 Codes Code Description [phone] Non-pressure chronic ulcer of unspecified part of right lower leg with fat layer exposed T79.8XXA Other early complications of trauma, initial encounter I48.20 Chronic atrial fibrillation, unspecified Z79.01 Long term (current) use of anticoagulants I10 Essential (primary) hypertension I87.2 Venous insufficiency (chronic) (peripheral) Facility Procedures CPT4 Code: [id number] Description: 31497 - DEB SUBQ TISSUE 20 SQ CM/< ICD-10 Diagnosis Description W26.378 Non-pressure chronic ulcer of unspecified part of right lower leg with fat laye Modifier: r exposed Quantity: 1 CPT4 Code: [id number] Description: 11045 - DEB SUBQ TISS EA ADDL 20CM ICD-10 Diagnosis Description A12.878 Non-pressure chronic ulcer of unspecified part of right lower leg with fat laye Modifier: r exposed Quantity: 1 Physician Procedures : CPT4 Code Description Modifier [id number] 47096 - WC PHYS LEVEL 2 - EST PT 25 ICD-10 Diagnosis Description L97.912 Non-pressure chronic ulcer of unspecified part of right lower leg with fat layer exposed T79.8XXA Other early complications of trauma,  initial encounter I87.2 Venous insufficiency (chronic) (peripheral) I48.20 Chronic atrial fibrillation, unspecified Quantity: 1 : [id number] 11042 - WC PHYS SUBQ TISS 20  SQ CM ICD-10 Diagnosis Description S08.138 Non-pressure chronic ulcer of unspecified part of right lower leg with fat layer exposed Quantity: 1 :  [id number] 11045 - WC PHYS SUBQ TISS EA ADDL 20 CM ICD-10 Diagnosis Description I71.855 Non-pressure chronic ulcer of unspecified part of right lower leg with fat layer exposed Quantity: 1 Electronic Signature(s) Signed: 07/21/2021 12:32:00 PM By: Kalman Shan DO Previous Signature: 07/21/2021 10:06:48 AM Version By: Kalman Shan DO Entered By: Kalman Shan on 07/21/2021 12:24:06

## 2021-07-24 DIAGNOSIS — D0362 Melanoma in situ of left upper limb, including shoulder: Secondary | ICD-10-CM | POA: Diagnosis not present

## 2021-07-24 DIAGNOSIS — L821 Other seborrheic keratosis: Secondary | ICD-10-CM | POA: Diagnosis not present

## 2021-07-24 DIAGNOSIS — D485 Neoplasm of uncertain behavior of skin: Secondary | ICD-10-CM | POA: Diagnosis not present

## 2021-07-24 DIAGNOSIS — L570 Actinic keratosis: Secondary | ICD-10-CM | POA: Diagnosis not present

## 2021-07-24 DIAGNOSIS — L820 Inflamed seborrheic keratosis: Secondary | ICD-10-CM | POA: Diagnosis not present

## 2021-07-24 DIAGNOSIS — Z85828 Personal history of other malignant neoplasm of skin: Secondary | ICD-10-CM | POA: Diagnosis not present

## 2021-07-24 DIAGNOSIS — D225 Melanocytic nevi of trunk: Secondary | ICD-10-CM | POA: Diagnosis not present

## 2021-07-28 ENCOUNTER — Encounter (HOSPITAL_BASED_OUTPATIENT_CLINIC_OR_DEPARTMENT_OTHER): Payer: Medicare Other | Attending: Internal Medicine | Admitting: Internal Medicine

## 2021-07-28 DIAGNOSIS — Z7901 Long term (current) use of anticoagulants: Secondary | ICD-10-CM | POA: Insufficient documentation

## 2021-07-28 DIAGNOSIS — L97912 Non-pressure chronic ulcer of unspecified part of right lower leg with fat layer exposed: Secondary | ICD-10-CM | POA: Diagnosis not present

## 2021-07-28 DIAGNOSIS — I10 Essential (primary) hypertension: Secondary | ICD-10-CM | POA: Insufficient documentation

## 2021-07-28 DIAGNOSIS — L97812 Non-pressure chronic ulcer of other part of right lower leg with fat layer exposed: Secondary | ICD-10-CM | POA: Diagnosis not present

## 2021-07-28 DIAGNOSIS — I872 Venous insufficiency (chronic) (peripheral): Secondary | ICD-10-CM | POA: Diagnosis not present

## 2021-07-28 DIAGNOSIS — X58XXXA Exposure to other specified factors, initial encounter: Secondary | ICD-10-CM | POA: Insufficient documentation

## 2021-07-28 DIAGNOSIS — I482 Chronic atrial fibrillation, unspecified: Secondary | ICD-10-CM | POA: Diagnosis not present

## 2021-07-28 DIAGNOSIS — T798XXA Other early complications of trauma, initial encounter: Secondary | ICD-10-CM | POA: Diagnosis not present

## 2021-07-30 NOTE — Progress Notes (Signed)
[name], [name] ([id number]) Visit Report for 07/28/2021 Debridement Details Patient Name: Date of Service: Melissa Randolph 07/28/2021 8:45 A M Medical Record Number: [id number] Patient Account Number: [id number] Date of Birth/Sex: Treating RN: 12-25-1934 (87 y.o. Melissa Randolph Primary Care Provider: Antony Contras Other Clinician: Referring Provider: Treating Provider/Extender: Randon Goldsmith in Treatment: 5 Debridement Performed for Assessment: Wound #1 Right,Anterior Lower Leg Performed By: Physician Ricard Dillon., MD Debridement Type: Debridement Level of Consciousness (Pre-procedure): Awake and Alert Pre-procedure Verification/Time Out Yes - 09:05 Taken: Start Time: 09:05 Pain Control: Lidocaine T Area Debrided (L x W): otal 4.5 (cm) x 4 (cm) = 18 (cm) Tissue and other material debrided: Viable, Non-Viable, Slough, Subcutaneous, Slough Level: Skin/Subcutaneous Tissue Debridement Description: Excisional Instrument: Curette Bleeding: Minimum Hemostasis Achieved: Pressure End Time: 09:05 Procedural Pain: 0 Post Procedural Pain: 0 Response to Treatment: Procedure was tolerated well Level of Consciousness (Post- Awake and Alert procedure): Post Debridement Measurements of Total Wound Length: (cm) 4.5 Width: (cm) 4 Depth: (cm) 0.2 Volume: (cm) 2.827 Character of Wound/Ulcer Post Debridement: Improved Post Procedure Diagnosis Same as Pre-procedure Electronic Signature(s) Signed: 07/28/2021 5:45:31 PM By: Linton Ham MD Signed: 07/28/2021 6:00:15 PM By: Lorrin Jackson Entered By: Linton Ham on 07/28/2021 09:13:11 -------------------------------------------------------------------------------- HPI Details Patient Name: Date of Service: Melissa Leach C. 07/28/2021 8:45 A M Medical Record Number: [id number] Patient Account Number: [id number] Date of Birth/Sex: Treating RN: November 30, 1934 (87 y.o. Melissa Randolph Primary Care  Provider: Antony Contras Other Clinician: Referring Provider: Treating Provider/Extender: Randon Goldsmith in Treatment: 5 History of Present Illness HPI Description: Admission 06/22/2021 [name] is an 87 year old female with a past medical history of A-fib on Eliquis that presents to the clinic for a wound to her right lower extremity. She fell over a wooden baseboard on 4/13. She went to the ED for evaluation and she had 15 stitches placed. She had an x-ray of her leg that showed no acute osseous abnormality. She has been placing Xeroform over the wound bed intermittently. She visited the ED again on 4/21 because of wound infection. She was started on doxycycline and cefadroxil. She states she has a couple days remaining. She currently denies systemic signs of infection. 5/8; patient presents for follow-up. She has been using bacitracin and Xeroform to the wound bed. She reports significant improvement in her pain to the wound bed. She states she is using her compression stockings daily. She denies signs of infection. 5/15; patient presents for follow-up. She has been using Iodoflex to the wound bed. She has no issues or complaints today. She has been using her compression stocking daily. She went to vein and vascular to have her ABIs with TBI's completed. On the right she had an ABI of 1.17 and TBI of 0.68. 5/22; patient presents for follow-up. We switched to [facility] and antibiotic ointment under a compression wrap at last clinic visit. She tolerated the wrap well. She has no issues or complaints today. She denies signs of infection. 5/30; patient presents for follow-up. We have been using Hydrofera Blue and antibiotic under compression. The Hydrofera Blue was stuck on tightly and had moved out of place under the wrap. She currently denies signs of infection. She is tolerating the compression wrap well. 6/6; patient changed to collagen last week also using  Santyl. Dimensions are a lot better this week. She was also put under compression last week with 3 layer Electronic Signature(s) Signed: 07/28/2021 5:45:31  PM By: Linton Ham MD Entered By: Linton Ham on 07/28/2021 09:15:27 -------------------------------------------------------------------------------- Physical Exam Details Patient Name: Date of Service: Melissa Randolph 07/28/2021 8:45 A M Medical Record Number: [id number] Patient Account Number: [id number] Date of Birth/Sex: Treating RN: Jul 03, 1934 (87 y.o. Melissa Randolph Primary Care Provider: Antony Contras Other Clinician: Referring Provider: Treating Provider/Extender: Randon Goldsmith in Treatment: 5 Constitutional Patient is hypertensive.. Pulse regular and within target range for patient.Marland Kitchen Respirations regular, non-labored and within target range.. Temperature is normal and within the target range for the patient.Marland Kitchen Appears in no distress. Cardiovascular Good edema control in the right leg. Some signs of chronic venous insufficiency. Notes Wound exam; right anterior lower leg. Under illumination improved granulation. In the lower aspects of the wound still some very adherent material. Rims of epithelialization noted Using a #3 curette gentle debridement of the areas that were covered in slough. Subcutaneous reveals healthy granulation underneath. Minimal bleeding Electronic Signature(s) Signed: 07/28/2021 5:45:31 PM By: Linton Ham MD Entered By: Linton Ham on 07/28/2021 09:16:55 -------------------------------------------------------------------------------- Physician Orders Details Patient Name: Date of Service: Melissa Leach C. 07/28/2021 8:45 A M Medical Record Number: [id number] Patient Account Number: [id number] Date of Birth/Sex: Treating RN: July 30, 1934 (87 y.o. Melissa Randolph, Melissa Randolph Primary Care Provider: Antony Contras Other Clinician: Referring Provider: Treating  Provider/Extender: Randon Goldsmith in Treatment: 5 Verbal / Phone Orders: No Diagnosis Coding Follow-up Appointments ppointment in 1 week. - Dr. Heber [place] and [facility] Room # 7 08/04/2021 @ 0800 Return A Bathing/ Shower/ Hygiene May shower with protection but do not get wound dressing(s) wet. - purchase cast protector from [facility], [facility], [employer] Edema Control - Lymphedema / SCD / Other Elevate legs to the level of the heart or above for 30 minutes daily and/or when sitting, a frequency of: - 2-3 times per day void standing for long periods of time. - limit time standing still, sit when possible, wear compression stockings during day A Exercise regularly - light walking Wound Treatment Wound #1 - Lower Leg Wound Laterality: Right, Anterior Cleanser: Soap and Water 1 x Per Week/30 Days Discharge Instructions: May shower and wash wound with dial antibacterial soap and water prior to dressing change. Cleanser: Byram Ancillary Kit - 15 Day Supply (Generic) 1 x Per Week/30 Days Discharge Instructions: Use supplies as instructed; Kit contains: (15) Saline Bullets; (15) 3x3 Gauze; 15 pr Gloves Peri-Wound Care: Sween Lotion (Moisturizing lotion) 1 x Per Week/30 Days Discharge Instructions: Apply moisturizing lotion as directed Prim Dressing: Fibracol Plus Dressing, 4x4.38 in (collagen) 1 x Per Week/30 Days ary Discharge Instructions: Moisten collagen with hydrogel Prim Dressing: Santyl Ointment 1 x Per Week/30 Days ary Discharge Instructions: Apply nickel thick amount to wound bed as instructed Secondary Dressing: ABD Pad, 8x10 1 x Per Week/30 Days Discharge Instructions: Apply over primary dressing as directed. Compression Wrap: ThreePress (3 layer compression wrap) 1 x Per Week/30 Days Discharge Instructions: Apply three layer compression as directed. Electronic Signature(s) Signed: 07/28/2021 5:45:31 PM By: Linton Ham MD Signed: 07/30/2021 4:55:17 PM By: Rhae Hammock  RN Entered By: Rhae Hammock on 07/28/2021 09:10:23 -------------------------------------------------------------------------------- Problem List Details Patient Name: Date of Service: Melissa Leach C. 07/28/2021 8:45 A M Medical Record Number: [id number] Patient Account Number: [id number] Date of Birth/Sex: Treating RN: 1934-12-20 (87 y.o. 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 06/22/2021 No Yes exposed T79.8XXA Other early complications of trauma, initial encounter 06/22/2021 No Yes I48.20 Chronic atrial fibrillation, unspecified 06/22/2021 No Yes Z79.01 Long term (current) use of anticoagulants 06/22/2021 No Yes I10 Essential (primary) hypertension 06/22/2021 No Yes I87.2 Venous insufficiency (chronic) (peripheral) 07/06/2021 No Yes Inactive Problems Resolved Problems Electronic Signature(s) Signed: 07/28/2021 5:45:31 PM By: Linton Ham MD Entered By: Linton Ham on 07/28/2021 09:12:57 -------------------------------------------------------------------------------- Progress Note Details Patient Name: Date of Service: Melissa Leach C. 07/28/2021 8:45 A M Medical Record Number: [id number] Patient Account Number: [id number] Date of Birth/Sex: Treating RN: 22-Jun-1934 (87 y.o. Melissa Randolph Primary Care Provider: Antony Contras Other Clinician: Referring Provider: Treating Provider/Extender: Randon Goldsmith in Treatment: 5 Subjective History of Present Illness (HPI) Admission 06/22/2021 Ms. Juliani Laduke is an 87 year old female with a past medical history of A-fib on Eliquis that presents to the clinic for a wound to her right lower extremity. She  fell over a wooden baseboard on 4/13. She went to the ED for evaluation and she had 15 stitches placed. She had an x-ray of her leg that showed no acute osseous abnormality. She has been placing Xeroform over the wound bed intermittently. She visited the ED again on 4/21 because of wound infection. She was started on doxycycline and cefadroxil. She states she has a couple days remaining. She currently denies systemic signs of infection. 5/8; patient presents for follow-up. She has been using bacitracin and Xeroform to the wound bed. She reports significant improvement in her pain to the wound bed. She states she is using her compression stockings daily. She denies signs of infection. 5/15; patient presents for follow-up. She has been using Iodoflex to the wound bed. She has no issues or complaints today. She has been using her compression stocking daily. She went to vein and vascular to have her ABIs with TBI's completed. On the right she had an ABI of 1.17 and TBI of 0.68. 5/22; patient presents for follow-up. We switched to [facility] and antibiotic ointment under a compression wrap at last clinic visit. She tolerated the wrap well. She has no issues or complaints today. She denies signs of infection. 5/30; patient presents for follow-up. We have been using Hydrofera Blue and antibiotic under compression. The Hydrofera Blue was stuck on tightly and had moved out of place under the wrap. She currently denies signs of infection. She is tolerating the compression wrap well. 6/6; patient changed to collagen last week also using Santyl. Dimensions are a lot better this week. She was also put under compression last week with 3 layer Objective Constitutional Patient is hypertensive.. Pulse regular and within target range for patient.Marland Kitchen Respirations regular, non-labored and within target range.. Temperature is normal and within the target range for the patient.Marland Kitchen Appears in no distress. Vitals Time  Taken: 8:44 AM, Height: 64 in, Weight: 118 lbs, BMI: 20.3, Temperature: 98.7 F, Pulse: 97 bpm, Respiratory Rate: 17 breaths/min, Blood Pressure: 160/70 mmHg. Cardiovascular Good edema control in the right leg. Some signs of chronic venous insufficiency. General Notes: Wound exam; right anterior lower leg. Under illumination improved granulation. In the lower aspects of the wound still some very adherent material. Rims of epithelialization noted Using a #3 curette gentle debridement of the areas that were covered in slough. Subcutaneous reveals healthy granulation underneath. Minimal bleeding Integumentary (Hair, Skin) Wound #1 status is Open. Original cause of wound  was Trauma. The date acquired was: 06/04/2021. The wound has been in treatment 5 weeks. The wound is located on the Right,Anterior Lower Leg. The wound measures 4.5cm length x 4cm width x 0.2cm depth; 14.137cm^2 area and 2.827cm^3 volume. There is Fat Layer (Subcutaneous Tissue) exposed. There is no tunneling or undermining noted. There is a medium amount of serosanguineous drainage noted. The wound margin is distinct with the outline attached to the wound base. There is medium (34-66%) red granulation within the wound bed. There is a medium (34-66%) amount of necrotic tissue within the wound bed including Eschar and Adherent Slough. 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 There was a Excisional Skin/Subcutaneous Tissue Debridement with a total area of 18 sq cm performed by Ricard Dillon., MD. With the following  instrument(s): Curette to remove Viable and Non-Viable tissue/material. Material removed includes Subcutaneous Tissue and Slough and after achieving pain control using Lidocaine. No specimens were taken. A time out was conducted at 09:05, prior to the start of the procedure. A Minimum amount of bleeding was controlled with Pressure. The procedure was tolerated well with a pain level of 0 throughout and a pain level of 0 following the procedure. Post Debridement Measurements: 4.5cm length x 4cm width x 0.2cm depth; 2.827cm^3 volume. Character of Wound/Ulcer Post Debridement is improved. Post procedure Diagnosis Wound #1: Same as Pre-Procedure Pre-procedure diagnosis of Wound #1 is a Trauma, Other located on the Right,Anterior Lower Leg . There was a Three Layer Compression Therapy Procedure by Rhae Hammock, RN. Post procedure Diagnosis Wound #1: Same as Pre-Procedure Plan Follow-up Appointments: Return Appointment in 1 week. - Dr. Heber [place] and [facility] Room # 7 08/04/2021 @ 0800 Bathing/ Shower/ Hygiene: May shower with protection but do not get wound dressing(s) wet. - purchase cast protector from [facility], [facility], [place] Edema Control - Lymphedema / SCD / Other: Elevate legs to the level of the heart or above for 30 minutes daily and/or when sitting, a frequency of: - 2-3 times per day Avoid standing for long periods of time. - limit time standing still, sit when possible, wear compression stockings during day Exercise regularly - light walking WOUND #1: - Lower Leg Wound Laterality: Right, Anterior Cleanser: Soap and Water 1 x Per Week/30 Days Discharge Instructions: May shower and wash wound with dial antibacterial soap and water prior to dressing change. Cleanser: Byram Ancillary Kit - 15 Day Supply (Generic) 1 x Per Week/30 Days Discharge Instructions: Use supplies as instructed; Kit contains: (15) Saline Bullets; (15) 3x3 Gauze; 15 pr Gloves Peri-Wound Care: Sween Lotion (Moisturizing  lotion) 1 x Per Week/30 Days Discharge Instructions: Apply moisturizing lotion as directed Prim Dressing: Fibracol Plus Dressing, 4x4.38 in (collagen) 1 x Per Week/30 Days ary Discharge Instructions: Moisten collagen with hydrogel Prim Dressing: Santyl Ointment 1 x Per Week/30 Days ary Discharge Instructions: Apply nickel thick amount to wound bed as instructed Secondary Dressing: ABD Pad, 8x10 1 x Per Week/30 Days Discharge Instructions: Apply over primary dressing as directed. Com pression Wrap: ThreePress (3 layer compression wrap) 1 x Per Week/30 Days Discharge Instructions: Apply three layer compression as directed. 1. I did not change the primary dressing which is Santyl covered  with fibrin call and hydrogel. Nice improvement in wound dimensions this week. 2. Post debridement the granulation really looks quite good. No evidence of infection #3 ABD and 3 layer compression Electronic Signature(s) Signed: 07/28/2021 5:45:31 PM By: Linton Ham MD Entered By: Linton Ham on 07/28/2021 09:18:16 -------------------------------------------------------------------------------- SuperBill Details Patient Name: Date of Service: Melissa Randolph 07/28/2021 Medical Record Number: [id number] Patient Account Number: [id number] Date of Birth/Sex: Treating RN: 17-Jun-1934 (87 y.o. 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 T79.8XXA Other early complications of trauma, initial encounter I48.20 Chronic atrial fibrillation, unspecified Z79.01 Long term (current) use of anticoagulants I10 Essential (primary) hypertension I87.2 Venous insufficiency (chronic) (peripheral) Facility Procedures CPT4 Code:  [id number] Description: 60630 - DEB SUBQ TISSUE 20 SQ CM/< ICD-10 Diagnosis Description Z60.109 Non-pressure chronic ulcer of unspecified part of right lower leg with fat lay Modifier: er exposed Quantity: 1 Physician Procedures : CPT4 Code Description Modifier [id number] 11042 - WC PHYS SUBQ TISS 20 SQ CM ICD-10 Diagnosis Description U20.254 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

## 2021-07-30 NOTE — Progress Notes (Signed)
[name], [name] ([id number]) Visit Report for 07/28/2021 Arrival Information Details Patient Name: Date of Service: Melissa Randolph 07/28/2021 8:45 A M Medical Record Number: [id number] Patient Account Number: [id number] Date of Birth/Sex: Treating RN: March 15, 1934 (87 y.o. Melissa Randolph, [name] Primary Care Gaetano Romberger: Antony Contras Other Clinician: Referring Advika Mclelland: Treating Aharon Carriere/Extender: Randon Goldsmith in Treatment: 5 Visit Information History Since Last Visit Added or deleted any medications: No Patient Arrived: Ambulatory Any new allergies or adverse reactions: No Arrival Time: 08:42 Had a fall or experienced change in No Accompanied By: self activities of daily living that may affect Transfer Assistance: None risk of falls: Patient Identification Verified: Yes Signs or symptoms of abuse/neglect since last visito No Secondary Verification Process Completed: Yes Hospitalized since last visit: No Patient Requires Transmission-Based Precautions: No Implantable device outside of the clinic excluding No Patient Has Alerts: Yes cellular tissue based products placed in the center Patient Alerts: Patient on Blood Thinner since last visit: Eliquis Has Dressing in Place as Prescribed: Yes Has Compression in Place as Prescribed: Yes Pain Present Now: No Electronic Signature(s) Signed: 07/30/2021 4:55:17 PM By: Rhae Hammock RN Entered By: Rhae Hammock on 07/28/2021 08:44:00 -------------------------------------------------------------------------------- Compression Therapy Details Patient Name: Date of Service: Melissa Leach C. 07/28/2021 8:45 A M Medical Record Number: [id number] Patient Account Number: [id number] Date of Birth/Sex: Treating RN: 11/20/1934 (87 y.o. Melissa Randolph, [name] Primary Care Marlowe Cinquemani: Antony Contras Other Clinician: Referring Delano Frate: Treating [name]/Extender: Randon Goldsmith in Treatment:  5 Compression Therapy Performed for Wound Assessment: Wound #1 Right,Anterior Lower Leg Performed By: Clinician Rhae Hammock, RN Compression Type: Three Layer Post Procedure Diagnosis Same as Pre-procedure Electronic Signature(s) Signed: 07/30/2021 4:55:17 PM By: Rhae Hammock RN Entered By: Rhae Hammock on 07/28/2021 09:02:14 -------------------------------------------------------------------------------- Encounter Discharge Information Details Patient Name: Date of Service: Melissa Leach C. 07/28/2021 8:45 A M Medical Record Number: [id number] Patient Account Number: [id number] Date of Birth/Sex: Treating RN: 03/18/1934 (87 y.o. Melissa Randolph, [name] Primary Care Laxmi Choung: Antony Contras Other Clinician: Referring [name]: Treating [name]/Extender: Randon Goldsmith in Treatment: 5 Encounter Discharge Information Items Post Procedure Vitals Discharge Condition: Stable Temperature (F): 98.7 Ambulatory Status: Ambulatory Pulse (bpm): 74 Discharge Destination: Home Respiratory Rate (breaths/min): 17 Transportation: Private Auto Blood Pressure (mmHg): 134/74 Accompanied By: self Schedule Follow-up Appointment: Yes Clinical Summary of Care: Patient Declined Electronic Signature(s) Signed: 07/30/2021 4:55:17 PM By: Rhae Hammock RN Entered By: Rhae Hammock on 07/28/2021 09:35:04 -------------------------------------------------------------------------------- Lower Extremity Assessment Details Patient Name: Date of Service: Melissa Leach C. 07/28/2021 8:45 A M Medical Record Number: [id number] Patient Account Number: [id number] Date of Birth/Sex: Treating RN: 1934/03/08 (87 y.o. Melissa Randolph, [name] Primary Care Ashli Selders: Antony Contras Other Clinician: Referring Kenzey Birkland: Treating Kemani Heidel/Extender: Randon Goldsmith in Treatment: 5 Edema Assessment Assessed: [name]: No] [name]: No] Edema: [Left: Ye] [Right:  s] Calf Left: Right: Point of Measurement: 28 cm From Medial Instep 31.5 cm Ankle Left: Right: Point of Measurement: 10 cm From Medial Instep 20.4 cm Vascular Assessment Pulses: Dorsalis Pedis Palpable: [Right:Yes] Posterior Tibial Palpable: [Right:Yes] Electronic Signature(s) Signed: 07/30/2021 4:55:17 PM By: Rhae Hammock RN Entered By: Rhae Hammock on 07/28/2021 08:47:59 -------------------------------------------------------------------------------- Multi Wound Chart Details Patient Name: Date of Service: Melissa Leach C. 07/28/2021 8:45 A M Medical Record Number: [id number] Patient Account Number: [id number] Date of Birth/Sex: Treating RN: 08/05/1934 (87 y.o. Melissa Randolph Primary Care Daivd Fredericksen: Antony Contras Other Clinician: Referring Andry Bogden: Treating Maximiano Lott/Extender: Dellia Nims  Riley Kill, Bobbye Riggs in Treatment: 5 Vital Signs Height(in): 64 Pulse(bpm): 97 Weight(lbs): 118 Blood Pressure(mmHg): 160/70 Body Mass Index(BMI): 20.3 Temperature(F): 98.7 Respiratory Rate(breaths/min): 17 Photos: [N/A:N/A] Right, Anterior Lower Leg N/A N/A Wound Location: Trauma N/A N/A Wounding Event: Trauma, Other N/A N/A Primary Etiology: Asthma, Arrhythmia, Hypertension N/A N/A Comorbid History: 06/04/2021 N/A N/A Date Acquired: 5 N/A N/A Weeks of Treatment: Open N/A N/A Wound Status: No N/A N/A Wound Recurrence: 4.5x4x0.2 N/A N/A Measurements L x W x D (cm) 14.137 N/A N/A A (cm) : rea 2.827 N/A N/A Volume (cm) : 85.40% N/A N/A % Reduction in A rea: 70.70% N/A N/A % Reduction in Volume: Full Thickness Without Exposed N/A N/A Classification: Support Structures Medium N/A N/A Exudate A mount: Serosanguineous N/A N/A Exudate Type: red, brown N/A N/A Exudate Color: Distinct, outline attached N/A N/A Wound Margin: Medium (34-66%) N/A N/A Granulation A mount: Red N/A N/A Granulation Quality: Medium (34-66%) N/A N/A Necrotic A  mount: Eschar, Adherent Slough N/A N/A Necrotic Tissue: Fat Layer (Subcutaneous Tissue): Yes N/A N/A Exposed Structures: Fascia: No Tendon: No Muscle: No Joint: No Bone: No Medium (34-66%) N/A N/A Epithelialization: Debridement - Excisional N/A N/A Debridement: Pre-procedure Verification/Time Out 09:05 N/A N/A Taken: Lidocaine N/A N/A Pain Control: Subcutaneous, Slough N/A N/A Tissue Debrided: Skin/Subcutaneous Tissue N/A N/A Level: 18 N/A N/A Debridement A (sq cm): rea Curette N/A N/A Instrument: Minimum N/A N/A Bleeding: Pressure N/A N/A Hemostasis A chieved: 0 N/A N/A Procedural Pain: 0 N/A N/A Post Procedural Pain: Procedure was tolerated well N/A N/A Debridement Treatment Response: 4.5x4x0.2 N/A N/A Post Debridement Measurements L x W x D (cm) 2.827 N/A N/A Post Debridement Volume: (cm) Compression Therapy N/A N/A Procedures Performed: Debridement Treatment Notes Electronic Signature(s) Signed: 07/28/2021 5:45:31 PM By: Linton Ham MD Signed: 07/28/2021 6:00:15 PM By: Lorrin Jackson Entered By: Linton Ham on 07/28/2021 09:13:03 -------------------------------------------------------------------------------- Multi-Disciplinary Care Plan Details Patient Name: Date of Service: [name] 07/28/2021 8:45 A M Medical Record Number: [id number] Patient Account Number: [id number] Date of Birth/Sex: Treating RN: [birth year] (87 y.o. Melissa Randolph, [name] Primary Care Jerusalem Wert: Antony Contras Other Clinician: Referring Safa Derner: Treating Abid Bolla/Extender: Randon Goldsmith in Treatment: 5 Active Inactive Abuse / Safety / Falls / Self Care Management Nursing Diagnoses: History of Falls Potential for injury related to falls Goals: Patient will not experience any injury related to falls Date Initiated: 06/22/2021 Target Resolution Date: 08/22/2021 Goal Status: Active Patient will remain injury free related to falls Date  Initiated: 06/22/2021 Target Resolution Date: 08/22/2021 Goal Status: Active Patient/caregiver will verbalize understanding of skin care regimen Date Initiated: 06/22/2021 Date Inactivated: 07/06/2021 Target Resolution Date: 07/25/2021 Goal Status: Met Patient/caregiver will verbalize/demonstrate measures taken to prevent injury and/or falls Date Initiated: 06/22/2021 Target Resolution Date: 08/22/2021 Goal Status: Active Interventions: Assess fall risk on admission and as needed Assess self care needs on admission and as needed Provide education on basic hygiene Provide education on fall prevention Treatment Activities: Education provided on Basic Hygiene : 07/06/2021 Notes: Wound/Skin Impairment Nursing Diagnoses: Impaired tissue integrity Goals: Patient/caregiver will verbalize understanding of skin care regimen Date Initiated: 06/22/2021 Date Inactivated: 07/06/2021 Target Resolution Date: 07/25/2021 Goal Status: Met Ulcer/skin breakdown will have a volume reduction of 30% by week 4 Date Initiated: 06/22/2021 Target Resolution Date: 08/22/2021 Goal Status: Active Interventions: Assess ulceration(s) every visit Provide education on ulcer and skin care Notes: Electronic Signature(s) Signed: 07/30/2021 4:55:17 PM By: Rhae Hammock RN Entered By: Rhae Hammock on 07/28/2021 09:04:42 --------------------------------------------------------------------------------  Pain Assessment Details Patient Name: Date of Service: Melissa Randolph 07/28/2021 8:45 A M Medical Record Number: [id number] Patient Account Number: [id number] Date of Birth/Sex: Treating RN: Mar 23, 1934 (87 y.o. Melissa Randolph, [name] Primary Care Amalia Edgecombe: Antony Contras Other Clinician: Referring Jamare Vanatta: Treating Dea Bitting/Extender: Randon Goldsmith in Treatment: 5 Active Problems Location of Pain Severity and Description of Pain Patient Has Paino No Site Locations Pain Management and  Medication Current Pain Management: Electronic Signature(s) Signed: 07/30/2021 4:55:17 PM By: Rhae Hammock RN Entered By: Rhae Hammock on 07/28/2021 08:47:50 -------------------------------------------------------------------------------- Patient/Caregiver Education Details Patient Name: Date of Service: Melissa Randolph 6/6/2023andnbsp8:45 National Record Number: [id number] Patient Account Number: [id number] Date of Birth/Gender: Treating RN: 06/01/1934 (87 y.o. Melissa Randolph, [name] Primary Care Physician: Antony Contras Other Clinician: Referring Physician: Treating Physician/Extender: Randon Goldsmith in Treatment: 5 Education Assessment Education Provided To: Patient Education Topics Provided Wound/Skin Impairment: Methods: Explain/Verbal Responses: Reinforcements needed, State content correctly Electronic Signature(s) Signed: 07/30/2021 4:55:17 PM By: Rhae Hammock RN Entered By: Rhae Hammock on 07/28/2021 09:04:53 -------------------------------------------------------------------------------- Wound Assessment Details Patient Name: Date of Service: Melissa Leach C. 07/28/2021 8:45 A M Medical Record Number: [id number] Patient Account Number: [id number] Date of Birth/Sex: Treating RN: December 18, 1934 (87 y.o. Melissa Randolph, [name] Primary Care Kalijah Zeiss: Antony Contras Other Clinician: Referring Benjiman Sedgwick: Treating Alwyn Cordner/Extender: Randon Goldsmith in Treatment: 5 Wound Status Wound Number: 1 Primary Etiology: Trauma, Other Wound Location: Right, Anterior Lower Leg Wound Status: Open Wounding Event: Trauma Comorbid History: Asthma, Arrhythmia, Hypertension Date Acquired: 06/04/2021 Weeks Of Treatment: 5 Clustered Wound: No Photos Wound Measurements Length: (cm) 4.5 Width: (cm) 4 Depth: (cm) 0.2 Area: (cm) 14.137 Volume: (cm) 2.827 % Reduction in Area: 85.4% % Reduction in Volume:  70.7% Epithelialization: Medium (34-66%) Tunneling: No Undermining: No Wound Description Classification: Full Thickness Without Exposed Support Structures Wound Margin: Distinct, outline attached Exudate Amount: Medium Exudate Type: Serosanguineous Exudate Color: red, brown Foul Odor After Cleansing: No Slough/Fibrino Yes Wound Bed Granulation Amount: Medium (34-66%) Exposed Structure Granulation Quality: Red Fascia Exposed: No Necrotic Amount: Medium (34-66%) Fat Layer (Subcutaneous Tissue) Exposed: Yes Necrotic Quality: Eschar, Adherent Slough Tendon Exposed: No Muscle Exposed: No Joint Exposed: No Bone Exposed: No Treatment Notes Wound #1 (Lower Leg) Wound Laterality: Right, Anterior Cleanser Soap and Water Discharge Instruction: May shower and wash wound with dial antibacterial soap and water prior to dressing change. [employer] Ancillary Kit - 15 Day Supply Discharge Instruction: Use supplies as instructed; Kit contains: (15) Saline Bullets; (15) 3x3 Gauze; 15 pr Gloves Peri-Wound Care Sween Lotion (Moisturizing lotion) Discharge Instruction: Apply moisturizing lotion as directed Topical Primary Dressing Fibracol Plus Dressing, 4x4.38 in (collagen) Discharge Instruction: Moisten collagen with hydrogel Santyl Ointment Discharge Instruction: Apply nickel thick amount to wound bed as instructed Secondary Dressing ABD Pad, 8x10 Discharge Instruction: Apply over primary dressing as directed. Secured With Compression Wrap ThreePress (3 layer compression wrap) Discharge Instruction: Apply three layer compression as directed. Compression Stockings Add-Ons Electronic Signature(s) Signed: 07/28/2021 6:06:53 PM By: Deon Pilling RN, BSN Signed: 07/30/2021 4:55:17 PM By: Rhae Hammock RN Entered By: Deon Pilling on 07/28/2021 08:52:14 -------------------------------------------------------------------------------- Vitals Details Patient Name: Date of Service: Melissa Leach C. 07/28/2021 8:45 A M Medical Record Number: [id number] Patient Account Number: [id number] Date of Birth/Sex: Treating RN: [birth year] (87 y.o. Melissa Randolph, [name] Primary Care Markis Langland: Antony Contras Other Clinician: Referring Karmin Kasprzak: Treating Sarrinah Gardin/Extender: Randon Goldsmith in Treatment: 5 Vital Signs  Time Taken: 08:44 Temperature (F): 98.7 Height (in): 64 Pulse (bpm): 97 Weight (lbs): 118 Respiratory Rate (breaths/min): 17 Body Mass Index (BMI): 20.3 Blood Pressure (mmHg): 160/70 Reference Range: 80 - 120 mg / dl Electronic Signature(s) Signed: 07/30/2021 4:55:17 PM By: Rhae Hammock RN Signed: 07/30/2021 4:55:17 PM By: Rhae Hammock RN Entered By: Rhae Hammock on 07/28/2021 08:44:15

## 2021-08-04 ENCOUNTER — Encounter (HOSPITAL_BASED_OUTPATIENT_CLINIC_OR_DEPARTMENT_OTHER): Payer: Medicare Other | Admitting: Internal Medicine

## 2021-08-04 DIAGNOSIS — L97912 Non-pressure chronic ulcer of unspecified part of right lower leg with fat layer exposed: Secondary | ICD-10-CM

## 2021-08-04 DIAGNOSIS — I482 Chronic atrial fibrillation, unspecified: Secondary | ICD-10-CM | POA: Diagnosis not present

## 2021-08-04 DIAGNOSIS — I10 Essential (primary) hypertension: Secondary | ICD-10-CM | POA: Diagnosis not present

## 2021-08-04 DIAGNOSIS — T798XXA Other early complications of trauma, initial encounter: Secondary | ICD-10-CM | POA: Diagnosis not present

## 2021-08-04 DIAGNOSIS — I872 Venous insufficiency (chronic) (peripheral): Secondary | ICD-10-CM | POA: Diagnosis not present

## 2021-08-04 DIAGNOSIS — Z7901 Long term (current) use of anticoagulants: Secondary | ICD-10-CM | POA: Diagnosis not present

## 2021-08-04 NOTE — Progress Notes (Signed)
Melissa Randolph, Melissa Randolph ([id number]) Visit Report for 08/04/2021 Chief Complaint Document Details Patient Name: Date of Service: Melissa Randolph 08/04/2021 8:00 A M Medical Record Number: [id number] Patient Account Number: [id number] Date of Birth/Sex: Treating RN: [birth year] (87 y.o. [name] Primary Care Provider: Antony Contras Other Clinician: Referring Provider: Treating Provider/Extender: Olivia Mackie in Treatment: 6 Information Obtained from: Patient Chief Complaint 06/22/2021; Right lower extremity wound following trauma Electronic Signature(s) Signed: 08/04/2021 9:48:03 AM By: Kalman Shan DO Entered By: Kalman Shan on 08/04/2021 08:35:09 -------------------------------------------------------------------------------- Debridement Details Patient Name: Date of Service: Melissa Randolph [name] 08/04/2021 8:00 A M Medical Record Number: [id number] Patient Account Number: [id number] Date of Birth/Sex: Treating RN: 07-01-1934 (87 y.o. [name] Primary Care Provider: Antony Contras Other Clinician: Referring Provider: Treating Provider/Extender: Olivia Mackie in Treatment: 6 Debridement Performed for Assessment: Wound #1 Right,Anterior Lower Leg Performed By: Physician Kalman Shan, DO Debridement Type: Debridement Level of Consciousness (Pre-procedure): Awake and Alert Pre-procedure Verification/Time Out Yes - 08:22 Taken: Start Time: 08:23 Pain Control: Other : Benzocaine T Area Debrided (L x W): otal 4 (cm) x 3.5 (cm) = 14 (cm) Tissue and other material debrided: Non-Viable, Slough, Subcutaneous, Slough Level: Skin/Subcutaneous Tissue Debridement Description: Excisional Instrument: Curette Bleeding: Minimum Hemostasis Achieved: Pressure End Time: 08:28 Response to Treatment: Procedure was tolerated well Level of Consciousness (Post- Awake and Alert procedure): Post Debridement Measurements of  Total Wound Length: (cm) 4 Width: (cm) 3.5 Depth: (cm) 0.2 Volume: (cm) 2.199 Character of Wound/Ulcer Post Debridement: Stable Post Procedure Diagnosis Same as Pre-procedure Electronic Signature(s) Signed: 08/04/2021 9:48:03 AM By: Kalman Shan DO Signed: 08/04/2021 5:10:04 PM By: Lorrin Jackson Entered By: Lorrin Jackson on 08/04/2021 08:29:45 -------------------------------------------------------------------------------- HPI Details Patient Name: Date of Service: Melissa Randolph [name] 08/04/2021 8:00 A M Medical Record Number: [id number] Patient Account Number: [id number] Date of Birth/Sex: Treating RN: [birth year] (87 y.o. [name] Primary Care Provider: Antony Contras Other Clinician: Referring Provider: Treating Provider/Extender: Olivia Mackie in Treatment: 6 History of Present Illness HPI Description: Admission 06/22/2021 [name] is an 87 year old female with a past medical history of A-fib on Eliquis that presents to the clinic for a wound to her right lower extremity. She fell over a wooden baseboard on 4/13. She went to the ED for evaluation and she had 15 stitches placed. She had an x-ray of her leg that showed no acute osseous abnormality. She has been placing Xeroform over the wound bed intermittently. She visited the ED again on 4/21 because of wound infection. She was started on doxycycline and cefadroxil. She states she has a couple days remaining. She currently denies systemic signs of infection. 5/8; patient presents for follow-up. She has been using bacitracin and Xeroform to the wound bed. She reports significant improvement in her pain to the wound bed. She states she is using her compression stockings daily. She denies signs of infection. 5/15; patient presents for follow-up. She has been using Iodoflex to the wound bed. She has no issues or complaints today. She has been using her compression stocking daily. She went  to vein and vascular to have her ABIs with TBI's completed. On the right she had an ABI of 1.17 and TBI of 0.68. 5/22; patient presents for follow-up. We switched to [facility] and antibiotic ointment under a compression wrap at last clinic visit. She tolerated the wrap well. She has no issues or complaints today. She denies  signs of infection. 5/30; patient presents for follow-up. We have been using Hydrofera Blue and antibiotic under compression. The Hydrofera Blue was stuck on tightly and had moved out of place under the wrap. She currently denies signs of infection. She is tolerating the compression wrap well. 6/6; patient changed to collagen last week also using Santyl. Dimensions are a lot better this week. She was also put under compression last week with 3 layer 6/13; patient presents for follow-up. We have been using collagen and Santyl under 3 layer compression. She continues to tolerate this well. She has no issues or complaints today. She denies signs of infection. Electronic Signature(s) Signed: 08/04/2021 9:48:03 AM By: Kalman Shan DO Entered By: Kalman Shan on 08/04/2021 08:35:35 -------------------------------------------------------------------------------- Physical Exam Details Patient Name: Date of Service: Melissa Randolph [name] 08/04/2021 8:00 A M Medical Record Number: [id number] Patient Account Number: [id number] Date of Birth/Sex: Treating RN: 20-Oct-1934 (87 y.o. [name] Primary Care Provider: Antony Contras Other Clinician: Referring Provider: Treating Provider/Extender: Olivia Mackie in Treatment: 6 Constitutional respirations regular, non-labored and within target range for patient.. Cardiovascular 2+ dorsalis pedis/posterior tibialis pulses. Psychiatric pleasant and cooperative. Notes Right lower extremity: Open wound to the anterior aspect with nonviable tissue and granulation tissue present. No surrounding signs of  infection. Good edema control. Electronic Signature(s) Signed: 08/04/2021 9:48:03 AM By: Kalman Shan DO Entered By: Kalman Shan on 08/04/2021 08:36:21 -------------------------------------------------------------------------------- Physician Orders Details Patient Name: Date of Service: Melissa Randolph [name] 08/04/2021 8:00 A M Medical Record Number: [id number] Patient Account Number: [id number] Date of Birth/Sex: Treating RN: 06/15/1934 (87 y.o. 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 - Dr. Heber [place] and Leveda Anna (Room # 7) 08/10/2021 @ 0800 Return A Bathing/ Shower/ Hygiene May shower with protection but do not get wound dressing(s) wet. - purchase cast protector from [facility], [facility], [employer] Edema Control - Lymphedema / SCD / Other Elevate legs to the level of the heart or above for 30 minutes daily and/or when sitting, a frequency of: - 2-3 times per day void standing for long periods of time. - limit time standing still, sit when possible, wear compression stockings during day A Exercise regularly - light walking Wound Treatment Wound #1 - Lower Leg Wound Laterality: Right, Anterior Cleanser: Soap and Water 1 x Per Week/30 Days Discharge Instructions: May shower and wash wound with dial antibacterial soap and water prior to dressing change. Cleanser: Byram Ancillary Kit - 15 Day  Supply (Generic) 1 x Per Week/30 Days Discharge Instructions: Use supplies as instructed; Kit contains: (15) Saline Bullets; (15) 3x3 Gauze; 15 pr Gloves Peri-Wound Care: Sween Lotion (Moisturizing lotion) 1 x Per Week/30 Days Discharge Instructions: Apply moisturizing lotion as directed Prim Dressing: Fibracol Plus Dressing, 4x4.38 in (collagen) ary 1 x Per Week/30 Days Prim Dressing: Santyl Ointment 1 x Per Week/30 Days ary Discharge Instructions: Apply nickel thick amount to wound bed as instructed Secondary Dressing: ABD Pad, 8x10 1 x Per Week/30 Days Discharge Instructions: Apply over primary dressing as directed. Compression Wrap: ThreePress (3  layer compression wrap) 1 x Per Week/30 Days Discharge Instructions: Apply three layer compression as directed. Electronic Signature(s) Signed: 08/04/2021 9:48:03 AM By: Kalman Shan DO Entered By: Kalman Shan on 08/04/2021 08:36:29 -------------------------------------------------------------------------------- Problem List Details Patient Name: Date of Service: Melissa Randolph [name] 08/04/2021 8:00 A M Medical Record Number: [id number] Patient Account Number: [id number] Date of Birth/Sex: Treating RN: [birth year] (87 y.o. [name] Primary Care Provider: Antony Contras Other Clinician: Referring Provider: Treating Provider/Extender: Olivia Mackie in Treatment: 6 Active Problems ICD-10 Encounter Code Description Active Date MDM Diagnosis [phone] Non-pressure chronic ulcer of unspecified part of right lower leg with fat layer 06/22/2021 No Yes exposed T79.8XXA Other early complications of trauma, initial encounter 06/22/2021 No Yes I48.20 Chronic atrial fibrillation, unspecified 06/22/2021 No Yes Z79.01 Long term (current) use of anticoagulants 06/22/2021 No Yes I10 Essential (primary) hypertension 06/22/2021 No Yes I87.2 Venous insufficiency (chronic) (peripheral) 07/06/2021 No Yes Inactive  Problems Resolved Problems Electronic Signature(s) Signed: 08/04/2021 9:48:03 AM By: Kalman Shan DO Entered By: Kalman Shan on 08/04/2021 08:34:44 -------------------------------------------------------------------------------- Progress Note Details Patient Name: Date of Service: Melissa Randolph [name] 08/04/2021 8:00 A M Medical Record Number: [id number] Patient Account Number: [id number] Date of Birth/Sex: Treating RN: [birth year] (87 y.o. [name] Primary Care Provider: Antony Contras Other Clinician: Referring Provider: Treating Provider/Extender: Olivia Mackie in Treatment: 6 Subjective Chief Complaint Information obtained from Patient 06/22/2021; Right lower extremity wound following trauma History of Present Illness (HPI) Admission 06/22/2021 Ms. Senai Ramnath is an 87 year old female with a past medical history of A-fib on Eliquis that presents to the clinic for a wound to her right lower extremity. She fell over a wooden baseboard on 4/13. She went to the ED for evaluation and she had 15 stitches placed. She had an x-ray of her leg that showed no acute osseous abnormality. She has been placing Xeroform over the wound bed intermittently. She visited the ED again on 4/21 because of wound infection. She was started on doxycycline and cefadroxil. She states she has a couple days remaining. She currently denies systemic signs of infection. 5/8; patient presents for follow-up. She has been using bacitracin and Xeroform to the wound bed. She reports significant improvement in her pain to the wound bed. She states she is using her compression stockings daily. She denies signs of infection. 5/15; patient presents for follow-up. She has been using Iodoflex to the wound bed. She has no issues or complaints today. She has been using her compression stocking daily. She went to vein and vascular to have her ABIs with TBI's completed. On the right she had  an ABI of 1.17 and TBI of 0.68. 5/22; patient presents for follow-up. We switched to [facility] and antibiotic ointment under a compression wrap at last clinic visit. She tolerated the wrap well. She has no issues or complaints today. She denies signs of infection. 5/30; patient presents for follow-up. We have been using Hydrofera Blue and antibiotic under compression. The Hydrofera Blue was stuck on tightly and had moved out of place under the wrap. She currently denies signs of infection. She is tolerating the compression wrap well. 6/6; patient changed to collagen last week also using Santyl. Dimensions are a lot better this week. She was also put under compression last week with 3 layer 6/13; patient presents for follow-up. We have been using collagen and Santyl under 3 layer compression. She continues to tolerate this well. She has no issues or  complaints today. She denies signs of infection. Patient History Information obtained from Patient. Family History Cancer - Father, Heart Disease - Father, Hypertension - Father, No family history of Diabetes, Kidney Disease, Lung Disease, Seizures, Stroke, Thyroid Problems, Tuberculosis. Social History Never smoker, Marital Status - Married, Alcohol Use - Never, Drug Use - No History, Caffeine Use - Never. Medical History Respiratory Patient has history of Asthma Cardiovascular Patient has history of Arrhythmia - A-fib, Hypertension Psychiatric Denies history of Anorexia/bulimia, Confinement Anxiety Hospitalization/Surgery History - breast surgery 8 years ago. Medical A Surgical History Notes nd Eyes cataract surgery both eyes Cardiovascular hypercholesterolemia Objective Constitutional respirations regular, non-labored and within target range for patient.. Vitals Time Taken: 8:04 AM, Height: 64 in, Weight: 118 lbs, BMI: 20.3, Temperature: 97.7 F, Pulse: 87 bpm, Respiratory Rate: 18 breaths/min, Blood Pressure: 139/73  mmHg. Cardiovascular 2+ dorsalis pedis/posterior tibialis pulses. Psychiatric pleasant and cooperative. General Notes: Right lower extremity: Open wound to the anterior aspect with nonviable tissue and granulation tissue present. No surrounding signs of infection. Good edema control. Integumentary (Hair, Skin) Wound #1 status is Open. Original cause of wound was Trauma. The date acquired was: 06/04/2021. The wound has been in treatment 6 weeks. The wound is located on the Right,Anterior Lower Leg. The wound measures 4cm length x 3.5cm width x 0.2cm depth; 10.996cm^2 area and 2.199cm^3 volume. There is Fat Layer (Subcutaneous Tissue) exposed. There is no tunneling or undermining noted. There is a medium amount of serosanguineous drainage noted. The wound margin is distinct with the outline attached to the wound base. There is medium (34-66%) red granulation within the wound bed. There is a medium (34-66%) amount of necrotic tissue within the wound bed including Eschar and Adherent Slough. 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 I debrided nonviable tissue. I recommended continuing the course with collagen and Santyl under 3 layer compression. Follow-up in 1 week. Procedures Wound #1 Pre-procedure diagnosis of Wound #1 is a Trauma, Other located on the Right,Anterior Lower Leg . There was a Excisional Skin/Subcutaneous Tissue Debridement with a total area of 14 sq cm performed by Kalman Shan, DO. With the following instrument(s): Curette to remove Non-Viable tissue/material. Material removed  includes Subcutaneous Tissue and Slough and after achieving pain control using Other (Benzocaine). No specimens were taken. A time out was conducted at 08:22, prior to the start of the procedure. A Minimum amount of bleeding was controlled with Pressure. The procedure was tolerated well. Post Debridement Measurements: 4cm length x 3.5cm width x 0.2cm depth; 2.199cm^3 volume. Character of Wound/Ulcer Post Debridement is stable. Post procedure Diagnosis Wound #1: Same as Pre-Procedure Pre-procedure diagnosis of Wound #1 is a Trauma, Other located on the Right,Anterior Lower Leg . There was a Three Layer Compression Therapy Procedure by Lorrin Jackson, RN. Post procedure Diagnosis Wound #1: Same as Pre-Procedure Plan Follow-up Appointments: Return Appointment in 1 week. - Dr. Heber  and Leveda Anna (Room # 7) 08/10/2021 @ 0800 Bathing/ Shower/ Hygiene: May shower with protection but do not get wound dressing(s) wet. - purchase cast protector from [facility], [facility], [place] Edema Control - Lymphedema / SCD / Other: Elevate legs to the level of the heart or above for 30 minutes daily and/or when sitting,  a frequency of: - 2-3 times per day Avoid standing for long periods of time. - limit time standing still, sit when possible, wear compression stockings during day Exercise regularly - light walking WOUND #1: - Lower Leg Wound Laterality: Right, Anterior Cleanser: Soap and Water 1 x Per Week/30 Days Discharge Instructions: May shower and wash wound with dial antibacterial soap and water prior to dressing change. Cleanser: Byram Ancillary Kit - 15 Day Supply (Generic) 1 x Per Week/30 Days Discharge Instructions: Use supplies as instructed; Kit contains: (15) Saline Bullets; (15) 3x3 Gauze; 15 pr Gloves Peri-Wound Care: Sween Lotion (Moisturizing lotion) 1 x Per Week/30 Days Discharge Instructions: Apply moisturizing lotion as directed Prim Dressing: Fibracol Plus Dressing, 4x4.38 in (collagen) 1 x Per Week/30  Days ary Prim Dressing: Santyl Ointment 1 x Per Week/30 Days ary Discharge Instructions: Apply nickel thick amount to wound bed as instructed Secondary Dressing: ABD Pad, 8x10 1 x Per Week/30 Days Discharge Instructions: Apply over primary dressing as directed. Com pression Wrap: ThreePress (3 layer compression wrap) 1 x Per Week/30 Days Discharge Instructions: Apply three layer compression as directed. 1. In office sharp debridement 2. Santyl and collagen under 3 layer compression 3. Follow-up in 1 week Electronic Signature(s) Signed: 08/04/2021 9:48:03 AM By: Kalman Shan DO Entered By: Kalman Shan on 08/04/2021 08:37:09 -------------------------------------------------------------------------------- HxROS Details Patient Name: Date of Service: Melissa Randolph [name] 08/04/2021 8:00 A M Medical Record Number: [id number] Patient Account Number: [id number] Date of Birth/Sex: Treating RN: January 28, 1935 (87 y.o. [name] Primary Care Provider: Antony Contras Other Clinician: Referring Provider: Treating Provider/Extender: Olivia Mackie in Treatment: 6 Information Obtained From Patient Eyes Medical History: Past Medical History Notes: cataract surgery both eyes Respiratory Medical History: Positive for: Asthma Cardiovascular Medical History: Positive for: Arrhythmia - A-fib; Hypertension Past Medical History Notes: hypercholesterolemia Psychiatric Medical History: Negative for: Anorexia/bulimia; Confinement Anxiety Immunizations Pneumococcal Vaccine: Received Pneumococcal Vaccination: Yes Received Pneumococcal Vaccination On or After 60th Birthday: Yes Implantable Devices None Hospitalization / Surgery History Type of Hospitalization/Surgery breast surgery 8 years ago Family and Social History Cancer: Yes - Father; Diabetes: No; Heart Disease: Yes - Father; Hypertension: Yes - Father; Kidney Disease: No; Lung Disease: No;  Seizures: No; Stroke: No; Thyroid Problems: No; Tuberculosis: No; Never smoker; Marital Status - Married; Alcohol Use: Never; Drug Use: No History; Caffeine Use: Never; Financial Concerns: No; Food, Clothing or Shelter Needs: No; Support System Lacking: No; Transportation Concerns: No Electronic Signature(s) Signed: 08/04/2021 9:48:03 AM By: Kalman Shan DO Signed: 08/04/2021 5:10:04 PM By: Lorrin Jackson Entered By: Kalman Shan on 08/04/2021 08:35:39 -------------------------------------------------------------------------------- [facility] Details Patient Name: Date of Service: Melissa Randolph [name] 08/04/2021 Medical Record Number: [id number] Patient Account Number: [id number] Date of Birth/Sex: Treating RN: 1935/01/05 (87 y.o. 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 T79.8XXA Other early complications of trauma, initial encounter I48.20 Chronic atrial fibrillation, unspecified Z79.01 Long term (current) use of anticoagulants I10 Essential (primary) hypertension I87.2 Venous insufficiency (chronic) (peripheral) Facility Procedures CPT4 Code: [id number] Description: 19622 - DEB SUBQ TISSUE 20 SQ CM/< ICD-10 Diagnosis Description L97.912 Non-pressure chronic ulcer of unspecified part of right lower leg with fat lay Modifier: er exposed Quantity: 1 Physician Procedures : CPT4 Code Description Modifier [id number] - WC PHYS SUBQ TISS 20  SQ CM ICD-10 Diagnosis Description O82.417 Non-pressure chronic ulcer of unspecified part of right lower leg with fat layer exposed Quantity: 1 Electronic Signature(s) Signed: 08/04/2021 9:48:03 AM By: Kalman Shan DO Entered By: Kalman Shan on 08/04/2021  08:37:17

## 2021-08-04 NOTE — Progress Notes (Signed)
DASIE, CHANCELLOR ([id number]) Visit Report for 08/04/2021 Arrival Information Details Patient Name: Date of Service: Melissa Randolph 08/04/2021 8:00 A M Medical Record Number: [id number] Patient Account Number: [id number] Date of Birth/Sex: Treating RN: 1934-06-02 (87 y.o. Melissa Randolph Primary Care Melissa Randolph: Antony Contras Other Clinician: Referring Wolf Boulay: Treating Melissa Randolph/Extender: Melissa Randolph in Treatment: 6 Visit Information History Since Last Visit Added or deleted any medications: No Patient Arrived: Ambulatory Any new allergies or adverse reactions: No Arrival Time: 08:01 Had a fall or experienced change in No Transfer Assistance: None activities of daily living that may affect Patient Identification Verified: Yes risk of falls: Secondary Verification Process Completed: Yes Signs or symptoms of abuse/neglect since last visito No Patient Requires Transmission-Based Precautions: No Hospitalized since last visit: No Patient Has Alerts: Yes Implantable device outside of the clinic excluding No Patient Alerts: Patient on Blood Thinner cellular tissue based products placed in the center Eliquis since last visit: Has Dressing in Place as Prescribed: Yes Has Compression in Place as Prescribed: Yes Pain Present Now: No Electronic Signature(s) Signed: 08/04/2021 5:10:04 PM By: Melissa Randolph Entered By: Melissa Randolph on 08/04/2021 08:04:56 -------------------------------------------------------------------------------- Compression Therapy Details Patient Name: Date of Service: Melissa Bryant [name] 08/04/2021 8:00 A M Medical Record Number: [id number] Patient Account Number: [id number] Date of Birth/Sex: Treating RN: 07/12/1934 (87 y.o. Melissa Randolph Primary Care Melissa Randolph: Antony Contras Other Clinician: Referring Melissa Randolph: Treating Melissa Randolph/Extender: Melissa Randolph in Treatment: 6 Compression Therapy  Performed for Wound Assessment: Wound #1 Right,Anterior Lower Leg Performed By: Clinician Melissa Jackson, RN Compression Type: Three Layer Post Procedure Diagnosis Same as Pre-procedure Electronic Signature(s) Signed: 08/04/2021 5:10:04 PM By: Melissa Randolph Entered By: Melissa Randolph on 08/04/2021 08:27:38 -------------------------------------------------------------------------------- Encounter Discharge Information Details Patient Name: Date of Service: Melissa Bryant [name] 08/04/2021 8:00 A M Medical Record Number: [id number] Patient Account Number: [id number] Date of Birth/Sex: Treating RN: 1934/12/07 (87 y.o. Melissa Randolph Primary Care Issabela Randolph: Antony Contras Other Clinician: Referring Melissa Randolph: Treating Melissa Randolph/Extender: Melissa Randolph in Treatment: 6 Encounter Discharge Information Items Post Procedure Vitals Discharge Condition: Stable Temperature (F): 97.7 Ambulatory Status: Ambulatory Pulse (bpm): 87 Discharge Destination: Home Respiratory Rate (breaths/min): 18 Transportation: Private Auto Blood Pressure (mmHg): 139/73 Schedule Follow-up Appointment: Yes Clinical Summary of Care: Provided on 08/04/2021 Form Type Recipient Paper Patient Patient Electronic Signature(s) Signed: 08/04/2021 5:10:04 PM By: Melissa Randolph Entered By: Melissa Randolph on 08/04/2021 08:50:16 -------------------------------------------------------------------------------- Lower Extremity Assessment Details Patient Name: Date of Service: Melissa Bryant [name] 08/04/2021 8:00 A M Medical Record Number: [id number] Patient Account Number: [id number] Date of Birth/Sex: Treating RN: February 04, 1935 (87 y.o. Melissa Randolph Primary Care Nimrit Kehres: Antony Contras Other Clinician: Referring Melissa Randolph: Treating Melissa Randolph/Extender: Melissa Randolph in Treatment: 6 Edema Assessment Assessed: [name]: No] [name]: Yes] Edema: [Left: Ye] [Right: s] Calf Left:  Right: Point of Measurement: 28 cm From Medial Instep 30.8 cm Ankle Left: Right: Point of Measurement: 10 cm From Medial Instep 20 cm Vascular Assessment Pulses: Dorsalis Pedis Palpable: [Right:Yes] Electronic Signature(s) Signed: 08/04/2021 5:10:04 PM By: Melissa Randolph Entered By: Melissa Randolph on 08/04/2021 08:09:24 -------------------------------------------------------------------------------- Multi Wound Chart Details Patient Name: Date of Service: Melissa Bryant [name] 08/04/2021 8:00 A M Medical Record Number: [id number] Patient Account Number: [id number] Date of Birth/Sex: Treating RN: [birth year] (87 y.o. Melissa Randolph Primary Care Melissa Randolph: Antony Contras Other Clinician: Referring Melissa Randolph: Treating Melissa Randolph/Extender: Melissa Randolph in Treatment: 6  Vital Signs Height(in): 64 Pulse(bpm): 87 Weight(lbs): 118 Blood Pressure(mmHg): 139/73 Body Mass Index(BMI): 20.3 Temperature(F): 97.7 Respiratory Rate(breaths/min): 18 Photos: [N/A:N/A] Right, Anterior Lower Leg N/A N/A Wound Location: Trauma N/A N/A Wounding Event: Trauma, Other N/A N/A Primary Etiology: Asthma, Arrhythmia, Hypertension N/A N/A Comorbid History: 06/04/2021 N/A N/A Date Acquired: 6 N/A N/A Weeks of Treatment: Open N/A N/A Wound Status: No N/A N/A Wound Recurrence: 4x3.5x0.2 N/A N/A Measurements L x W x D (cm) 10.996 N/A N/A A (cm) : rea 2.199 N/A N/A Volume (cm) : 88.60% N/A N/A % Reduction in A rea: 77.20% N/A N/A % Reduction in Volume: Full Thickness Without Exposed N/A N/A Classification: Support Structures Medium N/A N/A Exudate A mount: Serosanguineous N/A N/A Exudate Type: red, brown N/A N/A Exudate Color: Distinct, outline attached N/A N/A Wound Margin: Medium (34-66%) N/A N/A Granulation A mount: Red N/A N/A Granulation Quality: Medium (34-66%) N/A N/A Necrotic A mount: Eschar, Adherent Slough N/A N/A Necrotic Tissue: Fat Layer  (Subcutaneous Tissue): Yes N/A N/A Exposed Structures: Fascia: No Tendon: No Muscle: No Joint: No Bone: No Medium (34-66%) N/A N/A Epithelialization: Debridement - Excisional N/A N/A Debridement: Pre-procedure Verification/Time Out 08:22 N/A N/A Taken: Other N/A N/A Pain Control: Subcutaneous, Slough N/A N/A Tissue Debrided: Skin/Subcutaneous Tissue N/A N/A Level: 14 N/A N/A Debridement A (sq cm): rea Curette N/A N/A Instrument: Minimum N/A N/A Bleeding: Pressure N/A N/A Hemostasis A chieved: Procedure was tolerated well N/A N/A Debridement Treatment Response: 4x3.5x0.2 N/A N/A Post Debridement Measurements L x W x D (cm) 2.199 N/A N/A Post Debridement Volume: (cm) Slight maceration N/A N/A Assessment Notes: Compression Therapy N/A N/A Procedures Performed: Debridement Treatment Notes Electronic Signature(s) Signed: 08/04/2021 9:48:03 AM By: Kalman Shan DO Signed: 08/04/2021 5:10:04 PM By: Melissa Randolph Entered By: Kalman Shan on 08/04/2021 08:34:50 -------------------------------------------------------------------------------- Multi-Disciplinary Care Plan Details Patient Name: Date of Service: Melissa Bryant [name] 08/04/2021 8:00 A M Medical Record Number: [id number] Patient Account Number: [id number] Date of Birth/Sex: Treating RN: 09-13-1934 (87 y.o. Melissa Randolph Primary Care Jeremi Losito: Antony Contras Other Clinician: Referring Kealie Barrie: Treating Siniyah Evangelist/Extender: Melissa Randolph in Treatment: 6 Active Inactive Abuse / Safety / Falls / Self Care Management Nursing Diagnoses: History of Falls Potential for injury related to falls Goals: Patient will not experience any injury related to falls Date Initiated: 06/22/2021 Target Resolution Date: 08/22/2021 Goal Status: Active Patient will remain injury free related to falls Date Initiated: 06/22/2021 Target Resolution Date: 08/22/2021 Goal Status:  Active Patient/caregiver will verbalize understanding of skin care regimen Date Initiated: 06/22/2021 Date Inactivated: 07/06/2021 Target Resolution Date: 07/25/2021 Goal Status: Met Patient/caregiver will verbalize/demonstrate measures taken to prevent injury and/or falls Date Initiated: 06/22/2021 Target Resolution Date: 08/22/2021 Goal Status: Active Interventions: Assess fall risk on admission and as needed Assess self care needs on admission and as needed Provide education on basic hygiene Provide education on fall prevention Treatment Activities: Education provided on Basic Hygiene : 06/22/2021 Notes: Wound/Skin Impairment Nursing Diagnoses: Impaired tissue integrity Goals: Patient/caregiver will verbalize understanding of skin care regimen Date Initiated: 06/22/2021 Date Inactivated: 07/06/2021 Target Resolution Date: 07/25/2021 Goal Status: Met Ulcer/skin breakdown will have a volume reduction of 30% by week 4 Date Initiated: 06/22/2021 Target Resolution Date: 08/22/2021 Goal Status: Active Interventions: Assess ulceration(s) every visit Provide education on ulcer and skin care Notes: Electronic Signature(s) Signed: 08/04/2021 5:10:04 PM By: Melissa Randolph Entered By: Melissa Randolph on 08/04/2021 08:01:25 -------------------------------------------------------------------------------- Pain Assessment Details Patient Name: Date of Service: [name], Melissa Randolph  08/04/2021 8:00 A M Medical Record Number: [id number] Patient Account Number: [id number] Date of Birth/Sex: Treating RN: 21-Feb-1935 (87 y.o. Melissa Randolph Primary Care Donyale Berthold: Antony Contras Other Clinician: Referring Ronell Duffus: Treating Briyanna Billingham/Extender: Melissa Randolph in Treatment: 6 Active Problems Location of Pain Severity and Description of Pain Patient Has Paino No Site Locations Pain Management and Medication Current Pain Management: Electronic Signature(s) Signed: 08/04/2021 5:10:04  PM By: Melissa Randolph Entered By: Melissa Randolph on 08/04/2021 08:09:09 -------------------------------------------------------------------------------- Patient/Caregiver Education Details Patient Name: Date of Service: Melissa Randolph 6/13/2023andnbsp8:00 [facility] Record Number: [id number] Patient Account Number: [id number] Date of Birth/Gender: Treating RN: 06-12-1934 (87 y.o. Melissa Randolph Primary Care Physician: Antony Contras Other Clinician: Referring Physician: Treating Physician/Extender: Melissa Randolph in Treatment: 6 Education Assessment Education Provided To: Patient Education Topics Provided Venous: Methods: Explain/Verbal, Printed Responses: State content correctly Wound/Skin Impairment: Methods: Explain/Verbal, Printed Responses: State content correctly Electronic Signature(s) Signed: 08/04/2021 5:10:04 PM By: Melissa Randolph Entered By: Melissa Randolph on 08/04/2021 08:01:46 -------------------------------------------------------------------------------- Wound Assessment Details Patient Name: Date of Service: Melissa Bryant [name] 08/04/2021 8:00 A M Medical Record Number: [id number] Patient Account Number: [id number] Date of Birth/Sex: Treating RN: Jan 20, 1935 (87 y.o. Melissa Randolph Primary Care Fatim Vanderschaaf: Antony Contras Other Clinician: Referring Maloni Musleh: Treating Tichina Koebel/Extender: Melissa Randolph in Treatment: 6 Wound Status Wound Number: 1 Primary Etiology: Trauma, Other Wound Location: Right, Anterior Lower Leg Wound Status: Open Wounding Event: Trauma Comorbid History: Asthma, Arrhythmia, Hypertension Date Acquired: 06/04/2021 Weeks Of Treatment: 6 Clustered Wound: No Photos Wound Measurements Length: (cm) 4 Width: (cm) 3.5 Depth: (cm) 0.2 Area: (cm) 10.996 Volume: (cm) 2.199 % Reduction in Area: 88.6% % Reduction in Volume: 77.2% Epithelialization: Medium (34-66%) Tunneling:  No Undermining: No Wound Description Classification: Full Thickness Without Exposed Support Structures Wound Margin: Distinct, outline attached Exudate Amount: Medium Exudate Type: Serosanguineous Exudate Color: red, brown Foul Odor After Cleansing: No Slough/Fibrino Yes Wound Bed Granulation Amount: Medium (34-66%) Exposed Structure Granulation Quality: Red Fascia Exposed: No Necrotic Amount: Medium (34-66%) Fat Layer (Subcutaneous Tissue) Exposed: Yes Necrotic Quality: Eschar, Adherent Slough Tendon Exposed: No Muscle Exposed: No Joint Exposed: No Bone Exposed: No Assessment Notes Slight maceration Treatment Notes Wound #1 (Lower Leg) Wound Laterality: Right, Anterior Cleanser Soap and Water Discharge Instruction: May shower and wash wound with dial antibacterial soap and water prior to dressing change. [facility] Ancillary Kit - 15 Day Supply Discharge Instruction: Use supplies as instructed; Kit contains: (15) Saline Bullets; (15) 3x3 Gauze; 15 pr Gloves Peri-Wound Care Sween Lotion (Moisturizing lotion) Discharge Instruction: Apply moisturizing lotion as directed Topical Primary Dressing Fibracol Plus Dressing, 4x4.38 in (collagen) Santyl Ointment Discharge Instruction: Apply nickel thick amount to wound bed as instructed Secondary Dressing ABD Pad, 8x10 Discharge Instruction: Apply over primary dressing as directed. Secured With Compression Wrap ThreePress (3 layer compression wrap) Discharge Instruction: Apply three layer compression as directed. Compression Stockings Add-Ons Electronic Signature(s) Signed: 08/04/2021 5:10:04 PM By: Melissa Randolph Entered By: Melissa Randolph on 08/04/2021 08:12:23 -------------------------------------------------------------------------------- Vitals Details Patient Name: Date of Service: Melissa Bryant [name] 08/04/2021 8:00 A M Medical Record Number: [id number] Patient Account Number: [id number] Date of Birth/Sex: Treating  RN: [birth year] (87 y.o. Melissa Randolph Primary Care Teale Goodgame: Antony Contras Other Clinician: Referring Perkins Molina: Treating Tia Gelb/Extender: Melissa Randolph in Treatment: 6 Vital Signs Time Taken: 08:04 Temperature (F): 97.7 Height (in): 64 Pulse (bpm): 87 Weight (lbs): 118 Respiratory Rate (breaths/min): 18 Body  Mass Index (BMI): 20.3 Blood Pressure (mmHg): 139/73 Reference Range: 80 - 120 mg / dl Electronic Signature(s) Signed: 08/04/2021 5:10:04 PM By: Melissa Randolph Entered By: Melissa Randolph on 08/04/2021 08:05:14

## 2021-08-04 NOTE — Progress Notes (Signed)
MAURITA, HAVENER ([id number]) Visit Report for 07/21/2021 Arrival Information Details Patient Name: Date of Service: Melissa Randolph 07/21/2021 8:45 A M Medical Record Number: [id number] Patient Account Number: [id number] Date of Birth/Sex: Treating RN: 1935-01-24 (87 y.o. [name] Primary Care Micco Bourbeau: Antony Contras Other Clinician: Referring Lenya Sterne: Treating Kaleem Sartwell/Extender: Olivia Mackie in Treatment: 4 Visit Information History Since Last Visit Added or deleted any medications: No Patient Arrived: Ambulatory Any new allergies or adverse reactions: No Arrival Time: 08:50 Had a fall or experienced change in No Accompanied By: self activities of daily living that may affect Transfer Assistance: None risk of falls: Patient Identification Verified: Yes Signs or symptoms of abuse/neglect since last visito No Secondary Verification Process Completed: Yes Hospitalized since last visit: No Patient Requires Transmission-Based Precautions: No Implantable device outside of the clinic excluding No Patient Has Alerts: Yes cellular tissue based products placed in the center Patient Alerts: Patient on Blood Thinner since last visit: Eliquis Has Dressing in Place as Prescribed: Yes Pain Present Now: No Electronic Signature(s) Signed: 08/04/2021 8:46:54 AM By: Erenest Blank Entered By: Erenest Blank on 07/21/2021 08:51:29 -------------------------------------------------------------------------------- Compression Therapy Details Patient Name: Date of Service: Melissa Leach C. 07/21/2021 8:45 A M Medical Record Number: [id number] Patient Account Number: [id number] Date of Birth/Sex: Treating RN: May 31, 1934 (87 y.o. [name] Primary Care Ercie Eliasen: Antony Contras Other Clinician: Referring Keanon Bevins: Treating Rigley Niess/Extender: Olivia Mackie in Treatment: 4 Compression Therapy Performed for Wound Assessment:  Wound #1 Right,Anterior Lower Leg Performed By: Clinician Lorrin Jackson, RN Compression Type: Three Layer Post Procedure Diagnosis Same as Pre-procedure Electronic Signature(s) Signed: 07/21/2021 4:27:43 PM By: Lorrin Jackson Entered By: Lorrin Jackson on 07/21/2021 09:35:14 -------------------------------------------------------------------------------- Encounter Discharge Information Details Patient Name: Date of Service: Melissa Randolph 07/21/2021 8:45 A M Medical Record Number: [id number] Patient Account Number: [id number] Date of Birth/Sex: Treating RN: [birth year] (87 y.o. [name] Primary Care Newton Frutiger: Antony Contras Other Clinician: Referring Brandley Aldrete: Treating Sherre Wooton/Extender: Olivia Mackie in Treatment: 4 Encounter Discharge Information Items Post Procedure Vitals Discharge Condition: Stable Temperature (F): 97.9 Ambulatory Status: Ambulatory Pulse (bpm): 94 Discharge Destination: Home Respiratory Rate (breaths/min): 17 Transportation: Private Auto Blood Pressure (mmHg): 138/78 Schedule Follow-up Appointment: Yes Clinical Summary of Care: Provided on 07/21/2021 Form Type Recipient Paper Patient Patient Electronic Signature(s) Signed: 07/21/2021 4:27:43 PM By: Lorrin Jackson Entered By: Lorrin Jackson on 07/21/2021 09:41:02 -------------------------------------------------------------------------------- Lower Extremity Assessment Details Patient Name: Date of Service: Melissa Randolph 07/21/2021 8:45 A M Medical Record Number: [id number] Patient Account Number: [id number] Date of Birth/Sex: Treating RN: [birth year] (87 y.o. [name] Primary Care Federica Allport: Antony Contras Other Clinician: Referring Heide Brossart: Treating Kewanna Kasprzak/Extender: Olivia Mackie in Treatment: 4 Edema Assessment Assessed: [name]: No] [name]: Yes] Edema: [Left: Ye] [Right: s] Calf Left: Right: Point of Measurement:  28 cm From Medial Instep 33 cm Ankle Left: Right: Point of Measurement: 10 cm From Medial Instep 20.4 cm Vascular Assessment Pulses: Dorsalis Pedis Palpable: [Right:Yes] Electronic Signature(s) Signed: 07/21/2021 4:27:43 PM By: Lorrin Jackson Entered By: Lorrin Jackson on 07/21/2021 09:09:35 -------------------------------------------------------------------------------- Multi Wound Chart Details Patient Name: Date of Service: Melissa Leach C. 07/21/2021 8:45 A M Medical Record Number: [id number] Patient Account Number: [id number] Date of Birth/Sex: Treating RN: Jun 11, 1934 (87 y.o. [name] Primary Care Tyese Finken: Antony Contras Other Clinician: Referring Hannahgrace Lalli: Treating Raydon Chappuis/Extender: Olivia Mackie in Treatment: 4 Vital Signs Height(in): 46  Pulse(bpm): 94 Weight(lbs): 118 Blood Pressure(mmHg): 138/78 Body Mass Index(BMI): 20.3 Temperature(F): 97.9 Respiratory Rate(breaths/min): 17 Photos: [N/A:N/A] Right, Anterior Lower Leg N/A N/A Wound Location: Trauma N/A N/A Wounding Event: Trauma, Other N/A N/A Primary Etiology: Asthma, Arrhythmia, Hypertension N/A N/A Comorbid History: 06/04/2021 N/A N/A Date Acquired: 4 N/A N/A Weeks of Treatment: Open N/A N/A Wound Status: No N/A N/A Wound Recurrence: 6.5x4.7x0.2 N/A N/A Measurements L x W x D (cm) 23.994 N/A N/A A (cm) : rea 4.799 N/A N/A Volume (cm) : 75.20% N/A N/A % Reduction in A rea: 50.30% N/A N/A % Reduction in Volume: Full Thickness Without Exposed N/A N/A Classification: Support Structures Medium N/A N/A Exudate A mount: Serosanguineous N/A N/A Exudate Type: red, brown N/A N/A Exudate Color: Distinct, outline attached N/A N/A Wound Margin: Medium (34-66%) N/A N/A Granulation A mount: Red N/A N/A Granulation Quality: Medium (34-66%) N/A N/A Necrotic A mount: Eschar, Adherent Slough N/A N/A Necrotic Tissue: Fat Layer (Subcutaneous Tissue): Yes  N/A N/A Exposed Structures: Fascia: No Tendon: No Muscle: No Joint: No Bone: No Medium (34-66%) N/A N/A Epithelialization: Debridement - Excisional N/A N/A Debridement: Pre-procedure Verification/Time Out 09:27 N/A N/A Taken: Other N/A N/A Pain Control: Subcutaneous, Slough N/A N/A Tissue Debrided: Skin/Subcutaneous Tissue N/A N/A Level: 30.55 N/A N/A Debridement A (sq cm): rea Curette N/A N/A Instrument: Minimum N/A N/A Bleeding: Pressure N/A N/A Hemostasis A chieved: Procedure was tolerated well N/A N/A Debridement Treatment Response: 6.5x4.7x0.2 N/A N/A Post Debridement Measurements L x W x D (cm) 4.799 N/A N/A Post Debridement Volume: (cm) Compression Therapy N/A N/A Procedures Performed: Debridement Treatment Notes Wound #1 (Lower Leg) Wound Laterality: Right, Anterior Cleanser Soap and Water Discharge Instruction: May shower and wash wound with dial antibacterial soap and water prior to dressing change. [place] Ancillary Kit - 15 Day Supply Discharge Instruction: Use supplies as instructed; Kit contains: (15) Saline Bullets; (15) 3x3 Gauze; 15 pr Gloves Peri-Wound Care Sween Lotion (Moisturizing lotion) Discharge Instruction: Apply moisturizing lotion as directed Topical Primary Dressing Fibracol Plus Dressing, 4x4.38 in (collagen) Discharge Instruction: Moisten collagen with hydrogel Santyl Ointment Discharge Instruction: Apply nickel thick amount to wound bed as instructed Secondary Dressing ABD Pad, 8x10 Discharge Instruction: Apply over primary dressing as directed. Secured With Compression Wrap ThreePress (3 layer compression wrap) Discharge Instruction: Apply three layer compression as directed. Compression Stockings Add-Ons Electronic Signature(s) Signed: 07/21/2021 10:06:48 AM By: Kalman Shan DO Signed: 07/21/2021 4:27:43 PM By: Fara Chute By: Kalman Shan on 07/21/2021  10:02:34 -------------------------------------------------------------------------------- [place] Details Patient Name: Date of Service: Melissa Randolph 07/21/2021 8:45 A M Medical Record Number: [id number] Patient Account Number: [id number] Date of Birth/Sex: Treating RN: 12-13-1934 (87 y.o. [name] Primary Care Althea Backs: Antony Contras Other Clinician: Referring Claira Jeter: Treating Natali Lavallee/Extender: Olivia Mackie in Treatment: 4 Active Inactive Abuse / Safety / Falls / Self Care Management Nursing Diagnoses: History of Falls Potential for injury related to falls Goals: Patient will not experience any injury related to falls Date Initiated: 06/22/2021 Target Resolution Date: 07/25/2021 Goal Status: Active Patient will remain injury free related to falls Date Initiated: 06/22/2021 Target Resolution Date: 07/25/2021 Goal Status: Active Patient/caregiver will verbalize understanding of skin care regimen Date Initiated: 06/22/2021 Date Inactivated: 07/06/2021 Target Resolution Date: 07/25/2021 Goal Status: Met Patient/caregiver will verbalize/demonstrate measures taken to prevent injury and/or falls Date Initiated: 06/22/2021 Target Resolution Date: 07/25/2021 Goal Status: Active Interventions: Assess fall risk on admission and as needed Assess self care needs on admission  and as needed Provide education on basic hygiene Provide education on fall prevention Treatment Activities: Education provided on Basic Hygiene : 06/22/2021 Notes: Wound/Skin Impairment Nursing Diagnoses: Impaired tissue integrity Goals: Patient/caregiver will verbalize understanding of skin care regimen Date Initiated: 06/22/2021 Date Inactivated: 07/06/2021 Target Resolution Date: 07/25/2021 Goal Status: Met Ulcer/skin breakdown will have a volume reduction of 30% by week 4 Date Initiated: 06/22/2021 Target Resolution Date: 07/25/2021 Goal Status:  Active Interventions: Assess ulceration(s) every visit Provide education on ulcer and skin care Notes: Electronic Signature(s) Signed: 07/21/2021 4:27:43 PM By: Lorrin Jackson Entered By: Lorrin Jackson on 07/21/2021 09:07:56 -------------------------------------------------------------------------------- Pain Assessment Details Patient Name: Date of Service: Melissa Randolph 07/21/2021 8:45 A M Medical Record Number: [id number] Patient Account Number: [id number] Date of Birth/Sex: Treating RN: [birth year] (87 y.o. [name] Primary Care Elbert Polyakov: Antony Contras Other Clinician: Referring Tennis Mckinnon: Treating Jermale Crass/Extender: Olivia Mackie in Treatment: 4 Active Problems Location of Pain Severity and Description of Pain Patient Has Paino No Site Locations Pain Management and Medication Current Pain Management: Electronic Signature(s) Signed: 07/21/2021 4:27:43 PM By: Lorrin Jackson Signed: 08/04/2021 8:46:54 AM By: Erenest Blank Entered By: Erenest Blank on 07/21/2021 08:51:40 -------------------------------------------------------------------------------- Patient/Caregiver Education Details Patient Name: Date of Service: Melissa Randolph 5/30/2023andnbsp8:45 A M Medical Record Number: [id number] Patient Account Number: [id number] Date of Birth/Gender: Treating RN: [birth year] (87 y.o. [name] Primary Care Physician: Antony Contras Other Clinician: Referring Physician: Treating Physician/Extender: Olivia Mackie in Treatment: 4 Education Assessment Education Provided To: Patient Education Topics Provided Venous: Methods: Explain/Verbal, Printed Responses: State content correctly Wound/Skin Impairment: Methods: Demonstration, Explain/Verbal, Printed Responses: State content correctly Electronic Signature(s) Signed: 07/21/2021 4:27:43 PM By: Lorrin Jackson Entered By: Lorrin Jackson on 07/21/2021  09:08:24 -------------------------------------------------------------------------------- Wound Assessment Details Patient Name: Date of Service: Melissa Leach C. 07/21/2021 8:45 A M Medical Record Number: [id number] Patient Account Number: [id number] Date of Birth/Sex: Treating RN: Feb 15, 1935 (87 y.o. [name] Primary Care Wolfe Camarena: Antony Contras Other Clinician: Referring Travious Vanover: Treating Samari Gorby/Extender: Olivia Mackie in Treatment: 4 Wound Status Wound Number: 1 Primary Etiology: Trauma, Other Wound Location: Right, Anterior Lower Leg Wound Status: Open Wounding Event: Trauma Comorbid History: Asthma, Arrhythmia, Hypertension Date Acquired: 06/04/2021 Weeks Of Treatment: 4 Clustered Wound: No Photos Wound Measurements Length: (cm) 6.5 Width: (cm) 4.7 Depth: (cm) 0.2 Area: (cm) 23.994 Volume: (cm) 4.799 % Reduction in Area: 75.2% % Reduction in Volume: 50.3% Epithelialization: Medium (34-66%) Tunneling: No Undermining: No Wound Description Classification: Full Thickness Without Exposed Support Structures Wound Margin: Distinct, outline attached Exudate Amount: Medium Exudate Type: Serosanguineous Exudate Color: red, brown Foul Odor After Cleansing: No Slough/Fibrino Yes Wound Bed Granulation Amount: Medium (34-66%) Exposed Structure Granulation Quality: Red Fascia Exposed: No Necrotic Amount: Medium (34-66%) Fat Layer (Subcutaneous Tissue) Exposed: Yes Necrotic Quality: Eschar, Adherent Slough Tendon Exposed: No Muscle Exposed: No Joint Exposed: No Bone Exposed: No Electronic Signature(s) Signed: 07/21/2021 4:27:43 PM By: Lorrin Jackson Signed: 08/04/2021 8:46:54 AM By: Erenest Blank Entered By: Erenest Blank on 07/21/2021 09:13:26 -------------------------------------------------------------------------------- Vitals Details Patient Name: Date of Service: Melissa Leach C. 07/21/2021 8:45 A M Medical  Record Number: [id number] Patient Account Number: [id number] Date of Birth/Sex: Treating RN: Jun 25, 1934 (87 y.o. [name] Primary Care Oluwadara Gorman: Antony Contras Other Clinician: Referring Zayon Trulson: Treating Sara Keys/Extender: Olivia Mackie in Treatment: 4 Vital Signs Time Taken: 09:01 Temperature (F): 97.9 Height (in): 64 Pulse (bpm): 94 Weight (lbs): 118  Respiratory Rate (breaths/min): 17 Body Mass Index (BMI): 20.3 Blood Pressure (mmHg): 138/78 Reference Range: 80 - 120 mg / dl Electronic Signature(s) Signed: 08/04/2021 8:46:54 AM By: Erenest Blank Entered By: Erenest Blank on 07/21/2021 09:02:24

## 2021-08-10 ENCOUNTER — Encounter (HOSPITAL_BASED_OUTPATIENT_CLINIC_OR_DEPARTMENT_OTHER): Payer: Medicare Other | Admitting: Internal Medicine

## 2021-08-10 DIAGNOSIS — I10 Essential (primary) hypertension: Secondary | ICD-10-CM | POA: Diagnosis not present

## 2021-08-10 DIAGNOSIS — I872 Venous insufficiency (chronic) (peripheral): Secondary | ICD-10-CM | POA: Diagnosis not present

## 2021-08-10 DIAGNOSIS — Z7901 Long term (current) use of anticoagulants: Secondary | ICD-10-CM | POA: Diagnosis not present

## 2021-08-10 DIAGNOSIS — L97912 Non-pressure chronic ulcer of unspecified part of right lower leg with fat layer exposed: Secondary | ICD-10-CM | POA: Diagnosis not present

## 2021-08-10 DIAGNOSIS — I482 Chronic atrial fibrillation, unspecified: Secondary | ICD-10-CM | POA: Diagnosis not present

## 2021-08-10 DIAGNOSIS — T798XXA Other early complications of trauma, initial encounter: Secondary | ICD-10-CM | POA: Diagnosis not present

## 2021-08-10 NOTE — Progress Notes (Signed)
Pickelsimer, Beckett C. ([id number]) Visit Report for 08/10/2021 Chief Complaint Document Details Patient Name: Date of Service: Melissa ILER, [name] 08/10/2021 8:00 A M Medical Record Number: [id number] Patient Account Number: [id number] Date of Birth/Sex: Treating RN: 10/31/1934 (86 y.o. F) Melissa Randolph Primary Care Provider: Swayne, Randolph Other Clinician: Referring Provider: Treating Provider/Extender: Melissa Randolph Melissa Randolph Weeks in Treatment: 7 Information Obtained from: Patient Chief Complaint 06/22/2021; Right lower extremity wound following trauma Electronic Signature(s) Signed: 08/10/2021 8:44:53 AM By: Hoffman, Jessica DO Entered By: Melissa Randolph on 08/10/2021 08:42:16 -------------------------------------------------------------------------------- Debridement Details Patient Name: Date of Service: Melissa ILER, [name] 08/10/2021 8:00 A M Medical Record Number: [id number] Patient Account Number: [id number] Date of Birth/Sex: Treating RN: 05/12/1934 (86 y.o. F) Melissa Randolph Primary Care Provider: Swayne, Randolph Other Clinician: Referring Provider: Treating Provider/Extender: Melissa Randolph Melissa Randolph Weeks in Treatment: 7 Debridement Performed for Assessment: Wound #1 Right,Anterior Lower Leg Performed By: Physician Hoffman, Jessica, DO Debridement Type: Debridement Level of Consciousness (Pre-procedure): Awake and Alert Pre-procedure Verification/Time Out Yes - 08:29 Taken: Start Time: 08:30 Pain Control: Other : Benzocaine T Area Debrided (L x W): otal 4 (cm) x 3.5 (cm) = 14 (cm) Tissue and other material debrided: Non-Viable, Slough, Subcutaneous, Slough Level: Skin/Subcutaneous Tissue Debridement Description: Excisional Instrument: Curette, Other : Silver Nitrate Bleeding: Minimum Hemostasis Achieved: Pressure End Time: 08:36 Response to Treatment: Procedure was tolerated well Level of Consciousness (Post- Awake and Alert procedure): Post  Debridement Measurements of Total Wound Length: (cm) 4 Width: (cm) 3.5 Depth: (cm) 0.2 Volume: (cm) 2.199 Character of Wound/Ulcer Post Debridement: Stable Post Procedure Diagnosis Same as Pre-procedure Electronic Signature(s) Signed: 08/10/2021 8:44:53 AM By: Hoffman, Jessica DO Signed: 08/10/2021 5:16:31 PM By: Melissa Randolph Entered By: Melissa Randolph on 08/10/2021 08:40:49 -------------------------------------------------------------------------------- HPI Details Patient Name: Date of Service: Melissa ILER, [name] 08/10/2021 8:00 A M Medical Record Number: [id number] Patient Account Number: [id number] Date of Birth/Sex: Treating RN: 11/14/1934 (86 y.o. F) Melissa Randolph Primary Care Provider: Swayne, Randolph Other Clinician: Referring Provider: Treating Provider/Extender: Melissa Randolph Melissa Randolph Weeks in Treatment: 7 History of Present Illness HPI Description: Admission 06/22/2021 [name] is an 86-year-old female with a past medical history of A-fib on Eliquis that presents to the clinic for a wound to her right lower extremity. She fell over a wooden baseboard on 4/13. She went to the ED for evaluation and she had 15 stitches placed. She had an x-ray of her leg that showed no acute osseous abnormality. She has been placing Xeroform over the wound bed intermittently. She visited the ED again on 4/21 because of wound infection. She was started on doxycycline and cefadroxil. She states she has a couple days remaining. She currently denies systemic signs of infection. 5/8; patient presents for follow-up. She has been using bacitracin and Xeroform to the wound bed. She reports significant improvement in her pain to the wound bed. She states she is using her compression stockings daily. She denies signs of infection. 5/15; patient presents for follow-up. She has been using Iodoflex to the wound bed. She has no issues or complaints today. She has been using  her compression stocking daily. She went to vein and vascular to have her ABIs with TBI's completed. On the right she had an ABI of 1.17 and TBI of 0.68. 5/22; patient presents for follow-up. We switched to Hydrofera Blue and antibiotic ointment under a compression wrap at last clinic visit. She tolerated the wrap well. She has no issues or   complaints today. She denies signs of infection. 5/30; patient presents for follow-up. We have been using Hydrofera Blue and antibiotic under compression. The Hydrofera Blue was stuck on tightly and had moved out of place under the wrap. She currently denies signs of infection. She is tolerating the compression wrap well. 6/6; patient changed to collagen last week also using Santyl. Dimensions are a lot better this week. She was also put under compression last week with 3 layer 6/13; patient presents for follow-up. We have been using collagen and Santyl under 3 layer compression. She continues to tolerate this well. She has no issues or complaints today. She denies signs of infection. 6/19; patient presents for follow-up. We have been using collagen and Santyl under 3 layer compression. She has no issues or complaints today. 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 She has been more active. She currently denies signs of infection. Electronic Signature(s) Signed: 08/10/2021 8:44:53 AM By: Melissa Bufano DO Entered By: Melissa Randolph on 08/10/2021 08:42:45 -------------------------------------------------------------------------------- Physical Exam Details Patient Name: Date of Service: Melissa ILER, [name] 08/10/2021 8:00 A M Medical Record Number: [id number] Patient Account Number: [id number] Date of Birth/Sex: Treating RN: 01/22/1935 (86 y.o. F) Melissa Randolph Primary Care Provider: Swayne, Randolph Other Clinician: Referring Provider: Treating Provider/Extender: Melissa Randolph Melissa Randolph Weeks in Treatment:  7 Constitutional respirations regular, non-labored and within target range for patient.. Cardiovascular 2+ dorsalis pedis/posterior tibialis pulses. Psychiatric pleasant and cooperative. Notes Right lower extremity: Open wound to the anterior aspect with nonviable tissue and hypergranulated tissue present. No surrounding signs of infection. Good edema control. Electronic Signature(s) Signed: 08/10/2021 8:44:53 AM By: Basir Niven DO Entered By: Makaelah Cranfield on 08/10/2021 08:43:15 -------------------------------------------------------------------------------- Physician Orders Details Patient Name: Date of Service: Melissa ILER, [name] 08/10/2021 8:00 A M Medical Record Number: [id number] Patient Account Number: [id number] Date of Birth/Sex: Treating RN: 10/11/1934 (86 y.o. 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 - Dr. Ilo Beamon and Randolph (Room # 7) 08/17/2021 @ 8:45am Return A Bathing/ Shower/ Hygiene May shower with protection but do not get wound dressing(s) wet. - purchase cast protector from [facility], [facility], [employer] Edema Control - Lymphedema / SCD / Other Elevate legs to the level of the heart or above for 30 minutes daily and/or when sitting, a frequency of: - 2-3 times per day void standing for long periods of time. - limit time standing still, sit when possible, wear compression  stockings during day A Exercise regularly - light walking Wound Treatment Wound #1 - Lower Leg Wound Laterality: Right, Anterior Cleanser: Soap and Water 1 x Per Week/30 Days Discharge Instructions: May shower and wash wound with dial antibacterial soap and water prior to dressing change. Cleanser: Byram Ancillary Kit - 15 Day Supply (Generic) 1 x Per Week/30 Days Discharge Instructions: Use supplies as instructed; Kit contains: (15) Saline Bullets; (15) 3x3 Gauze; 15 pr Gloves Peri-Wound Care: Sween Lotion (Moisturizing lotion) 1 x Per Week/30 Days Discharge Instructions: Apply moisturizing lotion as directed Prim Dressing: PolyMem Silver   Non-Adhesive Dressing, 4.25x4.25 in 1 x Per Week/30 Days ary Discharge Instructions: Apply to wound bed as instructed Secondary Dressing: ABD Pad, 8x10 1 x Per Week/30 Days Discharge Instructions: Apply over primary dressing as directed. Compression Wrap: ThreePress (3 layer compression wrap) 1 x Per Week/30 Days Discharge Instructions: Apply three layer compression as directed. Electronic Signature(s) Signed: 08/10/2021 8:44:53 AM By: Kalman Shan DO Entered By: Kalman Shan on 08/10/2021 08:43:27 -------------------------------------------------------------------------------- Problem List Details Patient Name: Date of Service: [name] 08/10/2021 8:00 A M Medical Record Number: [id number] Patient Account Number: [id number] Date of Birth/Sex: Treating RN: [birth year] (87 y.o. [name] Primary Care Provider: Antony Contras Other Clinician: Referring Provider: Treating Provider/Extender: Olivia Mackie in Treatment: 7 Active Problems ICD-10 Encounter Code Description Active Date MDM Diagnosis [phone] Non-pressure chronic ulcer of unspecified part of right lower leg with fat layer 06/22/2021 No Yes exposed T79.8XXA Other early complications of trauma, initial encounter 06/22/2021 No Yes I48.20  Chronic atrial fibrillation, unspecified 06/22/2021 No Yes Z79.01 Long term (current) use of anticoagulants 06/22/2021 No Yes I10 Essential (primary) hypertension 06/22/2021 No Yes I87.2 Venous insufficiency (chronic) (peripheral) 07/06/2021 No Yes Inactive Problems Resolved Problems Electronic Signature(s) Signed: 08/10/2021 8:44:53 AM By: Kalman Shan DO Previous Signature: 08/10/2021 8:01:44 AM Version By: Lorrin Jackson Entered By: Kalman Shan on 08/10/2021 08:41:58 -------------------------------------------------------------------------------- Progress Note Details Patient Name: Date of Service: [name] 08/10/2021 8:00 A M Medical Record Number: [id number] Patient Account Number: [id number] Date of Birth/Sex: Treating RN: 22-Feb-1935 (87 y.o. [name] Primary Care Provider: Antony Contras Other Clinician: Referring Provider: Treating Provider/Extender: Olivia Mackie in Treatment: 7 Subjective Chief Complaint Information obtained from Patient 06/22/2021; Right lower extremity wound following trauma History of Present Illness (HPI) Admission 06/22/2021 [name] is an 87 year old female with a past medical history of A-fib on Eliquis that presents to the clinic for a wound to her right lower extremity. She fell over a wooden baseboard on 4/13. She went to the ED for evaluation and she had 15 stitches placed. She had an x-ray of her leg that showed no acute osseous abnormality. She has been placing Xeroform over the wound bed intermittently. She visited the ED again on 4/21 because of wound infection. She was started on doxycycline and cefadroxil. She states she has a couple days remaining. She currently denies systemic signs of infection. 5/8; patient presents for follow-up. She has been using bacitracin and Xeroform to the wound bed. She reports significant improvement in her pain to the wound bed. She states she is using her  compression stockings daily. She denies signs of infection. 5/15; patient presents for follow-up. She has been using Iodoflex to the wound bed. She has no issues or complaints today. She has been using her compression stocking daily. She went to vein and vascular to have her ABIs with TBI's completed. On the right she had an ABI of 1.17 and TBI of 0.68. 5/22; patient presents for follow-up. We switched to [facility] and antibiotic ointment under a compression wrap at last clinic visit. She tolerated the wrap well. She has no issues or complaints today. She denies signs of infection. 5/30; patient presents for follow-up. We have been using Hydrofera Blue and antibiotic under compression. The Hydrofera Blue was stuck on tightly and had moved out of place under the wrap. She currently denies signs of infection. She is tolerating the compression wrap well. 6/6; patient changed to collagen last  week also using Santyl. Dimensions are a lot better this week. She was also put under compression last week with 3 layer 6/13; patient presents for follow-up. We have been using collagen and Santyl under 3 layer compression. She continues to tolerate this well. She has no issues or complaints today. She denies signs of infection. 6/19; patient presents for follow-up. We have been using collagen and Santyl under 3 layer compression. She has no issues or complaints today. 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 She has been more active. She currently denies signs of infection. Patient History Information obtained from Patient. Family History Cancer - Father, Heart Disease - Father, Hypertension - Father, No family history of Diabetes, Kidney Disease, Lung Disease, Seizures, Stroke, Thyroid Problems, Tuberculosis. Social History Never smoker, Marital Status - Married, Alcohol Use - Never, Drug Use - No History, Caffeine Use - Never. Medical History Respiratory Patient has  history of Asthma Cardiovascular Patient has history of Arrhythmia - A-fib, Hypertension Psychiatric Denies history of Anorexia/bulimia, Confinement Anxiety Hospitalization/Surgery History - breast surgery 8 years ago. Medical A Surgical History Notes nd Eyes cataract surgery both eyes Cardiovascular hypercholesterolemia Objective Constitutional respirations regular, non-labored and within target range for patient.. Vitals Time Taken: 8:06 AM, Height: 64 in, Weight: 118 lbs, BMI: 20.3, Temperature: 98.8 F, Pulse: 98 bpm, Respiratory Rate: 18 breaths/min, Blood Pressure: 162/72 mmHg. Cardiovascular 2+ dorsalis pedis/posterior tibialis pulses. Psychiatric pleasant and cooperative. General Notes: Right lower extremity: Open wound to the anterior aspect with nonviable tissue and hypergranulated tissue present. No surrounding signs of infection. Good edema control. Integumentary (Hair, Skin) Wound #1 status is Open. Original cause of wound was Trauma. The date acquired was: 06/04/2021. The wound has been in treatment 7 weeks. The wound is located on the Right,Anterior Lower Leg. The wound measures 4cm length x 3.5cm width x 0.2cm depth; 10.996cm^2 area and 2.199cm^3 volume. There is Fat Layer (Subcutaneous Tissue) exposed. There is no tunneling or undermining noted. There is a medium amount of serosanguineous drainage noted. The wound margin is distinct with the outline attached to the wound base. There is large (67-100%) red, pink granulation within the wound bed. There is a small (1-33%) amount of necrotic tissue within the wound bed including Eschar and Adherent Slough. 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 I debrided nonviable tissue. I used silver nitrate to the hyper granulated areas. I recommended switching the dressing to PolyMem silver to help with further debridement and wound healing. We will continue 3 layer compression. Follow-up in 1 week. Procedures Wound #1 Pre-procedure diagnosis of Wound #1 is a Trauma, Other located on the Right,Anterior Lower Leg . There was a Excisional Skin/Subcutaneous Tissue Debridement with a total area of 14 sq cm performed by Kalman Shan, DO. With the following instrument(s): Curette, Silver Nitrate to remove Non-Viable tissue/material. Material removed includes Subcutaneous Tissue and Slough and after achieving pain control using Other (Benzocaine). No specimens were taken. A time out was conducted at 08:29, prior to the start of the procedure. A Minimum amount of bleeding was controlled with Pressure. The procedure was tolerated well. Post Debridement Measurements: 4cm length x 3.5cm width x 0.2cm depth; 2.199cm^3 volume. Character of  Wound/Ulcer Post Debridement is stable. Post procedure Diagnosis Wound #1: Same as Pre-Procedure Pre-procedure diagnosis of Wound #1 is a Trauma, Other located on the Right,Anterior Lower Leg . There was a Three Layer Compression Therapy Procedure by Lorrin Jackson, RN. Post procedure Diagnosis Wound #1: Same as Pre-Procedure Plan Follow-up Appointments: Return Appointment in 1 week. - Dr. Heber Dodson and Leveda Anna (Room # 7) 08/17/2021 @ 8:45am Bathing/ Shower/ Hygiene: May shower with protection but do not get wound dressing(s) wet. - purchase cast protector from [facility], [facility], [place] Edema Control - Lymphedema / SCD / Other: Elevate legs to the level of the heart or above for 30 minutes daily and/or when sitting, a frequency of: - 2-3 times per day Avoid standing for long periods of time. - limit time standing still, sit when  possible, wear compression stockings during day Exercise regularly - light walking WOUND #1: - Lower Leg Wound Laterality: Right, Anterior Cleanser: Soap and Water 1 x Per Week/30 Days Discharge Instructions: May shower and wash wound with dial antibacterial soap and water prior to dressing change. Cleanser: [employer] Ancillary Kit - 15 Day Supply (Generic) 1 x Per Week/30 Days Discharge Instructions: Use supplies as instructed; Kit contains: (15) Saline Bullets; (15) 3x3 Gauze; 15 pr Gloves Peri-Wound Care: Sween Lotion (Moisturizing lotion) 1 x Per Week/30 Days Discharge Instructions: Apply moisturizing lotion as directed Prim Dressing: PolyMem Silver Non-Adhesive Dressing, 4.25x4.25 in 1 x Per Week/30 Days ary Discharge Instructions: Apply to wound bed as instructed Secondary Dressing: ABD Pad, 8x10 1 x Per Week/30 Days Discharge Instructions: Apply over primary dressing as directed. Com pression Wrap: ThreePress (3 layer compression wrap) 1 x Per Week/30 Days Discharge Instructions: Apply three layer compression as directed. 1. In office sharp debridement 2. PolyMem silver under 3 layer compression 3. Follow-up in 1 week Electronic Signature(s) Signed: 08/10/2021 8:44:53 AM By: Kalman Shan DO Entered By: Kalman Shan on 08/10/2021 08:44:16 -------------------------------------------------------------------------------- HxROS Details Patient Name: Date of Service: [name] 08/10/2021 8:00 A M Medical Record Number: [id number] Patient Account Number: [id number] Date of Birth/Sex: Treating RN: [birth year] (87 y.o. [name] Primary Care Provider: Antony Contras Other Clinician: Referring Provider: Treating Provider/Extender: Olivia Mackie in Treatment: 7 Information Obtained From Patient Eyes Medical History: Past Medical History Notes: cataract surgery both eyes Respiratory Medical History: Positive for:  Asthma Cardiovascular Medical History: Positive for: Arrhythmia - A-fib; Hypertension Past Medical History Notes: hypercholesterolemia Psychiatric Medical History: Negative for: Anorexia/bulimia; Confinement Anxiety Immunizations Pneumococcal Vaccine: Received Pneumococcal Vaccination: Yes Received Pneumococcal Vaccination On or After 60th Birthday: Yes Implantable Devices None Hospitalization / Surgery History Type of Hospitalization/Surgery breast surgery 8 years ago Family and Social History Cancer: Yes - Father; Diabetes: No; Heart Disease: Yes - Father; Hypertension: Yes - Father; Kidney Disease: No; Lung Disease: No; Seizures: No; Stroke: No; Thyroid Problems: No; Tuberculosis: No; Never smoker; Marital Status - Married; Alcohol Use: Never; Drug Use: No History; Caffeine Use: Never; Financial Concerns: No; Food, Clothing or Shelter Needs: No; Support System Lacking: No; Transportation Concerns: No Electronic Signature(s) Signed: 08/10/2021 8:44:53 AM By: Kalman Shan DO Signed: 08/10/2021 5:16:31 PM By: Lorrin Jackson Entered By: Kalman Shan on 08/10/2021 08:42:51 -------------------------------------------------------------------------------- SuperBill Details Patient Name: Date of Service: [name] 08/10/2021 Medical Record Number: [id number] Patient Account Number: [id number] Date of Birth/Sex: Treating RN: 04-24-1934 (87 y.o. [name] Primary Care Provider: Antony Contras Other Clinician: Referring Provider: Treating Provider/Extender: Olivia Mackie in Treatment: 7  Diagnosis Coding ICD-10 Codes Code Description R41.638 Non-pressure chronic ulcer of unspecified part of right lower leg with fat layer exposed T79.8XXA Other early complications of trauma, initial encounter I48.20 Chronic atrial fibrillation, unspecified Z79.01 Long term (current) use of anticoagulants I10 Essential (primary) hypertension I87.2  Venous insufficiency (chronic) (peripheral) Facility Procedures CPT4 Code: [id number] Description: 32122 - DEB SUBQ TISSUE 20 SQ CM/< ICD-10 Diagnosis Description Q82.500 Non-pressure chronic ulcer of unspecified part of right lower leg with fat lay Modifier: er exposed Quantity: 1 Physician Procedures : CPT4 Code Description Modifier [id number] - WC PHYS SUBQ TISS 20 SQ CM ICD-10 Diagnosis Description B16.945 Non-pressure chronic ulcer of unspecified part of right lower leg with fat layer exposed Quantity: 1 Electronic Signature(s) Signed: 08/10/2021 8:44:53 AM By: Kalman Shan DO Entered By: Kalman Shan on 08/10/2021 08:44:24

## 2021-08-10 NOTE — Progress Notes (Addendum)
IESHIA, HATCHER ([id number]) Visit Report for 08/10/2021 Arrival Information Details Patient Name: Date of Service: [name] 08/10/2021 8:00 A M Medical Record Number: [id number] Patient Account Number: [id number] Date of Birth/Sex: Treating RN: 04/27/1934 (87 y.o. [name] Primary Care Zanita Millman: Antony Contras Other Clinician: Referring Tannia Contino: Treating Sanda Dejoy/Extender: Olivia Mackie in Treatment: 7 Visit Information History Since Last Visit Added or deleted any medications: No Patient Arrived: Ambulatory Any new allergies or adverse reactions: No Arrival Time: 08:03 Had a fall or experienced change in No Transfer Assistance: None activities of daily living that may affect Patient Identification Verified: Yes risk of falls: Secondary Verification Process Completed: Yes Signs or symptoms of abuse/neglect since last visito No Patient Requires Transmission-Based Precautions: No Hospitalized since last visit: No Patient Has Alerts: Yes Implantable device outside of the clinic excluding No Patient Alerts: Patient on Blood Thinner cellular tissue based products placed in the center Eliquis since last visit: Has Dressing in Place as Prescribed: Yes Has Compression in Place as Prescribed: Yes Pain Present Now: No Electronic Signature(s) Signed: 08/10/2021 5:16:31 PM By: Lorrin Jackson Entered By: Lorrin Jackson on 08/10/2021 08:05:59 -------------------------------------------------------------------------------- Compression Therapy Details Patient Name: Date of Service: [name] 08/10/2021 8:00 A M Medical Record Number: [id number] Patient Account Number: [id number] Date of Birth/Sex: Treating RN: [birth year] (87 y.o. [name] Primary Care Ahmani Prehn: Antony Contras Other Clinician: Referring Evalynn Hankins: Treating Naviyah Schaffert/Extender: Olivia Mackie in Treatment: 7 Compression Therapy  Performed for Wound Assessment: Wound #1 Right,Anterior Lower Leg Performed By: Clinician Lorrin Jackson, RN Compression Type: Three Layer Post Procedure Diagnosis Same as Pre-procedure Electronic Signature(s) Signed: 08/10/2021 5:16:31 PM By: Lorrin Jackson Entered By: Lorrin Jackson on 08/10/2021 08:36:36 -------------------------------------------------------------------------------- Encounter Discharge Information Details Patient Name: Date of Service: [name] 08/10/2021 8:00 A M Medical Record Number: [id number] Patient Account Number: [id number] Date of Birth/Sex: Treating RN: 09/23/1934 (87 y.o. [name] Primary Care Smt. Loder: Antony Contras Other Clinician: Referring Trini Soldo: Treating Juniel Groene/Extender: Olivia Mackie in Treatment: 7 Encounter Discharge Information Items Post Procedure Vitals Discharge Condition: Stable Temperature (F): 98.8 Ambulatory Status: Ambulatory Pulse (bpm): 98 Discharge Destination: Home Respiratory Rate (breaths/min): 18 Transportation: Private Auto Blood Pressure (mmHg): 162/72 Schedule Follow-up Appointment: Yes Clinical Summary of Care: Provided on 08/10/2021 Form Type Recipient Paper Patient Patient Electronic Signature(s) Signed: 08/10/2021 5:16:31 PM By: Lorrin Jackson Entered By: Lorrin Jackson on 08/10/2021 08:55:32 -------------------------------------------------------------------------------- Lower Extremity Assessment Details Patient Name: Date of Service: [name] 08/10/2021 8:00 A M Medical Record Number: [id number] Patient Account Number: [id number] Date of Birth/Sex: Treating RN: Jul 21, 1934 (87 y.o. [name] Primary Care Shaneya Taketa: Antony Contras Other Clinician: Referring Ardis Lawley: Treating Nikiyah Fackler/Extender: Olivia Mackie in Treatment: 7 Edema Assessment Assessed: [name]: No] [name]: Yes] Edema: [Left: Ye] [Right: s] Calf Left:  Right: Point of Measurement: 28 cm From Medial Instep 31.5 cm Ankle Left: Right: Point of Measurement: 10 cm From Medial Instep 21 cm Vascular Assessment Pulses: Dorsalis Pedis Palpable: [Right:Yes] Electronic Signature(s) Signed: 08/10/2021 5:16:31 PM By: Lorrin Jackson Entered By: Lorrin Jackson on 08/10/2021 08:12:17 -------------------------------------------------------------------------------- Multi Wound Chart Details Patient Name: Date of Service: [name] 08/10/2021 8:00 A M Medical Record Number: [id number] Patient Account Number: [id number] Date of Birth/Sex: Treating RN: [birth year] (87 y.o. [name] Primary Care Mumin Denomme: Antony Contras Other Clinician: Referring Camari Wisham: Treating Ahmya Bernick/Extender: Olivia Mackie in Treatment: 7  Vital Signs Height(in): 64 Pulse(bpm): 98 Weight(lbs): 118 Blood Pressure(mmHg): 162/72 Body Mass Index(BMI): 20.3 Temperature(F): 98.8 Respiratory Rate(breaths/min): 18 Photos: [N/A:N/A] Right, Anterior Lower Leg N/A N/A Wound Location: Trauma N/A N/A Wounding Event: Trauma, Other N/A N/A Primary Etiology: Asthma, Arrhythmia, Hypertension N/A N/A Comorbid History: 06/04/2021 N/A N/A Date Acquired: 7 N/A N/A Weeks of Treatment: Open N/A N/A Wound Status: No N/A N/A Wound Recurrence: 4x3.5x0.2 N/A N/A Measurements L x W x D (cm) 10.996 N/A N/A A (cm) : rea 2.199 N/A N/A Volume (cm) : 88.60% N/A N/A % Reduction in A rea: 77.20% N/A N/A % Reduction in Volume: Full Thickness Without Exposed N/A N/A Classification: Support Structures Medium N/A N/A Exudate A mount: Serosanguineous N/A N/A Exudate Type: red, brown N/A N/A Exudate Color: Distinct, outline attached N/A N/A Wound Margin: Large (67-100%) N/A N/A Granulation A mount: Red, Pink N/A N/A Granulation Quality: Small (1-33%) N/A N/A Necrotic A mount: Eschar, Adherent Slough N/A N/A Necrotic Tissue: Fat  Layer (Subcutaneous Tissue): Yes N/A N/A Exposed Structures: Fascia: No Tendon: No Muscle: No Joint: No Bone: No Medium (34-66%) N/A N/A Epithelialization: Debridement - Excisional N/A N/A Debridement: Pre-procedure Verification/Time Out 08:29 N/A N/A Taken: Other N/A N/A Pain Control: Subcutaneous, Slough N/A N/A Tissue Debrided: Skin/Subcutaneous Tissue N/A N/A Level: 14 N/A N/A Debridement A (sq cm): rea Curette, Other(Silver Nitrate) N/A N/A Instrument: Minimum N/A N/A Bleeding: Pressure N/A N/A Hemostasis A chieved: Procedure was tolerated well N/A N/A Debridement Treatment Response: 4x3.5x0.2 N/A N/A Post Debridement Measurements L x W x D (cm) 2.199 N/A N/A Post Debridement Volume: (cm) Compression Therapy N/A N/A Procedures Performed: Debridement Treatment Notes Electronic Signature(s) Signed: 08/10/2021 8:44:53 AM By: Kalman Shan DO Signed: 08/10/2021 5:16:31 PM By: Lorrin Jackson Entered By: Kalman Shan on 08/10/2021 08:42:08 -------------------------------------------------------------------------------- Multi-Disciplinary Care Plan Details Patient Name: Date of Service: [name] 08/10/2021 8:00 A M Medical Record Number: [id number] Patient Account Number: [id number] Date of Birth/Sex: Treating RN: 1935-01-14 (87 y.o. [name] Primary Care Hana Trippett: Antony Contras Other Clinician: Referring Evart Mcdonnell: Treating Annarae Macnair/Extender: Olivia Mackie in Treatment: 7 Active Inactive Abuse / Safety / Falls / Self Care Management Nursing Diagnoses: History of Falls Potential for injury related to falls Goals: Patient will not experience any injury related to falls Date Initiated: 06/22/2021 Target Resolution Date: 08/22/2021 Goal Status: Active Patient will remain injury free related to falls Date Initiated: 06/22/2021 Target Resolution Date: 08/22/2021 Goal Status: Active Patient/caregiver will  verbalize understanding of skin care regimen Date Initiated: 06/22/2021 Date Inactivated: 07/06/2021 Target Resolution Date: 07/25/2021 Goal Status: Met Patient/caregiver will verbalize/demonstrate measures taken to prevent injury and/or falls Date Initiated: 06/22/2021 Target Resolution Date: 08/22/2021 Goal Status: Active Interventions: Assess fall risk on admission and as needed Assess self care needs on admission and as needed Provide education on basic hygiene Provide education on fall prevention Treatment Activities: Education provided on Basic Hygiene : 07/06/2021 Notes: Wound/Skin Impairment Nursing Diagnoses: Impaired tissue integrity Goals: Patient/caregiver will verbalize understanding of skin care regimen Date Initiated: 06/22/2021 Date Inactivated: 07/06/2021 Target Resolution Date: 07/25/2021 Goal Status: Met Ulcer/skin breakdown will have a volume reduction of 30% by week 4 Date Initiated: 06/22/2021 Target Resolution Date: 08/22/2021 Goal Status: Active Interventions: Assess ulceration(s) every visit Provide education on ulcer and skin care Notes: Electronic Signature(s) Signed: 08/10/2021 8:01:58 AM By: Lorrin Jackson Entered By: Lorrin Jackson on 08/10/2021 08:01:57 -------------------------------------------------------------------------------- Pain Assessment Details Patient Name: Date of Service: Melbourne Abts, [name] 08/10/2021 8:00 A  M Medical Record Number: [id number] Patient Account Number: [id number] Date of Birth/Sex: Treating RN: November 16, 1934 (87 y.o. [name] Primary Care Lilou Kneip: Antony Contras Other Clinician: Referring Quinci Gavidia: Treating Dru Laurel/Extender: Olivia Mackie in Treatment: 7 Active Problems Location of Pain Severity and Description of Pain Patient Has Paino No Site Locations Pain Management and Medication Current Pain Management: Electronic Signature(s) Signed: 08/10/2021 5:16:31 PM By: Lorrin Jackson Entered By: Lorrin Jackson on 08/10/2021 08:12:03 -------------------------------------------------------------------------------- Patient/Caregiver Education Details Patient Name: Date of Service: [name] 6/19/2023andnbsp8:00 [place] Record Number: [id number] Patient Account Number: [id number] Date of Birth/Gender: Treating RN: 05-Nov-1934 (87 y.o. [name] Primary Care Physician: Antony Contras Other Clinician: Referring Physician: Treating Physician/Extender: Olivia Mackie in Treatment: 7 Education Assessment Education Provided To: Patient Education Topics Provided Venous: Methods: Explain/Verbal, Printed Responses: State content correctly Wound/Skin Impairment: Methods: Explain/Verbal, Printed Responses: State content correctly Electronic Signature(s) Signed: 08/10/2021 5:16:31 PM By: Lorrin Jackson Entered By: Lorrin Jackson on 08/10/2021 08:02:15 -------------------------------------------------------------------------------- Wound Assessment Details Patient Name: Date of Service: [name] 08/10/2021 8:00 A M Medical Record Number: [id number] Patient Account Number: [id number] Date of Birth/Sex: Treating RN: 1934/02/29 (87 y.o. [name] Primary Care Shanitha Twining: Antony Contras Other Clinician: Referring Dashae Wilcher: Treating Delisa Finck/Extender: Olivia Mackie in Treatment: 7 Wound Status Wound Number: 1 Primary Etiology: Trauma, Other Wound Location: Right, Anterior Lower Leg Wound Status: Open Wounding Event: Trauma Comorbid History: Asthma, Arrhythmia, Hypertension Date Acquired: 06/04/2021 Weeks Of Treatment: 7 Clustered Wound: No Photos Wound Measurements Length: (cm) 4 Width: (cm) 3.5 Depth: (cm) 0.2 Area: (cm) 10.996 Volume: (cm) 2.199 % Reduction in Area: 88.6% % Reduction in Volume: 77.2% Epithelialization: Medium (34-66%) Tunneling: No Undermining:  No Wound Description Classification: Full Thickness Without Exposed Support Structures Wound Margin: Distinct, outline attached Exudate Amount: Medium Exudate Type: Serosanguineous Exudate Color: red, brown Foul Odor After Cleansing: No Slough/Fibrino Yes Wound Bed Granulation Amount: Large (67-100%) Exposed Structure Granulation Quality: Red, Pink Fascia Exposed: No Necrotic Amount: Small (1-33%) Fat Layer (Subcutaneous Tissue) Exposed: Yes Necrotic Quality: Eschar, Adherent Slough Tendon Exposed: No Muscle Exposed: No Joint Exposed: No Bone Exposed: No Treatment Notes Wound #1 (Lower Leg) Wound Laterality: Right, Anterior Cleanser Soap and Water Discharge Instruction: May shower and wash wound with dial antibacterial soap and water prior to dressing change. Byram Ancillary Kit - 15 Day Supply Discharge Instruction: Use supplies as instructed; Kit contains: (15) Saline Bullets; (15) 3x3 Gauze; 15 pr Gloves Peri-Wound Care Sween Lotion (Moisturizing lotion) Discharge Instruction: Apply moisturizing lotion as directed Topical Primary Dressing PolyMem Silver Non-Adhesive Dressing, 4.25x4.25 in Discharge Instruction: Apply to wound bed as instructed Secondary Dressing ABD Pad, 8x10 Discharge Instruction: Apply over primary dressing as directed. Secured With Compression Wrap ThreePress (3 layer compression wrap) Discharge Instruction: Apply three layer compression as directed. Compression Stockings Add-Ons Electronic Signature(s) Signed: 08/10/2021 5:16:31 PM By: Lorrin Jackson Entered By: Lorrin Jackson on 08/10/2021 08:14:29 -------------------------------------------------------------------------------- Vitals Details Patient Name: Date of Service: [name] 08/10/2021 8:00 A M Medical Record Number: [id number] Patient Account Number: [id number] Date of Birth/Sex: Treating RN: [birth year] (87 y.o. [name] Primary Care Asuna Peth: Antony Contras Other Clinician: Referring Minsa Weddington: Treating Quantasia Stegner/Extender: Olivia Mackie in Treatment: 7 Vital Signs Time Taken: 08:06 Temperature (F): 98.8 Height (in): 64 Pulse (bpm): 98 Weight (lbs): 118 Respiratory Rate (breaths/min): 18 Body Mass Index (BMI): 20.3 Blood Pressure (mmHg): 162/72 Reference Range: 80 -  120 mg / dl Electronic Signature(s) Signed: 08/10/2021 5:16:31 PM By: Lorrin Jackson Entered By: Lorrin Jackson on 08/10/2021 08:08:13

## 2021-08-13 DIAGNOSIS — M810 Age-related osteoporosis without current pathological fracture: Secondary | ICD-10-CM | POA: Diagnosis not present

## 2021-08-17 ENCOUNTER — Encounter (HOSPITAL_BASED_OUTPATIENT_CLINIC_OR_DEPARTMENT_OTHER): Payer: Medicare Other | Admitting: Internal Medicine

## 2021-08-17 DIAGNOSIS — D0362 Melanoma in situ of left upper limb, including shoulder: Secondary | ICD-10-CM | POA: Diagnosis not present

## 2021-08-17 DIAGNOSIS — I482 Chronic atrial fibrillation, unspecified: Secondary | ICD-10-CM | POA: Diagnosis not present

## 2021-08-17 DIAGNOSIS — L97912 Non-pressure chronic ulcer of unspecified part of right lower leg with fat layer exposed: Secondary | ICD-10-CM

## 2021-08-17 DIAGNOSIS — Z7901 Long term (current) use of anticoagulants: Secondary | ICD-10-CM | POA: Diagnosis not present

## 2021-08-17 DIAGNOSIS — I872 Venous insufficiency (chronic) (peripheral): Secondary | ICD-10-CM | POA: Diagnosis not present

## 2021-08-17 DIAGNOSIS — D485 Neoplasm of uncertain behavior of skin: Secondary | ICD-10-CM | POA: Diagnosis not present

## 2021-08-17 DIAGNOSIS — Z1231 Encounter for screening mammogram for malignant neoplasm of breast: Secondary | ICD-10-CM | POA: Diagnosis not present

## 2021-08-17 DIAGNOSIS — I10 Essential (primary) hypertension: Secondary | ICD-10-CM | POA: Diagnosis not present

## 2021-08-17 DIAGNOSIS — L988 Other specified disorders of the skin and subcutaneous tissue: Secondary | ICD-10-CM | POA: Diagnosis not present

## 2021-08-17 DIAGNOSIS — T798XXA Other early complications of trauma, initial encounter: Secondary | ICD-10-CM | POA: Diagnosis not present

## 2021-08-24 ENCOUNTER — Encounter (HOSPITAL_BASED_OUTPATIENT_CLINIC_OR_DEPARTMENT_OTHER): Payer: Medicare Other | Attending: Internal Medicine | Admitting: General Surgery

## 2021-08-24 DIAGNOSIS — I872 Venous insufficiency (chronic) (peripheral): Secondary | ICD-10-CM | POA: Insufficient documentation

## 2021-08-24 DIAGNOSIS — T798XXA Other early complications of trauma, initial encounter: Secondary | ICD-10-CM | POA: Insufficient documentation

## 2021-08-24 DIAGNOSIS — Z7901 Long term (current) use of anticoagulants: Secondary | ICD-10-CM | POA: Insufficient documentation

## 2021-08-24 DIAGNOSIS — I482 Chronic atrial fibrillation, unspecified: Secondary | ICD-10-CM | POA: Diagnosis not present

## 2021-08-24 DIAGNOSIS — I10 Essential (primary) hypertension: Secondary | ICD-10-CM | POA: Insufficient documentation

## 2021-08-24 DIAGNOSIS — W19XXXA Unspecified fall, initial encounter: Secondary | ICD-10-CM | POA: Diagnosis not present

## 2021-08-24 DIAGNOSIS — L97912 Non-pressure chronic ulcer of unspecified part of right lower leg with fat layer exposed: Secondary | ICD-10-CM | POA: Diagnosis not present

## 2021-08-24 NOTE — Progress Notes (Signed)
[name], [name] ([id number]) Visit Report for 08/24/2021 Arrival Information Details Patient Name: Date of Service: Melissa Randolph 08/24/2021 8:30 A M Medical Record Number: [id number] Patient Account Number: [id number] Date of Birth/Sex: Treating RN: [birth year] (87 y.o. Melissa Randolph Primary Care Dreonna Hussein: Antony Contras Other Clinician: Referring Zilah Villaflor: Treating Calum Cormier/Extender: Anola Gurney in Treatment: 9 Visit Information History Since Last Visit Added or deleted any medications: No Patient Arrived: Ambulatory Any new allergies or adverse reactions: No Arrival Time: 08:42 Had a fall or experienced change in No Accompanied By: self activities of daily living that may affect Transfer Assistance: None risk of falls: Patient Identification Verified: Yes Signs or symptoms of abuse/neglect since last visito No Secondary Verification Process Completed: Yes Hospitalized since last visit: No Patient Requires Transmission-Based Precautions: No Implantable device outside of the clinic excluding No Patient Has Alerts: Yes cellular tissue based products placed in the center Patient Alerts: Patient on Blood Thinner since last visit: Eliquis Has Dressing in Place as Prescribed: Yes Has Compression in Place as Prescribed: Yes Pain Present Now: No Electronic Signature(s) Signed: 08/24/2021 11:18:51 AM By: Sharyn Creamer RN, BSN Entered By: Sharyn Creamer on 08/24/2021 08:43:06 -------------------------------------------------------------------------------- Compression Therapy Details Patient Name: Date of Service: Melissa Leach C. 08/24/2021 8:30 A M Medical Record Number: [id number] Patient Account Number: [id number] Date of Birth/Sex: Treating RN: 1934/09/12 (87 y.o. Melissa Randolph Primary Care [name]: Antony Contras Other Clinician: Referring Dodd Schmid: Treating [name]/Extender: Anola Gurney in Treatment:  9 Compression Therapy Performed for Wound Assessment: Wound #1 Right,Anterior Lower Leg Performed By: Clinician Sharyn Creamer, RN Compression Type: Three Hydrologist) Signed: 08/24/2021 11:18:51 AM By: Sharyn Creamer RN, BSN Entered By: Sharyn Creamer on 08/24/2021 09:00:59 -------------------------------------------------------------------------------- Encounter Discharge Information Details Patient Name: Date of Service: Melissa Leach C. 08/24/2021 8:30 A M Medical Record Number: [id number] Patient Account Number: [id number] Date of Birth/Sex: Treating RN: 04/16/1934 (87 y.o. Melissa Randolph Primary Care [name]: Antony Contras Other Clinician: Referring [name]: Treating [name]/Extender: Anola Gurney in Treatment: 9 Encounter Discharge Information Items Discharge Condition: Stable Ambulatory Status: Ambulatory Discharge Destination: Home Transportation: Private Auto Accompanied By: self Schedule Follow-up Appointment: Yes Clinical Summary of Care: Patient Declined Electronic Signature(s) Signed: 08/24/2021 11:18:51 AM By: Sharyn Creamer RN, BSN Entered By: Sharyn Creamer on 08/24/2021 09:01:54 -------------------------------------------------------------------------------- Patient/Caregiver Education Details Patient Name: Date of Service: Melissa Randolph 7/3/2023andnbsp8:30 [place] Record Number: [id number] Patient Account Number: [id number] Date of Birth/Gender: Treating RN: Mar 16, 1934 (87 y.o. Melissa Randolph Primary Care Physician: Antony Contras Other Clinician: Referring Physician: Treating Physician/Extender: Anola Gurney in Treatment: 9 Education Assessment Education Provided To: Patient Education Topics Provided Wound/Skin Impairment: Methods: Explain/Verbal Responses: State content correctly [employer]) Signed: 08/24/2021 11:18:51 AM By: Sharyn Creamer RN,  BSN Entered By: Sharyn Creamer on 08/24/2021 09:01:31 -------------------------------------------------------------------------------- Wound Assessment Details Patient Name: Date of Service: Melissa Leach C. 08/24/2021 8:30 A M Medical Record Number: [id number] Patient Account Number: [id number] Date of Birth/Sex: Treating RN: March 07, 1934 (87 y.o. Melissa Randolph Primary Care [name]: Antony Contras Other Clinician: Referring [name]: Treating Jilliane Kazanjian/Extender: Anola Gurney in Treatment: 9 Wound Status Wound Number: 1 Primary Etiology: Trauma, Other Wound Location: Right, Anterior Lower Leg Wound Status: Open Wounding Event: Trauma Comorbid History: Asthma, Arrhythmia, Hypertension Date Acquired: 06/04/2021 Weeks Of Treatment: 9 Clustered Wound: No Wound Measurements Length: (cm) 3.5 Width: (cm) 3.5 Depth: (cm) 0.1  Area: (cm) 9.621 Volume: (cm) 0.962 % Reduction in Area: 90% % Reduction in Volume: 90% Epithelialization: Medium (34-66%) Tunneling: No Undermining: No Wound Description Classification: Full Thickness Without Exposed Support Structures Wound Margin: Distinct, outline attached Exudate Amount: Medium Exudate Type: Serosanguineous Exudate Color: red, brown Foul Odor After Cleansing: No Slough/Fibrino Yes Wound Bed Granulation Amount: Large (67-100%) Exposed Structure Granulation Quality: Red, Pink Fascia Exposed: No Necrotic Amount: Small (1-33%) Fat Layer (Subcutaneous Tissue) Exposed: Yes Necrotic Quality: Eschar, Adherent Slough Tendon Exposed: No Muscle Exposed: No Joint Exposed: No Bone Exposed: No Treatment Notes Wound #1 (Lower Leg) Wound Laterality: Right, Anterior Cleanser Soap and Water Discharge Instruction: May shower and wash wound with dial antibacterial soap and water prior to dressing change. Byram Ancillary Kit - 15 Day Supply Discharge Instruction: Use supplies as instructed; Kit contains: (15)  Saline Bullets; (15) 3x3 Gauze; 15 pr Gloves Peri-Wound Care Sween Lotion (Moisturizing lotion) Discharge Instruction: Apply moisturizing lotion as directed Topical Primary Dressing Hydrofera Blue Ready Foam, 4x5 in Discharge Instruction: Apply to wound bed as instructed Secondary Dressing ABD Pad, 8x10 Discharge Instruction: Apply over primary dressing as directed. Secured With Compression Wrap ThreePress (3 layer compression wrap) Discharge Instruction: Apply three layer compression as directed. Compression Stockings Add-Ons Electronic Signature(s) Signed: 08/24/2021 11:18:51 AM By: Sharyn Creamer RN, BSN Entered By: Sharyn Creamer on 08/24/2021 09:00:40 -------------------------------------------------------------------------------- Vitals Details Patient Name: Date of Service: [name] 08/24/2021 8:30 A M Medical Record Number: [id number] Patient Account Number: [id number] Date of Birth/Sex: Treating RN: 09-25-1934 (87 y.o. Melissa Randolph Primary Care Shasha Buchbinder: Antony Contras Other Clinician: Referring Mateo Overbeck: Treating Jarelyn Bambach/Extender: Anola Gurney in Treatment: 9 Vital Signs Time Taken: 08:44 Temperature (F): 98.1 Weight (lbs): 118 Pulse (bpm): 105 Respiratory Rate (breaths/min): 18 Blood Pressure (mmHg): 149/80 Reference Range: 80 - 120 mg / dl Electronic Signature(s) Signed: 08/24/2021 11:18:51 AM By: Sharyn Creamer RN, BSN Entered By: Sharyn Creamer on 08/24/2021 08:44:48

## 2021-08-24 NOTE — Progress Notes (Signed)
NATHIFA, RITTHALER ([id number]) Visit Report for 08/24/2021 SuperBill Details Patient Name: Date of Service: Melissa Randolph 08/24/2021 Medical Record Number: [id number] Patient Account Number: [id number] Date of Birth/Sex: Treating RN: 02/12/1935 (87 y.o. 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 T79.8XXA Other early complications of trauma, initial encounter I48.20 Chronic atrial fibrillation, unspecified Z79.01 Long term (current) use of anticoagulants I10 Essential (primary) hypertension I87.2 Venous insufficiency (chronic) (peripheral) Facility Procedures CPT4 Code Description Modifier Quantity [id number] (Facility Use Only) [phone] - APPLY MULTLAY COMPRS LWR RT LEG 1 ICD-10 Diagnosis Description L97.912 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

## 2021-08-31 ENCOUNTER — Encounter (HOSPITAL_BASED_OUTPATIENT_CLINIC_OR_DEPARTMENT_OTHER): Payer: Medicare Other | Admitting: Internal Medicine

## 2021-08-31 DIAGNOSIS — Z7901 Long term (current) use of anticoagulants: Secondary | ICD-10-CM | POA: Diagnosis not present

## 2021-08-31 DIAGNOSIS — L97912 Non-pressure chronic ulcer of unspecified part of right lower leg with fat layer exposed: Secondary | ICD-10-CM

## 2021-08-31 DIAGNOSIS — I10 Essential (primary) hypertension: Secondary | ICD-10-CM | POA: Diagnosis not present

## 2021-08-31 DIAGNOSIS — I482 Chronic atrial fibrillation, unspecified: Secondary | ICD-10-CM | POA: Diagnosis not present

## 2021-08-31 DIAGNOSIS — T798XXA Other early complications of trauma, initial encounter: Secondary | ICD-10-CM | POA: Diagnosis not present

## 2021-08-31 DIAGNOSIS — I872 Venous insufficiency (chronic) (peripheral): Secondary | ICD-10-CM | POA: Diagnosis not present

## 2021-08-31 NOTE — Progress Notes (Signed)
Melissa, YEPEZ ([id number]) Visit Report for 08/31/2021 Arrival Information Details Patient Name: Date of Service: Melissa Randolph 08/31/2021 8:45 A M Medical Record Number: [id number] Patient Account Number: [id number] Date of Birth/Sex: Treating RN: 05-15-1934 (87 y.o. [name] Primary Care Burnadette Baskett: Melissa Randolph Other Clinician: Referring Nyssa Sayegh: Treating Saryah Loper/Extender: Melissa Randolph in Treatment: 10 Visit Information History Since Last Visit Added or deleted any medications: No Patient Arrived: Ambulatory Any new allergies or adverse reactions: No Arrival Time: 09:04 Had a fall or experienced change in No Transfer Assistance: None activities of daily living that may affect Patient Identification Verified: Yes risk of falls: Secondary Verification Process Completed: Yes Signs or symptoms of abuse/neglect since last visito No Patient Requires Transmission-Based Precautions: No Hospitalized since last visit: No Patient Has Alerts: Yes Implantable device outside of the clinic excluding No Patient Alerts: Patient on Blood Thinner cellular tissue based products placed in the center Eliquis since last visit: Has Dressing in Place as Prescribed: Yes Has Compression in Place as Prescribed: Yes Pain Present Now: No Electronic Signature(s) Signed: 08/31/2021 4:39:30 PM By: Melissa Randolph Entered By: Melissa Randolph on 08/31/2021 09:05:14 -------------------------------------------------------------------------------- Compression Therapy Details Patient Name: Date of Service: Melissa Leach C. 08/31/2021 8:45 A M Medical Record Number: [id number] Patient Account Number: [id number] Date of Birth/Sex: Treating RN: [birth year] (87 y.o. [name] Primary Care Melissa Randolph: Melissa Randolph Other Clinician: Referring Joncarlo Friberg: Treating Melissa Randolph/Extender: Melissa Randolph in Treatment: 10 Compression Therapy  Performed for Wound Assessment: Wound #1 Right,Anterior Lower Leg Performed By: Clinician Melissa Jackson, RN Compression Type: Three Layer Post Procedure Diagnosis Same as Pre-procedure Electronic Signature(s) Signed: 08/31/2021 4:39:30 PM By: Melissa Randolph Entered By: Melissa Randolph on 08/31/2021 09:32:39 -------------------------------------------------------------------------------- Encounter Discharge Information Details Patient Name: Date of Service: Melissa Leach C. 08/31/2021 8:45 A M Medical Record Number: [id number] Patient Account Number: [id number] Date of Birth/Sex: Treating RN: 1934-08-29 (87 y.o. [name] Primary Care Melissa Randolph: Melissa Randolph Other Clinician: Referring Kiriana Worthington: Treating Melissa Randolph/Extender: Melissa Randolph in Treatment: 10 Encounter Discharge Information Items Post Procedure Vitals Discharge Condition: Stable Temperature (F): 97.9 Ambulatory Status: Ambulatory Pulse (bpm): 91 Discharge Destination: Home Respiratory Rate (breaths/min): 18 Transportation: Private Auto Blood Pressure (mmHg): 133/75 Schedule Follow-up Appointment: Yes Clinical Summary of Care: Provided on 08/31/2021 Form Type Recipient Paper Patient Patient Electronic Signature(s) Signed: 08/31/2021 4:39:30 PM By: Melissa Randolph Entered By: Melissa Randolph on 08/31/2021 09:37:26 -------------------------------------------------------------------------------- Lower Extremity Assessment Details Patient Name: Date of Service: Melissa Randolph 08/31/2021 8:45 A M Medical Record Number: [id number] Patient Account Number: [id number] Date of Birth/Sex: Treating RN: November 16, 1934 (87 y.o. [name] Primary Care Melissa Randolph: Melissa Randolph Other Clinician: Referring Melissa Randolph: Treating Melissa Randolph/Extender: Melissa Randolph in Treatment: 10 Edema Assessment Assessed: Melissa Randolph: No] Melissa Randolph: Yes] Edema: [Left: N] [Right:  o] Calf Left: Right: Point of Measurement: 28 cm From Medial Instep 321.5 cm Ankle Left: Right: Point of Measurement: 10 cm From Medial Instep 19.2 cm Vascular Assessment Pulses: Dorsalis Pedis Palpable: [Right:Yes] Electronic Signature(s) Signed: 08/31/2021 4:39:30 PM By: Melissa Randolph Entered By: Melissa Randolph on 08/31/2021 09:14:39 -------------------------------------------------------------------------------- Multi Wound Chart Details Patient Name: Date of Service: Melissa Leach C. 08/31/2021 8:45 A M Medical Record Number: [id number] Patient Account Number: [id number] Date of Birth/Sex: Treating RN: 1934-05-16 (87 y.o. [name] Primary Care Christena Sunderlin: Melissa Randolph Other Clinician: Referring Kc Sedlak: Treating Jayde Daffin/Extender: Melissa Randolph in Treatment: 10  Vital Signs Height(in): Pulse(bpm): 91 Weight(lbs): 118 Blood Pressure(mmHg): 133/75 Body Mass Index(BMI): Temperature(F): 97.9 Respiratory Rate(breaths/min): 18 Photos: [N/A:N/A] Right, Anterior Lower Leg N/A N/A Wound Location: Trauma N/A N/A Wounding Event: Trauma, Other N/A N/A Primary Etiology: Asthma, Arrhythmia, Hypertension N/A N/A Comorbid History: 06/04/2021 N/A N/A Date Acquired: 10 N/A N/A Weeks of Treatment: Open N/A N/A Wound Status: No N/A N/A Wound Recurrence: 1.6x2x0.1 N/A N/A Measurements L x W x D (cm) 2.513 N/A N/A A (cm) : rea 0.251 N/A N/A Volume (cm) : 97.40% N/A N/A % Reduction in A rea: 97.40% N/A N/A % Reduction in Volume: Full Thickness Without Exposed N/A N/A Classification: Support Structures Medium N/A N/A Exudate A mount: Serosanguineous N/A N/A Exudate Type: red, brown N/A N/A Exudate Color: Distinct, outline attached N/A N/A Wound Margin: Large (67-100%) N/A N/A Granulation A mount: Red, Pink N/A N/A Granulation Quality: Small (1-33%) N/A N/A Necrotic A mount: Fat Layer (Subcutaneous Tissue): Yes N/A  N/A Exposed Structures: Fascia: No Tendon: No Muscle: No Joint: No Bone: No Large (67-100%) N/A N/A Epithelialization: Debridement - Excisional N/A N/A Debridement: Pre-procedure Verification/Time Out 09:27 N/A N/A Taken: Lidocaine 4% Topical Solution N/A N/A Pain Control: Subcutaneous, Slough N/A N/A Tissue Debrided: Skin/Subcutaneous Tissue N/A N/A Level: 3.2 N/A N/A Debridement A (sq cm): rea Curette N/A N/A Instrument: Minimum N/A N/A Bleeding: Pressure N/A N/A Hemostasis Achieved: Procedure was tolerated well N/A N/A Debridement Treatment Response: 1.6x2x0.1 N/A N/A Post Debridement Measurements L x W x D (cm) 0.251 N/A N/A Post Debridement Volume: (cm) Compression Therapy N/A N/A Procedures Performed: Debridement Treatment Notes Wound #1 (Lower Leg) Wound Laterality: Right, Anterior Cleanser Soap and Water Discharge Instruction: May shower and wash wound with dial antibacterial soap and water prior to dressing change. Byram Ancillary Kit - 15 Day Supply Discharge Instruction: Use supplies as instructed; Kit contains: (15) Saline Bullets; (15) 3x3 Gauze; 15 pr Gloves Peri-Wound Care Sween Lotion (Moisturizing lotion) Discharge Instruction: Apply moisturizing lotion as directed Topical Primary Dressing Hydrofera Blue Ready Foam, 4x5 in Discharge Instruction: Apply to wound bed as instructed Secondary Dressing ABD Pad, 8x10 Discharge Instruction: Apply over primary dressing as directed. Secured With Compression Wrap ThreePress (3 layer compression wrap) Discharge Instruction: Apply three layer compression as directed. Compression Stockings Add-Ons Electronic Signature(s) Signed: 08/31/2021 10:30:14 AM By: Kalman Shan DO Signed: 08/31/2021 4:39:30 PM By: Fara Chute By: Kalman Shan on 08/31/2021 10:02:14 -------------------------------------------------------------------------------- Multi-Disciplinary Care Plan Details Patient  Name: Date of Service: Melissa Leach C. 08/31/2021 8:45 A M Medical Record Number: [id number] Patient Account Number: [id number] Date of Birth/Sex: Treating RN: [birth year] (87 y.o. [name] Primary Care Janis Cuffe: Melissa Randolph Other Clinician: Referring Keywon Mestre: Treating Tayia Stonesifer/Extender: Melissa Randolph in Treatment: 10 Active Inactive Wound/Skin Impairment Nursing Diagnoses: Impaired tissue integrity Goals: Patient/caregiver will verbalize understanding of skin care regimen Date Initiated: 06/22/2021 Date Inactivated: 07/06/2021 Target Resolution Date: 07/25/2021 Goal Status: Met Ulcer/skin breakdown will have a volume reduction of 30% by week 4 Date Initiated: 06/22/2021 Date Inactivated: 08/31/2021 Target Resolution Date: 08/22/2021 Goal Status: Met Ulcer/skin breakdown will have a volume reduction of 50% by week 8 Date Initiated: 08/31/2021 Target Resolution Date: 09/14/2021 Goal Status: Active Interventions: Assess ulceration(s) every visit Provide education on ulcer and skin care Notes: Electronic Signature(s) Signed: 08/31/2021 4:39:30 PM By: Melissa Randolph Entered By: Melissa Randolph on 08/31/2021 09:04:36 -------------------------------------------------------------------------------- Pain Assessment Details Patient Name: Date of Service: Melissa Leach C. 08/31/2021 8:45 A M Medical Record Number: [id number]  Patient Account Number: [id number] Date of Birth/Sex: Treating RN: 12-23-1934 (87 y.o. [name] Primary Care Onita Pfluger: Melissa Randolph Other Clinician: Referring Negar Sieler: Treating Kyanna Mahrt/Extender: Melissa Randolph in Treatment: 10 Active Problems Location of Pain Severity and Description of Pain Patient Has Paino No Site Locations Pain Management and Medication Current Pain Management: Electronic Signature(s) Signed: 08/31/2021 4:39:30 PM By: Melissa Randolph Entered By: Melissa Randolph on  08/31/2021 09:07:58 -------------------------------------------------------------------------------- Patient/Caregiver Education Details Patient Name: Date of Service: Melissa Randolph 7/10/2023andnbsp8:45 [place] Record Number: [id number] Patient Account Number: [id number] Date of Birth/Gender: Treating RN: Feb 25, 1934 (87 y.o. [name] Primary Care Physician: Melissa Randolph Other Clinician: Referring Physician: Treating Physician/Extender: Melissa Randolph in Treatment: 10 Education Assessment Education Provided To: Patient Education Topics Provided Venous: Methods: Explain/Verbal, Printed Responses: State content correctly Wound/Skin Impairment: Methods: Explain/Verbal, Printed Responses: State content correctly Electronic Signature(s) Signed: 08/31/2021 4:39:30 PM By: Melissa Randolph Entered By: Melissa Randolph on 08/31/2021 09:04:55 -------------------------------------------------------------------------------- Wound Assessment Details Patient Name: Date of Service: Melissa Randolph 08/31/2021 8:45 A M Medical Record Number: [id number] Patient Account Number: [id number] Date of Birth/Sex: Treating RN: 31-Mar-1934 (87 y.o. [name] Primary Care Addalyne Vandehei: Melissa Randolph Other Clinician: Referring Lakoda Mcanany: Treating Delsin Copen/Extender: Melissa Randolph in Treatment: 10 Wound Status Wound Number: 1 Primary Etiology: Trauma, Other Wound Location: Right, Anterior Lower Leg Wound Status: Open Wounding Event: Trauma Comorbid History: Asthma, Arrhythmia, Hypertension Date Acquired: 06/04/2021 Weeks Of Treatment: 10 Clustered Wound: No Photos Wound Measurements Length: (cm) 1.6 Width: (cm) 2 Depth: (cm) 0.1 Area: (cm) 2.513 Volume: (cm) 0.251 % Reduction in Area: 97.4% % Reduction in Volume: 97.4% Epithelialization: Large (67-100%) Tunneling: No Undermining: No Wound  Description Classification: Full Thickness Without Exposed Support Structures Wound Margin: Distinct, outline attached Exudate Amount: Medium Exudate Type: Serosanguineous Exudate Color: red, brown Foul Odor After Cleansing: No Slough/Fibrino Yes Wound Bed Granulation Amount: Large (67-100%) Exposed Structure Granulation Quality: Red, Pink Fascia Exposed: No Necrotic Amount: Small (1-33%) Fat Layer (Subcutaneous Tissue) Exposed: Yes Necrotic Quality: Adherent Slough Tendon Exposed: No Muscle Exposed: No Joint Exposed: No Bone Exposed: No Treatment Notes Wound #1 (Lower Leg) Wound Laterality: Right, Anterior Cleanser Soap and Water Discharge Instruction: May shower and wash wound with dial antibacterial soap and water prior to dressing change. Byram Ancillary Kit - 15 Day Supply Discharge Instruction: Use supplies as instructed; Kit contains: (15) Saline Bullets; (15) 3x3 Gauze; 15 pr Gloves Peri-Wound Care Sween Lotion (Moisturizing lotion) Discharge Instruction: Apply moisturizing lotion as directed Topical Primary Dressing Hydrofera Blue Ready Foam, 4x5 in Discharge Instruction: Apply to wound bed as instructed Secondary Dressing ABD Pad, 8x10 Discharge Instruction: Apply over primary dressing as directed. Secured With Compression Wrap ThreePress (3 layer compression wrap) Discharge Instruction: Apply three layer compression as directed. Compression Stockings Add-Ons Electronic Signature(s) Signed: 08/31/2021 4:29:33 PM By: Deon Pilling RN, BSN Signed: 08/31/2021 4:39:30 PM By: Melissa Randolph Entered By: Deon Pilling on 08/31/2021 10:02:47 -------------------------------------------------------------------------------- Vitals Details Patient Name: Date of Service: Melissa Leach C. 08/31/2021 8:45 A M Medical Record Number: [id number] Patient Account Number: [id number] Date of Birth/Sex: Treating RN: 1934/07/11 (87 y.o. [name] Primary Care  Nylee Barbuto: Other Clinician: Antony Randolph Referring Darrielle Pflieger: Treating Kourtlyn Charlet/Extender: Melissa Randolph in Treatment: 10 Vital Signs Time Taken: 09:07 Temperature (F): 97.9 Weight (lbs): 118 Pulse (bpm): 91 Respiratory Rate (breaths/min): 18 Blood Pressure (mmHg): 133/75 Reference Range: 80 - 120 mg / dl  Electronic Signature(s) Signed: 08/31/2021 4:39:30 PM By: Melissa Randolph Entered By: Melissa Randolph on 08/31/2021 09:07:49

## 2021-08-31 NOTE — Progress Notes (Signed)
YONA, KOSEK ([id number]) Visit Report for 08/31/2021 Chief Complaint Document Details Patient Name: Date of Service: Melissa Randolph 08/31/2021 8:45 A M Medical Record Number: [id number] Patient Account Number: [id number] Date of Birth/Sex: Treating RN: [birth year] (87 y.o. [name] Primary Care Provider: Antony Contras Other Clinician: Referring Provider: Treating Provider/Extender: Olivia Mackie in Treatment: 10 Information Obtained from: Patient Chief Complaint 06/22/2021; Right lower extremity wound following trauma Electronic Signature(s) Signed: 08/31/2021 10:30:14 AM By: Kalman Shan DO Entered By: Kalman Shan on 08/31/2021 10:02:22 -------------------------------------------------------------------------------- Debridement Details Patient Name: Date of Service: Melissa Leach C. 08/31/2021 8:45 A M Medical Record Number: [id number] Patient Account Number: [id number] Date of Birth/Sex: Treating RN: 05/01/1934 (87 y.o. [name] Primary Care Provider: Antony Contras Other Clinician: Referring Provider: Treating Provider/Extender: Olivia Mackie in Treatment: 10 Debridement Performed for Assessment: Wound #1 Right,Anterior Lower Leg Performed By: Physician Kalman Shan, DO Debridement Type: Debridement Level of Consciousness (Pre-procedure): Awake and Alert Pre-procedure Verification/Time Out Yes - 09:27 Taken: Start Time: 09:28 Pain Control: Lidocaine 4% T opical Solution T Area Debrided (L x W): otal 1.6 (cm) x 2 (cm) = 3.2 (cm) Tissue and other material debrided: Non-Viable, Slough, Subcutaneous, Slough Level: Skin/Subcutaneous Tissue Debridement Description: Excisional Instrument: Curette Bleeding: Minimum Hemostasis Achieved: Pressure End Time: 09:34 Response to Treatment: Procedure was tolerated well Level of Consciousness (Post- Awake and Alert procedure): Post Debridement  Measurements of Total Wound Length: (cm) 1.6 Width: (cm) 2 Depth: (cm) 0.1 Volume: (cm) 0.251 Character of Wound/Ulcer Post Debridement: Stable Post Procedure Diagnosis Same as Pre-procedure Electronic Signature(s) Signed: 08/31/2021 10:30:14 AM By: Kalman Shan DO Signed: 08/31/2021 4:39:30 PM By: Lorrin Jackson Entered By: Lorrin Jackson on 08/31/2021 09:34:21 -------------------------------------------------------------------------------- HPI Details Patient Name: Date of Service: Melissa Bryant [name] 08/31/2021 8:45 A M Medical Record Number: [id number] Patient Account Number: [id number] Date of Birth/Sex: Treating RN: 30-Aug-1934 (87 y.o. [name] Primary Care Provider: Antony Contras Other Clinician: Referring Provider: Treating Provider/Extender: Olivia Mackie in Treatment: 10 History of Present Illness HPI Description: Admission 06/22/2021 Ms. Orvilla Truett is an 87 year old female with a past medical history of A-fib on Eliquis that presents to the clinic for a wound to her right lower extremity. She fell over a wooden baseboard on 4/13. She went to the ED for evaluation and she had 15 stitches placed. She had an x-ray of her leg that showed no acute osseous abnormality. She has been placing Xeroform over the wound bed intermittently. She visited the ED again on 4/21 because of wound infection. She was started on doxycycline and cefadroxil. She states she has a couple days remaining. She currently denies systemic signs of infection. 5/8; patient presents for follow-up. She has been using bacitracin and Xeroform to the wound bed. She reports significant improvement in her pain to the wound bed. She states she is using her compression stockings daily. She denies signs of infection. 5/15; patient presents for follow-up. She has been using Iodoflex to the wound bed. She has no issues or complaints today. She has been using her compression stocking  daily. She went to vein and vascular to have her ABIs with TBI's completed. On the right she had an ABI of 1.17 and TBI of 0.68. 5/22; patient presents for follow-up. We switched to [facility] and antibiotic ointment under a compression wrap at last clinic visit. She tolerated the wrap well. She has no issues or complaints today.  She denies signs of infection. 5/30; patient presents for follow-up. We have been using Hydrofera Blue and antibiotic under compression. The Hydrofera Blue was stuck on tightly and had moved out of place under the wrap. She currently denies signs of infection. She is tolerating the compression wrap well. 6/6; patient changed to collagen last week also using Santyl. Dimensions are a lot better this week. She was also put under compression last week with 3 layer 6/13; patient presents for follow-up. We have been using collagen and Santyl under 3 layer compression. She continues to tolerate this well. She has no issues or complaints today. She denies signs of infection. 6/19; patient presents for follow-up. We have been using collagen and Santyl under 3 layer compression. She has no issues or complaints today. 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 She has been more active. She currently denies signs of infection. 6/26; patient presents for follow-up. She was switched to PolyMem silver under 3 layer compression at last clinic visit. There is slightly more maceration to the periwound however the wound itself looks improved. She has no issues or complaints today. 7/10; patient presents for follow-up. We have been using Hydrofera Blue under 3 layer compression. She has no issues or complaints today. She had a nurse visit at last clinic visit for wrap change. Electronic Signature(s) Signed: 08/31/2021 10:30:14 AM By: Kalman Shan DO Entered By: Kalman Shan on 08/31/2021  10:03:03 -------------------------------------------------------------------------------- Physical Exam Details Patient Name: Date of Service: Melissa Randolph 08/31/2021 8:45 A M Medical Record Number: [id number] Patient Account Number: [id number] Date of Birth/Sex: Treating RN: 1934-07-17 (87 y.o. [name] Primary Care Provider: Antony Contras Other Clinician: Referring Provider: Treating Provider/Extender: Olivia Mackie in Treatment: 10 Constitutional respirations regular, non-labored and within target range for patient.. Cardiovascular 2+ dorsalis pedis/posterior tibialis pulses. Psychiatric pleasant and cooperative. Notes Right lower extremity: Open wound to the anterior aspect with granulation tissue and nonviable tissue. No signs of surrounding infection. Good edema control. No maceration to the periwound. Electronic Signature(s) Signed: 08/31/2021 10:30:14 AM By: Kalman Shan DO Entered By: Kalman Shan on 08/31/2021 10:03:31 -------------------------------------------------------------------------------- Physician Orders Details Patient Name: Date of Service: Melissa Leach C. 08/31/2021 8:45 A M Medical Record Number: [id number] Patient Account Number: [id number] Date of Birth/Sex: Treating RN: Aug 22, 1934 (87 y.o. 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 - Dr. [facility] and Leveda Anna (Room # 7) 09/07/2021 @ 8:45am Return A Bathing/ Shower/ Hygiene May shower with protection but do not get wound dressing(s) wet. - purchase cast protector from [facility], [facility], [employer] Edema Control - Lymphedema / SCD / Other Elevate legs to the level of the heart or above for 30 minutes daily and/or when sitting, a frequency of: - 2-3 times per day void standing for long periods of time. - limit time standing still, sit when possible, wear compression stockings during day A Exercise regularly - light walking Wound Treatment Wound #1 - Lower Leg Wound Laterality: Right, Anterior Cleanser: Soap and  Water 1 x Per Week/30 Days Discharge Instructions: May shower and wash wound with dial antibacterial soap and water prior to dressing change. Cleanser: Byram Ancillary Kit - 15 Day Supply (Generic) 1 x Per Week/30 Days Discharge Instructions: Use supplies as instructed; Kit contains: (15) Saline Bullets; (15) 3x3 Gauze; 15 pr Gloves Peri-Wound Care: Sween Lotion (Moisturizing lotion) 1 x Per Week/30 Days Discharge Instructions: Apply moisturizing lotion as directed Prim Dressing: Hydrofera Blue Ready Foam, 4x5 in 1 x Per Week/30 Days ary Discharge Instructions: Apply to wound bed as instructed Secondary Dressing: ABD Pad, 8x10 1 x Per Week/30 Days Discharge Instructions: Apply over primary dressing as directed. Compression Wrap: ThreePress (3 layer compression wrap) 1 x Per Week/30 Days Discharge Instructions: Apply three layer compression as directed. Electronic Signature(s) Signed: 08/31/2021 10:30:14 AM By: Kalman Shan DO Entered By: Kalman Shan on 08/31/2021 10:03:38 -------------------------------------------------------------------------------- Problem List Details Patient Name: Date of Service: Melissa Leach C. 08/31/2021 8:45 A M Medical Record Number: [id number] Patient Account Number: [id number] Date of  Birth/Sex: Treating RN: 19-Aug-1934 (87 y.o. [name] Primary Care Provider: Antony Contras Other Clinician: Referring Provider: Treating Provider/Extender: Olivia Mackie in Treatment: 10 Active Problems ICD-10 Encounter Code Description Active Date MDM Diagnosis [id number] Non-pressure chronic ulcer of unspecified part of right lower leg with fat layer 06/22/2021 No Yes exposed T79.8XXA Other early complications of trauma, initial encounter 06/22/2021 No Yes I48.20 Chronic atrial fibrillation, unspecified 06/22/2021 No Yes Z79.01 Long term (current) use of anticoagulants 06/22/2021 No Yes I10 Essential (primary) hypertension 06/22/2021 No Yes I87.2 Venous insufficiency (chronic) (peripheral) 07/06/2021 No Yes Inactive Problems Resolved Problems Electronic Signature(s) Signed: 08/31/2021 10:30:14 AM By: Kalman Shan DO Entered By: Kalman Shan on 08/31/2021 10:02:03 -------------------------------------------------------------------------------- Progress Note Details Patient Name: Date of Service: Melissa Leach C. 08/31/2021 8:45 A M Medical Record Number: [id number] Patient Account Number: [id number] Date of Birth/Sex: Treating RN: Mar 11, 1934 (87 y.o. [name] Primary Care Provider: Antony Contras Other Clinician: Referring Provider: Treating Provider/Extender: Olivia Mackie in Treatment: 10 Subjective Chief Complaint Information obtained from Patient 06/22/2021; Right lower extremity wound following trauma History of Present Illness (HPI) Admission 06/22/2021 [name] is an 87 year old female with a past medical history of A-fib on Eliquis that presents to the clinic for a wound to her right lower extremity. She fell over a wooden baseboard on 4/13. She went to the ED for evaluation and she had 15 stitches placed. She had an x-ray of her leg that showed no acute osseous abnormality. She has been placing  Xeroform over the wound bed intermittently. She visited the ED again on 4/21 because of wound infection. She was started on doxycycline and cefadroxil. She states she has a couple days remaining. She currently denies systemic signs of infection. 5/8; patient presents for follow-up. She has been using bacitracin and Xeroform to the wound bed. She reports significant improvement in her pain to the wound bed. She states she is using her compression stockings daily. She denies signs of infection. 5/15; patient presents for follow-up. She has been using Iodoflex to the wound bed. She has no issues or complaints today. She has been using her compression stocking daily. She went to vein and vascular to have her ABIs with TBI's completed. On the right she had an ABI of 1.17 and TBI of 0.68. 5/22; patient presents for follow-up. We switched to [facility] and antibiotic ointment under a compression wrap at last clinic visit.  She tolerated the wrap well. She has no issues or complaints today. She denies signs of infection. 5/30; patient presents for follow-up. We have been using Hydrofera Blue and antibiotic under compression. The Hydrofera Blue was stuck on tightly and had moved out of place under the wrap. She currently denies signs of infection. She is tolerating the compression wrap well. 6/6; patient changed to collagen last week also using Santyl. Dimensions are a lot better this week. She was also put under compression last week with 3 layer 6/13; patient presents for follow-up. We have been using collagen and Santyl under 3 layer compression. She continues to tolerate this well. She has no issues or complaints today. She denies signs of infection. 6/19; patient presents for follow-up. We have been using collagen and Santyl under 3 layer compression. She has no issues or complaints today. 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 She has been more active. She  currently denies signs of infection. 6/26; patient presents for follow-up. She was switched to PolyMem silver under 3 layer compression at last clinic visit. There is slightly more maceration to the periwound however the wound itself looks improved. She has no issues or complaints today. 7/10; patient presents for follow-up. We have been using Hydrofera Blue under 3 layer compression. She has no issues or complaints today. She had a nurse visit at last clinic visit for wrap change. Patient History Information obtained from Patient. Family History Cancer - Father, Heart Disease - Father, Hypertension - Father, No family history of Diabetes, Kidney Disease, Lung Disease, Seizures, Stroke, Thyroid Problems, Tuberculosis. Social History Never smoker, Marital Status - Married, Alcohol Use - Never, Drug Use - No History, Caffeine Use - Never. Medical History Respiratory Patient has history of Asthma Cardiovascular Patient has history of Arrhythmia - A-fib, Hypertension Psychiatric Denies history of Anorexia/bulimia, Confinement Anxiety Hospitalization/Surgery History - breast surgery 8 years ago. Medical A Surgical History Notes nd Eyes cataract surgery both eyes Cardiovascular hypercholesterolemia Objective Constitutional respirations regular, non-labored and within target range for patient.. Vitals Time Taken: 9:07 AM, Weight: 118 lbs, Temperature: 97.9 F, Pulse: 91 bpm, Respiratory Rate: 18 breaths/min, Blood Pressure: 133/75 mmHg. Cardiovascular 2+ dorsalis pedis/posterior tibialis pulses. Psychiatric pleasant and cooperative. General Notes: Right lower extremity: Open wound to the anterior aspect with granulation tissue and nonviable tissue. No signs of surrounding infection. Good edema control. No maceration to the periwound. Integumentary (Hair, Skin) Wound #1 status is Open. Original cause of wound was Trauma. The date acquired was: 06/04/2021. The wound has been in treatment  10 weeks. The wound is located on the Right,Anterior Lower Leg. The wound measures 1.6cm length x 2cm width x 0.1cm depth; 2.513cm^2 area and 0.251cm^3 volume. There is Fat Layer (Subcutaneous Tissue) exposed. There is no tunneling or undermining noted. There is a medium amount of serosanguineous drainage noted. The wound margin is distinct with the outline attached to the wound base. There is large (67-100%) red, pink granulation within the wound bed. There is a small (1-33%) amount of necrotic tissue within the wound bed including Adherent Slough. 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 I debrided nonviable tissue. I recommended continuing the course with Hydrofera  Blue under 3 layer compression. Follow-up in 1 week. Procedures Wound #1 Pre-procedure diagnosis of Wound #1 is a Trauma, Other located on the Right,Anterior Lower Leg . There was a Excisional Skin/Subcutaneous Tissue Debridement with a total area of 3.2 sq cm performed by Kalman Shan, DO. With the following instrument(s): Curette to remove Non-Viable tissue/material. Material removed includes Subcutaneous Tissue and Slough and after achieving pain control using Lidocaine 4% T opical Solution. No specimens were taken. A time out was conducted at 09:27, prior to the start of the procedure. A Minimum amount of bleeding was controlled with Pressure. The procedure was tolerated well. Post Debridement Measurements: 1.6cm length x 2cm width x 0.1cm depth; 0.251cm^3 volume. Character of Wound/Ulcer Post Debridement is stable. Post procedure Diagnosis  Wound #1: Same as Pre-Procedure Pre-procedure diagnosis of Wound #1 is a Trauma, Other located on the Right,Anterior Lower Leg . There was a Three Layer Compression Therapy Procedure by Lorrin Jackson, RN. Post procedure Diagnosis Wound #1: Same as Pre-Procedure Plan Follow-up Appointments: Return Appointment in 1 week. - Dr. Heber [facility] and Leveda Anna (Room # 7) 09/07/2021 @ 8:45am Bathing/ Shower/ Hygiene: May shower with protection but do not get wound dressing(s) wet. - purchase cast protector from [facility], [facility], [place] Edema Control - Lymphedema / SCD / Other: Elevate legs to the level of the heart or above for 30 minutes daily and/or when sitting, a frequency of: - 2-3 times per day Avoid standing for long periods of time. - limit time standing still, sit when possible, wear compression stockings during day Exercise regularly - light walking WOUND #1: - Lower Leg Wound Laterality: Right, Anterior Cleanser: Soap and Water 1 x Per Week/30 Days Discharge Instructions: May shower and wash wound with dial antibacterial soap and water prior to dressing change. Cleanser: Byram Ancillary Kit - 15 Day Supply (Generic) 1 x Per Week/30 Days Discharge Instructions: Use supplies as instructed; Kit contains: (15) Saline Bullets; (15) 3x3 Gauze; 15 pr Gloves Peri-Wound Care: Sween Lotion (Moisturizing lotion) 1 x Per Week/30 Days Discharge Instructions: Apply moisturizing lotion as directed Prim Dressing: Hydrofera Blue Ready Foam, 4x5 in 1 x Per Week/30 Days ary Discharge Instructions: Apply to wound bed as instructed Secondary Dressing: ABD Pad, 8x10 1 x Per Week/30 Days Discharge Instructions: Apply over primary dressing as directed. Compression Wrap: ThreePress (3 layer compression wrap) 1 x Per Week/30 Days Discharge Instructions: Apply three layer compression as directed. 1. In office sharp debridement 2. Hydrofera Blue under 3 layer compression 3. Follow-up in 1 week Electronic  Signature(s) Signed: 08/31/2021 10:30:14 AM By: Kalman Shan DO Entered By: Kalman Shan on 08/31/2021 10:04:06 -------------------------------------------------------------------------------- HxROS Details Patient Name: Date of Service: Melissa Leach C. 08/31/2021 8:45 A M Medical Record Number: [id number] Patient Account Number: [id number] Date of Birth/Sex: Treating RN: [birth year] (87 y.o. [name] Primary Care Provider: Antony Contras Other Clinician: Referring Provider: Treating Provider/Extender: Olivia Mackie in Treatment: 10 Information Obtained From Patient Eyes Medical History: Past Medical History Notes: cataract surgery both eyes Respiratory Medical History: Positive for: Asthma Cardiovascular Medical History: Positive for: Arrhythmia - A-fib; Hypertension Past Medical History Notes: hypercholesterolemia Psychiatric Medical History: Negative for: Anorexia/bulimia; Confinement Anxiety Immunizations Pneumococcal Vaccine: Received Pneumococcal Vaccination: Yes Received Pneumococcal Vaccination On or After 60th Birthday: Yes Implantable Devices None Hospitalization / Surgery History Type of Hospitalization/Surgery breast surgery 8 years ago Family and Social History Cancer: Yes - Father; Diabetes: No; Heart Disease: Yes - Father; Hypertension: Yes - Father; Kidney Disease:  No; Lung Disease: No; Seizures: No; Stroke: No; Thyroid Problems: No; Tuberculosis: No; Never smoker; Marital Status - Married; Alcohol Use: Never; Drug Use: No History; Caffeine Use: Never; Financial Concerns: No; Food, Clothing or Shelter Needs: No; Support System Lacking: No; Transportation Concerns: No Electronic Signature(s) Signed: 08/31/2021 10:30:14 AM By: Kalman Shan DO Signed: 08/31/2021 4:39:30 PM By: Lorrin Jackson Entered By: Kalman Shan on 08/31/2021  10:03:07 -------------------------------------------------------------------------------- SuperBill Details Patient Name: Date of Service: Melissa Randolph 08/31/2021 Medical Record Number: [id number] Patient Account Number: [id number] Date of Birth/Sex: Treating RN: 05-12-1934 (87 y.o. [name] Primary Care Provider: Antony Contras Other Clinician: Referring Provider: Treating Provider/Extender: Olivia Mackie in Treatment: 10 Diagnosis Coding ICD-10 Codes Code Description [id number] Non-pressure chronic ulcer of unspecified part of right lower leg with fat layer exposed T79.8XXA Other early complications of trauma, initial encounter I48.20 Chronic atrial fibrillation, unspecified Z79.01 Long term (current) use of anticoagulants I10 Essential (primary) hypertension I87.2 Venous insufficiency (chronic) (peripheral) Facility Procedures CPT4 Code: [id number] Description: 81840 - DEB SUBQ TISSUE 20 SQ CM/< ICD-10 Diagnosis Description R75.436 Non-pressure chronic ulcer of unspecified part of right lower leg with fat lay Modifier: er exposed Quantity: 1 Physician Procedures : CPT4 Code Description Modifier [id number] 11042 - WC PHYS SUBQ TISS 20 SQ CM ICD-10 Diagnosis Description K35.248 Non-pressure chronic ulcer of unspecified part of right lower leg with fat layer exposed Quantity: 1 Electronic Signature(s) Signed: 08/31/2021 10:30:14 AM By: Kalman Shan DO Entered By: Kalman Shan on 08/31/2021 10:04:17

## 2021-09-07 ENCOUNTER — Encounter (HOSPITAL_BASED_OUTPATIENT_CLINIC_OR_DEPARTMENT_OTHER): Payer: Medicare Other | Admitting: Internal Medicine

## 2021-09-07 DIAGNOSIS — Z7901 Long term (current) use of anticoagulants: Secondary | ICD-10-CM | POA: Diagnosis not present

## 2021-09-07 DIAGNOSIS — T798XXA Other early complications of trauma, initial encounter: Secondary | ICD-10-CM | POA: Diagnosis not present

## 2021-09-07 DIAGNOSIS — L97912 Non-pressure chronic ulcer of unspecified part of right lower leg with fat layer exposed: Secondary | ICD-10-CM | POA: Diagnosis not present

## 2021-09-07 DIAGNOSIS — I482 Chronic atrial fibrillation, unspecified: Secondary | ICD-10-CM | POA: Diagnosis not present

## 2021-09-07 DIAGNOSIS — I10 Essential (primary) hypertension: Secondary | ICD-10-CM | POA: Diagnosis not present

## 2021-09-07 DIAGNOSIS — I872 Venous insufficiency (chronic) (peripheral): Secondary | ICD-10-CM | POA: Diagnosis not present

## 2021-09-07 NOTE — Progress Notes (Signed)
Melissa Randolph ([id number]) Visit Report for 09/07/2021 Chief Complaint Document Details Patient Name: Date of Service: Melissa Randolph 09/07/2021 8:45 A M Medical Record Number: [id number] Patient Account Number: [id number] Date of Birth/Sex: Treating RN: 01-10-1935 (87 y.o. [name] Primary Care Provider: Antony Contras Other Clinician: Referring Provider: Treating Provider/Extender: Olivia Mackie in Treatment: 11 Information Obtained from: Patient Chief Complaint 06/22/2021; Right lower extremity wound following trauma Electronic Signature(s) Signed: 09/07/2021 10:03:27 AM By: Kalman Shan DO Entered By: Kalman Shan on 09/07/2021 09:51:44 -------------------------------------------------------------------------------- Debridement Details Patient Name: Date of Service: Melissa Randolph Melissa C. 09/07/2021 8:45 A M Medical Record Number: [id number] Patient Account Number: [id number] Date of Birth/Sex: Treating RN: [birth year] (87 y.o. [name] Primary Care Provider: Antony Contras Other Clinician: Referring Provider: Treating Provider/Extender: Olivia Mackie in Treatment: 11 Debridement Performed for Assessment: Wound #1 Right,Anterior Lower Leg Performed By: Physician Kalman Shan, DO Debridement Type: Debridement Level of Consciousness (Pre-procedure): Awake and Alert Pre-procedure Verification/Time Out Yes - 09:01 Taken: Start Time: 09:02 T Area Debrided (L x W): otal 1.5 (cm) x 1.7 (cm) = 2.55 (cm) Tissue and other material debrided: Non-Viable, Slough, Subcutaneous, Slough Level: Skin/Subcutaneous Tissue Debridement Description: Excisional Instrument: Curette Bleeding: Minimum Hemostasis Achieved: Pressure End Time: 09:06 Response to Treatment: Procedure was tolerated well Level of Consciousness (Post- Awake and Alert procedure): Post Debridement Measurements of Total Wound Length: (cm)  1.5 Width: (cm) 1.7 Depth: (cm) 0.2 Volume: (cm) 0.401 Character of Wound/Ulcer Post Debridement: Stable Post Procedure Diagnosis Same as Pre-procedure Electronic Signature(s) Signed: 09/07/2021 10:03:27 AM By: Kalman Shan DO Signed: 09/07/2021 3:44:44 PM By: Lorrin Jackson Entered By: Lorrin Jackson on 09/07/2021 09:06:43 -------------------------------------------------------------------------------- HPI Details Patient Name: Date of Service: Melissa Randolph Melissa C. 09/07/2021 8:45 A M Medical Record Number: [id number] Patient Account Number: [id number] Date of Birth/Sex: Treating RN: 09/20/1934 (87 y.o. [name] Primary Care Provider: Antony Contras Other Clinician: Referring Provider: Treating Provider/Extender: Olivia Mackie in Treatment: 11 History of Present Illness HPI Description: Admission 06/22/2021 Ms. Latrelle Fuston is an 87 year old female with a past medical history of A-fib on Eliquis that presents to the clinic for a wound to her right lower extremity. She fell over a wooden baseboard on 4/13. She went to the ED for evaluation and she had 15 stitches placed. She had an x-ray of her leg that showed no acute osseous abnormality. She has been placing Xeroform over the wound bed intermittently. She visited the ED again on 4/21 because of wound infection. She was started on doxycycline and cefadroxil. She states she has a couple days remaining. She currently denies systemic signs of infection. 5/8; patient presents for follow-up. She has been using bacitracin and Xeroform to the wound bed. She reports significant improvement in her pain to the wound bed. She states she is using her compression stockings daily. She denies signs of infection. 5/15; patient presents for follow-up. She has been using Iodoflex to the wound bed. She has no issues or complaints today. She has been using her compression stocking daily. She went to vein and vascular to  have her ABIs with TBI's completed. On the right she had an ABI of 1.17 and TBI of 0.68. 5/22; patient presents for follow-up. We switched to [facility] and antibiotic ointment under a compression wrap at last clinic visit. She tolerated the wrap well. She has no issues or complaints today. She denies signs of infection. 5/30; patient  presents for follow-up. We have been using Hydrofera Blue and antibiotic under compression. The Hydrofera Blue was stuck on tightly and had moved out of place under the wrap. She currently denies signs of infection. She is tolerating the compression wrap well. 6/6; patient changed to collagen last week also using Santyl. Dimensions are a lot better this week. She was also put under compression last week with 3 layer 6/13; patient presents for follow-up. We have been using collagen and Santyl under 3 layer compression. She continues to tolerate this well. She has no issues or complaints today. She denies signs of infection. 6/19; patient presents for follow-up. We have been using collagen and Santyl under 3 layer compression. She has no issues or complaints today. 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 She has been more active. She currently denies signs of infection. 6/26; patient presents for follow-up. She was switched to PolyMem silver under 3 layer compression at last clinic visit. There is slightly more maceration to the periwound however the wound itself looks improved. She has no issues or complaints today. 7/10; patient presents for follow-up. We have been using Hydrofera Blue under 3 layer compression. She has no issues or complaints today. She had a nurse visit at last clinic visit for wrap change. 7/17; patient presents for follow-up. We have been using Hydrofera Blue under 3 layer compression. She has no issues or complaints today. She denies signs of infection. Electronic Signature(s) Signed: 09/07/2021 10:03:27 AM  By: Kalman Shan DO Entered By: Kalman Shan on 09/07/2021 09:52:03 -------------------------------------------------------------------------------- Physical Exam Details Patient Name: Date of Service: Melissa Randolph 09/07/2021 8:45 A M Medical Record Number: [id number] Patient Account Number: [id number] Date of Birth/Sex: Treating RN: August 31, 1934 (87 y.o. [name] Primary Care Provider: Antony Contras Other Clinician: Referring Provider: Treating Provider/Extender: Olivia Mackie in Treatment: 11 Constitutional respirations regular, non-labored and within target range for patient.. Cardiovascular 2+ dorsalis pedis/posterior tibialis pulses. Psychiatric pleasant and cooperative. Notes Right lower extremity: Open wound to the anterior aspect with granulation tissue and nonviable tissue. No signs of surrounding infection. Good edema control. Electronic Signature(s) Signed: 09/07/2021 10:03:27 AM By: Kalman Shan DO Entered By: Kalman Shan on 09/07/2021 09:52:24 -------------------------------------------------------------------------------- Physician Orders Details Patient Name: Date of Service: Melissa Leach C. 09/07/2021 8:45 A M Medical Record Number: [id number] Patient Account Number: [id number] Date of Birth/Sex: Treating RN: [birth year] (87 y.o. 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 - Dr. Heber [place] and Leveda Anna (Room # 7) 09/14/2021 @ 8:45am Return A Bathing/ Shower/ Hygiene May shower with protection but do not get wound dressing(s) wet. - purchase cast protector from [facility], [facility], [employer] Edema Control - Lymphedema / SCD / Other Elevate legs to the level of the heart or above for 30 minutes daily and/or when sitting, a frequency of: - 2-3 times per day void standing for long periods of time. - limit time standing still, sit when possible, wear compression stockings during day A Exercise regularly - light  walking Wound Treatment Wound #1 - Lower Leg Wound Laterality: Right, Anterior Cleanser: Soap and Water 1 x Per Week/30 Days Discharge Instructions: May shower and wash wound with dial antibacterial soap and water prior to dressing change. Peri-Wound Care: Sween Lotion (Moisturizing lotion) 1 x Per Week/30 Days Discharge Instructions: Apply moisturizing lotion as directed Prim Dressing: Hydrofera Blue Ready Foam, 4x5 in 1 x Per Week/30 Days ary Discharge Instructions: Apply to wound bed as instructed Prim Dressing: Santyl Ointment 1 x Per Week/30 Days ary Discharge Instructions: Apply nickel thick amount to wound bed as instructed Secondary Dressing: ABD Pad, 8x10 1 x Per Week/30 Days Discharge Instructions: Apply over primary dressing as directed. Compression Wrap: ThreePress (3 layer compression wrap) 1 x Per Week/30 Days Discharge Instructions: Apply three layer compression as directed. Electronic Signature(s) Signed: 09/07/2021 10:03:27 AM By: Kalman Shan DO Entered By: Kalman Shan on 09/07/2021 09:52:31 -------------------------------------------------------------------------------- Problem List Details Patient Name: Date of Service: Melissa Leach C. 09/07/2021 8:45 A M Medical Record Number: [id number] Patient Account Number: [id number] Date of Birth/Sex: Treating RN: November 10, 1934 (87  y.o. [name] Primary Care Provider: Antony Contras Other Clinician: Referring Provider: Treating Provider/Extender: Olivia Mackie in Treatment: 11 Active Problems ICD-10 Encounter Code Description Active Date MDM Diagnosis [phone] Non-pressure chronic ulcer of unspecified part of right lower leg with fat layer 06/22/2021 No Yes exposed T79.8XXA Other early complications of trauma, initial encounter 06/22/2021 No Yes I48.20 Chronic atrial fibrillation, unspecified 06/22/2021 No Yes Z79.01 Long term (current) use of anticoagulants 06/22/2021 No Yes I10 Essential (primary) hypertension 06/22/2021 No Yes I87.2 Venous insufficiency (chronic) (peripheral) 07/06/2021 No Yes Inactive Problems Resolved Problems Electronic Signature(s) Signed: 09/07/2021 10:03:27 AM By: Kalman Shan DO Previous Signature: 09/07/2021 9:01:00 AM Version By: Lorrin Jackson Entered By: Kalman Shan on 09/07/2021 09:51:31 -------------------------------------------------------------------------------- Progress Note Details Patient Name: Date of Service: Melissa Leach C. 09/07/2021 8:45 A M Medical Record Number: [id number] Patient Account Number: [id number] Date of Birth/Sex: Treating RN: [birth year] (87 y.o. [name] Primary Care Provider: Antony Contras Other Clinician: Referring Provider: Treating Provider/Extender: Olivia Mackie in Treatment: 11 Subjective Chief Complaint Information obtained from Patient 06/22/2021; Right lower extremity wound following trauma History of Present Illness (HPI) Admission 06/22/2021 Ms. Oria Klimas is an 87 year old female with a past medical history of A-fib on Eliquis that presents to the clinic for a wound to her right lower extremity. She fell over a wooden baseboard on 4/13. She went to the ED for evaluation and she had 15 stitches placed. She had an x-ray of her leg that showed no acute osseous  abnormality. She has been placing Xeroform over the wound bed intermittently. She visited the ED again on 4/21 because of wound infection. She was started on doxycycline and cefadroxil. She states she has a couple days remaining. She currently denies systemic signs of infection. 5/8; patient presents for follow-up. She has been using bacitracin and Xeroform to the wound bed. She reports significant improvement in her pain to the wound bed. She states she is using her compression stockings daily. She denies signs of infection. 5/15; patient presents for follow-up. She has been using Iodoflex to the wound bed. She has no issues or complaints today. She has been using her compression stocking daily. She went to vein and vascular to have her ABIs with TBI's completed. On the right she had an ABI of 1.17 and TBI of 0.68. 5/22; patient presents for follow-up. We switched  to [facility] and antibiotic ointment under a compression wrap at last clinic visit. She tolerated the wrap well. She has no issues or complaints today. She denies signs of infection. 5/30; patient presents for follow-up. We have been using Hydrofera Blue and antibiotic under compression. The Hydrofera Blue was stuck on tightly and had moved out of place under the wrap. She currently denies signs of infection. She is tolerating the compression wrap well. 6/6; patient changed to collagen last week also using Santyl. Dimensions are a lot better this week. She was also put under compression last week with 3 layer 6/13; patient presents for follow-up. We have been using collagen and Santyl under 3 layer compression. She continues to tolerate this well. She has no issues or complaints today. She denies signs of infection. 6/19; patient presents for follow-up. We have been using collagen and Santyl under 3 layer compression. She has no issues or complaints today. 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 She  has been more active. She currently denies signs of infection. 6/26; patient presents for follow-up. She was switched to PolyMem silver under 3 layer compression at last clinic visit. There is slightly more maceration to the periwound however the wound itself looks improved. She has no issues or complaints today. 7/10; patient presents for follow-up. We have been using Hydrofera Blue under 3 layer compression. She has no issues or complaints today. She had a nurse visit at last clinic visit for wrap change. 7/17; patient presents for follow-up. We have been using Hydrofera Blue under 3 layer compression. She has no issues or complaints today. She denies signs of infection. Patient History Information obtained from Patient. Family History Cancer - Father, Heart Disease - Father, Hypertension - Father, No family history of Diabetes, Kidney Disease, Lung Disease, Seizures, Stroke, Thyroid Problems, Tuberculosis. Social History Never smoker, Marital Status - Married, Alcohol Use - Never, Drug Use - No History, Caffeine Use - Never. Medical History Respiratory Patient has history of Asthma Cardiovascular Patient has history of Arrhythmia - A-fib, Hypertension Psychiatric Denies history of Anorexia/bulimia, Confinement Anxiety Hospitalization/Surgery History - breast surgery 8 years ago. Medical A Surgical History Notes nd Eyes cataract surgery both eyes Cardiovascular hypercholesterolemia Objective Constitutional respirations regular, non-labored and within target range for patient.. Vitals Time Taken: 8:46 AM, Weight: 118 lbs, Temperature: 97.8 F, Pulse: 106 bpm, Respiratory Rate: 16 breaths/min, Blood Pressure: 142/65 mmHg. Cardiovascular 2+ dorsalis pedis/posterior tibialis pulses. Psychiatric pleasant and cooperative. General Notes: Right lower extremity: Open wound to the anterior aspect with granulation tissue and nonviable tissue. No signs of surrounding infection. Good edema  control. Integumentary (Hair, Skin) Wound #1 status is Open. Original cause of wound was Trauma. The date acquired was: 06/04/2021. The wound has been in treatment 11 weeks. The wound is located on the Right,Anterior Lower Leg. The wound measures 1.5cm length x 1.7cm width x 0.2cm depth; 2.003cm^2 area and 0.401cm^3 volume. There is Fat Layer (Subcutaneous Tissue) exposed. There is no tunneling or undermining noted. There is a medium amount of serosanguineous drainage noted. The wound margin is distinct with the outline attached to the wound base. There is large (67-100%) red, pink granulation within the wound bed. There is a small (1-33%) amount of necrotic tissue within the wound bed including Adherent Slough. 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 I debrided nonviable tissue. No signs of surrounding infection. I recommended continuing the course with Hydrofera Blue under 3 layer compression. We will also add Santyl for further debridement. Procedures Wound #1 Pre-procedure diagnosis of Wound #1 is a Trauma, Other located on the Right,Anterior Lower Leg . There was a Excisional Skin/Subcutaneous Tissue Debridement with a total area of 2.55 sq cm performed by Kalman Shan, DO. With the following instrument(s): Curette to remove Non-Viable tissue/material. Material removed includes Subcutaneous Tissue and Slough and. No specimens were taken. A time out was conducted at 09:01, prior to the start of the procedure. A Minimum amount of bleeding was controlled with Pressure. The procedure was tolerated well. Post  Debridement Measurements: 1.5cm length x 1.7cm width x 0.2cm depth; 0.401cm^3 volume. Character of Wound/Ulcer Post Debridement is stable. Post procedure Diagnosis Wound #1: Same as Pre-Procedure Pre-procedure diagnosis of Wound #1 is a Trauma, Other located on the Right,Anterior Lower Leg . There was a Three Layer Compression Therapy Procedure by Lorrin Jackson, RN. Post procedure Diagnosis Wound #1: Same as Pre-Procedure Plan Follow-up Appointments: Return Appointment in 1 week. - Dr. Heber [place] and Leveda Anna (Room # 7) 09/14/2021 @ 8:45am Bathing/ Shower/ Hygiene: May shower with protection but do not get wound dressing(s) wet. - purchase cast protector from [facility], [facility], [place] Edema Control - Lymphedema / SCD / Other: Elevate legs to the level of the heart or above for 30 minutes daily and/or when sitting, a frequency of: - 2-3 times per day Avoid standing for long periods of time. - limit time standing still, sit when possible, wear compression stockings during day Exercise regularly - light walking WOUND #1: - Lower Leg Wound Laterality: Right, Anterior Cleanser: Soap and Water 1 x Per Week/30 Days Discharge Instructions: May shower and wash wound with dial antibacterial soap and water prior to dressing change. Peri-Wound Care: Sween Lotion (Moisturizing lotion) 1 x Per Week/30 Days Discharge Instructions: Apply moisturizing lotion as directed Prim Dressing: Hydrofera Blue Ready Foam, 4x5 in 1 x Per Week/30 Days ary Discharge Instructions: Apply to wound bed as instructed Prim Dressing: Santyl Ointment 1 x Per Week/30 Days ary Discharge Instructions: Apply nickel thick amount to wound bed as instructed Secondary Dressing: ABD Pad, 8x10 1 x Per Week/30 Days Discharge Instructions: Apply over primary dressing as directed. Com pression Wrap: ThreePress (3 layer compression wrap) 1 x Per Week/30 Days Discharge Instructions: Apply three layer compression as directed. 1. In office sharp  debridement 2. Hydrofera Blue, Santyl under 3 layer compression 3. Follow-up in 1 week Electronic Signature(s) Signed: 09/07/2021 10:03:27 AM By: Kalman Shan DO Entered By: Kalman Shan on 09/07/2021 09:53:42 -------------------------------------------------------------------------------- HxROS Details Patient Name: Date of Service: Melissa Randolph Melissa C. 09/07/2021 8:45 A M Medical Record Number: [id number] Patient Account Number: [id number] Date of Birth/Sex: Treating RN: Sep 30, 1934 (87 y.o. [name] Primary Care Provider: Antony Contras Other Clinician: Referring Provider: Treating Provider/Extender: Olivia Mackie in Treatment: 11 Information Obtained From Patient Eyes Medical History: Past Medical History Notes: cataract surgery both eyes Respiratory Medical History: Positive for: Asthma Cardiovascular Medical History: Positive for: Arrhythmia - A-fib; Hypertension Past Medical History Notes: hypercholesterolemia Psychiatric Medical History: Negative for: Anorexia/bulimia; Confinement Anxiety Immunizations Pneumococcal Vaccine: Received Pneumococcal Vaccination: Yes Received Pneumococcal Vaccination On or After 60th Birthday: Yes Implantable Devices None Hospitalization / Surgery History Type of  Hospitalization/Surgery breast surgery 8 years ago Family and Social History Cancer: Yes - Father; Diabetes: No; Heart Disease: Yes - Father; Hypertension: Yes - Father; Kidney Disease: No; Lung Disease: No; Seizures: No; Stroke: No; Thyroid Problems: No; Tuberculosis: No; Never smoker; Marital Status - Married; Alcohol Use: Never; Drug Use: No History; Caffeine Use: Never; Financial Concerns: No; Food, Clothing or Shelter Needs: No; Support System Lacking: No; Transportation Concerns: No Electronic Signature(s) Signed: 09/07/2021 10:03:27 AM By: Kalman Shan DO Signed: 09/07/2021 3:44:44 PM By: Lorrin Jackson Entered By: Kalman Shan on 09/07/2021 09:52:07 -------------------------------------------------------------------------------- [place] Details Patient Name: Date of Service: Melissa Randolph Melissa C. 09/07/2021 Medical Record Number: [id number] Patient Account Number: [id number] Date of Birth/Sex: Treating RN: Mar 07, 1934 (87 y.o. [name] Primary Care Provider: Antony Contras Other Clinician: Referring Provider: Treating Provider/Extender: Olivia Mackie in Treatment: 11 Diagnosis Coding ICD-10 Codes Code Description [phone] Non-pressure chronic ulcer of unspecified part of right lower leg with fat layer exposed T79.8XXA Other early complications of trauma, initial encounter I48.20 Chronic atrial fibrillation, unspecified Z79.01 Long term (current) use of anticoagulants I10 Essential (primary) hypertension I87.2 Venous insufficiency (chronic) (peripheral) Facility Procedures CPT4 Code: [id number] Description: 00867 - DEB SUBQ TISSUE 20 SQ CM/< ICD-10 Diagnosis Description Y19.509 Non-pressure chronic ulcer of unspecified part of right lower leg with fat lay Modifier: er exposed Quantity: 1 Physician Procedures : CPT4 Code Description Modifier [id number] - WC PHYS SUBQ TISS 20 SQ CM ICD-10 Diagnosis Description P80.998 Non-pressure chronic ulcer of unspecified part of right lower leg with fat layer exposed Quantity: 1 Electronic Signature(s) Signed: 09/07/2021 10:03:27 AM By: Kalman Shan DO Entered By: Kalman Shan on 09/07/2021 09:53:53

## 2021-09-07 NOTE — Progress Notes (Signed)
NIKIRA, KUSHNIR ([id number]) Visit Report for 09/07/2021 Arrival Information Details Patient Name: Date of Service: [name] 09/07/2021 8:45 A M Medical Record Number: [id number] Patient Account Number: [id number] Date of Birth/Sex: Treating RN: [birth year] (87 y.o. Helene Shoe, Meta.Reding Primary Care Jorita Bohanon: Antony Contras Other Clinician: Referring Tyre Beaver: Treating Jaylon Boylen/Extender: Olivia Mackie in Treatment: 11 Visit Information History Since Last Visit Added or deleted any medications: No Patient Arrived: Ambulatory Any new allergies or adverse reactions: No Arrival Time: 08:46 Had a fall or experienced change in No Accompanied By: self activities of daily living that may affect Transfer Assistance: None risk of falls: Patient Identification Verified: Yes Signs or symptoms of abuse/neglect since last visito No Secondary Verification Process Completed: Yes Hospitalized since last visit: No Patient Requires Transmission-Based Precautions: No Implantable device outside of the clinic excluding No Patient Has Alerts: Yes cellular tissue based products placed in the center Patient Alerts: Patient on Blood Thinner since last visit: Eliquis Has Dressing in Place as Prescribed: Yes Has Compression in Place as Prescribed: Yes Pain Present Now: No Electronic Signature(s) Signed: 09/07/2021 3:51:06 PM By: Deon Pilling RN, BSN Entered By: Deon Pilling on 09/07/2021 08:47:01 -------------------------------------------------------------------------------- Compression Therapy Details Patient Name: Date of Service: [name] 09/07/2021 8:45 A M Medical Record Number: [id number] Patient Account Number: [id number] Date of Birth/Sex: Treating RN: 1934/08/08 (88 y.o. [name] Primary Care Antwaine Boomhower: Antony Contras Other Clinician: Referring Fizza Scales: Treating Somara Frymire/Extender: Olivia Mackie in Treatment:  11 Compression Therapy Performed for Wound Assessment: Wound #1 Right,Anterior Lower Leg Performed By: Clinician Lorrin Jackson, RN Compression Type: Three Layer Post Procedure Diagnosis Same as Pre-procedure Electronic Signature(s) Signed: 09/07/2021 3:44:44 PM By: Lorrin Jackson Entered By: Lorrin Jackson on 09/07/2021 09:07:17 -------------------------------------------------------------------------------- Encounter Discharge Information Details Patient Name: Date of Service: [name] 09/07/2021 8:45 A M Medical Record Number: [id number] Patient Account Number: [id number] Date of Birth/Sex: Treating RN: [birth year] (88 y.o. [name] Primary Care Alexina Niccoli: Antony Contras Other Clinician: Referring Ryleigh Esqueda: Treating Syanne Looney/Extender: Olivia Mackie in Treatment: 11 Encounter Discharge Information Items Post Procedure Vitals Discharge Condition: Stable Temperature (F): 97.8 Ambulatory Status: Ambulatory Pulse (bpm): 106 Discharge Destination: Home Respiratory Rate (breaths/min): 16 Transportation: Private Auto Blood Pressure (mmHg): 142/65 Schedule Follow-up Appointment: Yes Clinical Summary of Care: Provided on 09/07/2021 Form Type Recipient Paper Patient Patient Electronic Signature(s) Signed: 09/07/2021 3:44:44 PM By: Lorrin Jackson Entered By: Lorrin Jackson on 09/07/2021 09:21:51 -------------------------------------------------------------------------------- Lower Extremity Assessment Details Patient Name: Date of Service: [name] 09/07/2021 8:45 A M Medical Record Number: [id number] Patient Account Number: [id number] Date of Birth/Sex: Treating RN: 08-27-1934 (87 y.o. Debby Bud Primary Care Jesicca Dipierro: Antony Contras Other Clinician: Referring Kden Wagster: Treating Chaze Hruska/Extender: Olivia Mackie in Treatment: 11 Edema Assessment Assessed: [name]: No] [name]: Yes] Edema: [Left:  N] [Right: o] Calf Left: Right: Point of Measurement: 28 cm From Medial Instep 31 cm Ankle Left: Right: Point of Measurement: 10 cm From Medial Instep 21 cm Vascular Assessment Pulses: Dorsalis Pedis Palpable: [Right:Yes] Electronic Signature(s) Signed: 09/07/2021 3:51:06 PM By: Deon Pilling RN, BSN Entered By: Deon Pilling on 09/07/2021 08:52:14 -------------------------------------------------------------------------------- Multi Wound Chart Details Patient Name: Date of Service: [name] 09/07/2021 8:45 A M Medical Record Number: [id number] Patient Account Number: [id number] Date of Birth/Sex: Treating RN: 1934-12-17 (87 y.o. [name] Primary Care Lisamarie Coke: Antony Contras Other Clinician: Referring Beren Yniguez: Treating Kamilya Wakeman/Extender: Heber [place],  Sampson Si, Bobbye Riggs in Treatment: 11 Vital Signs Height(in): Pulse(bpm): 106 Weight(lbs): 118 Blood Pressure(mmHg): 142/65 Body Mass Index(BMI): Temperature(F): 97.8 Respiratory Rate(breaths/min): 16 Photos: [N/A:N/A] Right, Anterior Lower Leg N/A N/A Wound Location: Trauma N/A N/A Wounding Event: Trauma, Other N/A N/A Primary Etiology: Asthma, Arrhythmia, Hypertension N/A N/A Comorbid History: 06/04/2021 N/A N/A Date Acquired: 11 N/A N/A Weeks of Treatment: Open N/A N/A Wound Status: No N/A N/A Wound Recurrence: 1.5x1.7x0.2 N/A N/A Measurements L x W x D (cm) 2.003 N/A N/A A (cm) : rea 0.401 N/A N/A Volume (cm) : 97.90% N/A N/A % Reduction in A rea: 95.80% N/A N/A % Reduction in Volume: Full Thickness Without Exposed N/A N/A Classification: Support Structures Medium N/A N/A Exudate A mount: Serosanguineous N/A N/A Exudate Type: red, brown N/A N/A Exudate Color: Distinct, outline attached N/A N/A Wound Margin: Large (67-100%) N/A N/A Granulation A mount: Red, Pink N/A N/A Granulation Quality: Small (1-33%) N/A N/A Necrotic A mount: Fat Layer (Subcutaneous  Tissue): Yes N/A N/A Exposed Structures: Fascia: No Tendon: No Muscle: No Joint: No Bone: No Medium (34-66%) N/A N/A Epithelialization: Debridement - Excisional N/A N/A Debridement: Pre-procedure Verification/Time Out 09:01 N/A N/A Taken: Subcutaneous, Slough N/A N/A Tissue Debrided: Skin/Subcutaneous Tissue N/A N/A Level: 2.55 N/A N/A Debridement A (sq cm): rea Curette N/A N/A Instrument: Minimum N/A N/A Bleeding: Pressure N/A N/A Hemostasis A chieved: Procedure was tolerated well N/A N/A Debridement Treatment Response: 1.5x1.7x0.2 N/A N/A Post Debridement Measurements L x W x D (cm) 0.401 N/A N/A Post Debridement Volume: (cm) Compression Therapy N/A N/A Procedures Performed: Debridement Treatment Notes Wound #1 (Lower Leg) Wound Laterality: Right, Anterior Cleanser Soap and Water Discharge Instruction: May shower and wash wound with dial antibacterial soap and water prior to dressing change. Peri-Wound Care Sween Lotion (Moisturizing lotion) Discharge Instruction: Apply moisturizing lotion as directed Topical Primary Dressing Hydrofera Blue Ready Foam, 4x5 in Discharge Instruction: Apply to wound bed as instructed Santyl Ointment Discharge Instruction: Apply nickel thick amount to wound bed as instructed Secondary Dressing ABD Pad, 8x10 Discharge Instruction: Apply over primary dressing as directed. Secured With Compression Wrap ThreePress (3 layer compression wrap) Discharge Instruction: Apply three layer compression as directed. Compression Stockings Add-Ons Electronic Signature(s) Signed: 09/07/2021 10:03:27 AM By: Kalman Shan DO Signed: 09/07/2021 3:44:44 PM By: Fara Chute By: Kalman Shan on 09/07/2021 09:51:39 -------------------------------------------------------------------------------- Multi-Disciplinary Care Plan Details Patient Name: Date of Service: [name] 09/07/2021 8:45 A M Medical Record Number:  [id number] Patient Account Number: [id number] Date of Birth/Sex: Treating RN: 1934-12-20 (87 y.o. [name] Primary Care Ramonita Koenig: Antony Contras Other Clinician: Referring Sigmund Morera: Treating Vincy Feliz/Extender: Olivia Mackie in Treatment: 11 Active Inactive Wound/Skin Impairment Nursing Diagnoses: Impaired tissue integrity Goals: Patient/caregiver will verbalize understanding of skin care regimen Date Initiated: 06/22/2021 Date Inactivated: 07/06/2021 Target Resolution Date: 07/25/2021 Goal Status: Met Ulcer/skin breakdown will have a volume reduction of 30% by week 4 Date Initiated: 06/22/2021 Date Inactivated: 08/31/2021 Target Resolution Date: 08/22/2021 Goal Status: Met Ulcer/skin breakdown will have a volume reduction of 50% by week 8 Date Initiated: 08/31/2021 Target Resolution Date: 09/14/2021 Goal Status: Active Interventions: Assess ulceration(s) every visit Provide education on ulcer and skin care Notes: Electronic Signature(s) Signed: 09/07/2021 9:01:10 AM By: Lorrin Jackson Entered By: Lorrin Jackson on 09/07/2021 09:01:10 -------------------------------------------------------------------------------- Pain Assessment Details Patient Name: Date of Service: [name] 09/07/2021 8:45 A M Medical Record Number: [id number] Patient Account Number: [id number] Date of Birth/Sex: Treating RN: 1935-01-07 (86  y.o. Debby Bud Primary Care Giuliano Preece: Antony Contras Other Clinician: Referring Elley Harp: Treating Lasean Rahming/Extender: Olivia Mackie in Treatment: 11 Active Problems Location of Pain Severity and Description of Pain Patient Has Paino No Site Locations Rate the pain. Current Pain Level: 0 Pain Management and Medication Current Pain Management: Medication: No Cold Application: No Rest: No Massage: No Activity: No T.E.N.S.: No Heat Application: No Leg drop or elevation: No Is the Current Pain  Management Adequate: Adequate How does your wound impact your activities of daily livingo Sleep: No Bathing: No Appetite: No Relationship With Others: No Bladder Continence: No Emotions: No Bowel Continence: No Work: No Toileting: No Drive: No Dressing: No Hobbies: No Electronic Signature(s) Signed: 09/07/2021 3:51:06 PM By: Deon Pilling RN, BSN Entered By: Deon Pilling on 09/07/2021 08:49:18 -------------------------------------------------------------------------------- Patient/Caregiver Education Details Patient Name: Date of Service: [name] 7/17/2023andnbsp8:45 A M Medical Record Number: [id number] Patient Account Number: [id number] Date of Birth/Gender: Treating RN: November 16, 1934 (87 y.o. [name] Primary Care Physician: Antony Contras Other Clinician: Referring Physician: Treating Physician/Extender: Olivia Mackie in Treatment: 11 Education Assessment Education Provided To: Patient Education Topics Provided Venous: Methods: Explain/Verbal, Printed Responses: State content correctly Wound/Skin Impairment: Methods: Explain/Verbal, Printed Responses: State content correctly Electronic Signature(s) Signed: 09/07/2021 3:44:44 PM By: Lorrin Jackson Entered By: Lorrin Jackson on 09/07/2021 09:01:28 -------------------------------------------------------------------------------- Wound Assessment Details Patient Name: Date of Service: [name] 09/07/2021 8:45 A M Medical Record Number: [id number] Patient Account Number: [id number] Date of Birth/Sex: Treating RN: January 02, 1935 (87 y.o. Helene Shoe, Meta.Reding Primary Care Lamesha Tibbits: Antony Contras Other Clinician: Referring Skilynn Durney: Treating Breiona Couvillon/Extender: Olivia Mackie in Treatment: 11 Wound Status Wound Number: 1 Primary Etiology: Trauma, Other Wound Location: Right, Anterior Lower Leg Wound Status: Open Wounding Event: Trauma Comorbid  History: Asthma, Arrhythmia, Hypertension Date Acquired: 06/04/2021 Weeks Of Treatment: 11 Clustered Wound: No Photos Wound Measurements Length: (cm) 1.5 Width: (cm) 1.7 Depth: (cm) 0.2 Area: (cm) 2.003 Volume: (cm) 0.401 % Reduction in Area: 97.9% % Reduction in Volume: 95.8% Epithelialization: Medium (34-66%) Tunneling: No Undermining: No Wound Description Classification: Full Thickness Without Exposed Support Structures Wound Margin: Distinct, outline attached Exudate Amount: Medium Exudate Type: Serosanguineous Exudate Color: red, brown Foul Odor After Cleansing: No Slough/Fibrino Yes Wound Bed Granulation Amount: Large (67-100%) Exposed Structure Granulation Quality: Red, Pink Fascia Exposed: No Necrotic Amount: Small (1-33%) Fat Layer (Subcutaneous Tissue) Exposed: Yes Necrotic Quality: Adherent Slough Tendon Exposed: No Muscle Exposed: No Joint Exposed: No Bone Exposed: No Treatment Notes Wound #1 (Lower Leg) Wound Laterality: Right, Anterior Cleanser Soap and Water Discharge Instruction: May shower and wash wound with dial antibacterial soap and water prior to dressing change. Peri-Wound Care Sween Lotion (Moisturizing lotion) Discharge Instruction: Apply moisturizing lotion as directed Topical Primary Dressing Hydrofera Blue Ready Foam, 4x5 in Discharge Instruction: Apply to wound bed as instructed Santyl Ointment Discharge Instruction: Apply nickel thick amount to wound bed as instructed Secondary Dressing ABD Pad, 8x10 Discharge Instruction: Apply over primary dressing as directed. Secured With Compression Wrap ThreePress (3 layer compression wrap) Discharge Instruction: Apply three layer compression as directed. Compression Stockings Add-Ons Electronic Signature(s) Signed: 09/07/2021 3:51:06 PM By: Deon Pilling RN, BSN Entered By: Deon Pilling on 09/07/2021  08:54:00 -------------------------------------------------------------------------------- Vitals Details Patient Name: Date of Service: [name] 09/07/2021 8:45 A M Medical Record Number: [id number] Patient Account Number: [id number] Date of Birth/Sex: Treating RN: 06-19-1934 (87 y.o. Debby Bud Primary Care Daunte Oestreich:  Antony Contras Other Clinician: Referring Marcell Pfeifer: Treating Cindra Austad/Extender: Olivia Mackie in Treatment: 11 Vital Signs Time Taken: 08:46 Temperature (F): 97.8 Weight (lbs): 118 Pulse (bpm): 106 Respiratory Rate (breaths/min): 16 Blood Pressure (mmHg): 142/65 Reference Range: 80 - 120 mg / dl Electronic Signature(s) Signed: 09/07/2021 3:51:06 PM By: Deon Pilling RN, BSN Entered By: Deon Pilling on 09/07/2021 08:54:23

## 2021-09-09 DIAGNOSIS — M85852 Other specified disorders of bone density and structure, left thigh: Secondary | ICD-10-CM | POA: Diagnosis not present

## 2021-09-09 DIAGNOSIS — Z780 Asymptomatic menopausal state: Secondary | ICD-10-CM | POA: Diagnosis not present

## 2021-09-09 DIAGNOSIS — M8580 Other specified disorders of bone density and structure, unspecified site: Secondary | ICD-10-CM | POA: Diagnosis not present

## 2021-09-09 DIAGNOSIS — M810 Age-related osteoporosis without current pathological fracture: Secondary | ICD-10-CM | POA: Diagnosis not present

## 2021-09-14 ENCOUNTER — Encounter (HOSPITAL_BASED_OUTPATIENT_CLINIC_OR_DEPARTMENT_OTHER): Payer: Medicare Other | Admitting: Internal Medicine

## 2021-09-14 DIAGNOSIS — T798XXA Other early complications of trauma, initial encounter: Secondary | ICD-10-CM | POA: Diagnosis not present

## 2021-09-14 DIAGNOSIS — Z7901 Long term (current) use of anticoagulants: Secondary | ICD-10-CM | POA: Diagnosis not present

## 2021-09-14 DIAGNOSIS — I872 Venous insufficiency (chronic) (peripheral): Secondary | ICD-10-CM | POA: Diagnosis not present

## 2021-09-14 DIAGNOSIS — I10 Essential (primary) hypertension: Secondary | ICD-10-CM | POA: Diagnosis not present

## 2021-09-14 DIAGNOSIS — I482 Chronic atrial fibrillation, unspecified: Secondary | ICD-10-CM | POA: Diagnosis not present

## 2021-09-14 DIAGNOSIS — L97912 Non-pressure chronic ulcer of unspecified part of right lower leg with fat layer exposed: Secondary | ICD-10-CM

## 2021-09-14 NOTE — Progress Notes (Signed)
Melissa, Randolph ([id number]) Visit Report for 09/14/2021 Arrival Information Details Patient Name: Date of Service: Melissa Randolph 09/14/2021 8:45 A M Medical Record Number: [id number] Patient Account Number: [id number] Date of Birth/Sex: Treating RN: 26-Aug-1934 (87 y.o. [name] Primary Care Arlissa Monteverde: Antony Contras Other Clinician: Referring Luisantonio Adinolfi: Treating Tywanna Seifer/Extender: Olivia Mackie in Treatment: 12 Visit Information History Since Last Visit Added or deleted any medications: No Patient Arrived: Ambulatory Any new allergies or adverse reactions: No Arrival Time: 09:08 Had a fall or experienced change in No Transfer Assistance: None activities of daily living that may affect Patient Identification Verified: Yes risk of falls: Secondary Verification Process Completed: Yes Signs or symptoms of abuse/neglect since last visito No Patient Requires Transmission-Based Precautions: No Hospitalized since last visit: No Patient Has Alerts: Yes Implantable device outside of the clinic excluding No Patient Alerts: Patient on Blood Thinner cellular tissue based products placed in the center Eliquis since last visit: Has Dressing in Place as Prescribed: Yes Has Compression in Place as Prescribed: Yes Pain Present Now: No Electronic Signature(s) Signed: 09/14/2021 4:30:49 PM By: Lorrin Jackson Entered By: Lorrin Jackson on 09/14/2021 09:08:27 -------------------------------------------------------------------------------- Compression Therapy Details Patient Name: Date of Service: Melissa Leach C. 09/14/2021 8:45 A M Medical Record Number: [id number] Patient Account Number: [id number] Date of Birth/Sex: Treating RN: 1934/07/24 (87 y.o. [name] Primary Care Lasasha Brophy: Antony Contras Other Clinician: Referring Akacia Boltz: Treating Clovia Reine/Extender: Olivia Mackie in Treatment: 12 Compression Therapy  Performed for Wound Assessment: Wound #1 Right,Anterior Lower Leg Performed By: Clinician Lorrin Jackson, RN Compression Type: Three Layer Post Procedure Diagnosis Same as Pre-procedure Electronic Signature(s) Signed: 09/14/2021 4:30:49 PM By: Lorrin Jackson Entered By: Lorrin Jackson on 09/14/2021 09:37:04 -------------------------------------------------------------------------------- Encounter Discharge Information Details Patient Name: Date of Service: Melissa Leach C. 09/14/2021 8:45 A M Medical Record Number: [id number] Patient Account Number: [id number] Date of Birth/Sex: Treating RN: 31-Oct-1934 (87 y.o. [name] Primary Care Melissa Randolph: Antony Contras Other Clinician: Referring Omega Durante: Treating Trino Higinbotham/Extender: Olivia Mackie in Treatment: 12 Encounter Discharge Information Items Post Procedure Vitals Discharge Condition: Stable Temperature (F): 98.7 Ambulatory Status: Ambulatory Pulse (bpm): 72 Discharge Destination: Home Respiratory Rate (breaths/min): 18 Transportation: Private Auto Blood Pressure (mmHg): 147/77 Schedule Follow-up Appointment: Yes Clinical Summary of Care: Provided on 09/14/2021 Form Type Recipient Paper Patient Patient Electronic Signature(s) Signed: 09/14/2021 4:30:49 PM By: Lorrin Jackson Entered By: Lorrin Jackson on 09/14/2021 09:41:52 -------------------------------------------------------------------------------- Lower Extremity Assessment Details Patient Name: Date of Service: Melissa Randolph 09/14/2021 8:45 A M Medical Record Number: [id number] Patient Account Number: [id number] Date of Birth/Sex: Treating RN: 10/26/1934 (87 y.o. [name] Primary Care Melissa Randolph: Antony Contras Other Clinician: Referring Kionte Baumgardner: Treating Tabby Beaston/Extender: Olivia Mackie in Treatment: 12 Edema Assessment Assessed: [name]: No] [name]: Yes] Edema: [Left: N] [Right:  o] Calf Left: Right: Point of Measurement: 28 cm From Medial Instep 31 cm Ankle Left: Right: Point of Measurement: 10 cm From Medial Instep 19 cm Vascular Assessment Pulses: Dorsalis Pedis Palpable: [Right:Yes] Electronic Signature(s) Signed: 09/14/2021 4:30:49 PM By: Lorrin Jackson Entered By: Lorrin Jackson on 09/14/2021 09:13:46 -------------------------------------------------------------------------------- Multi Wound Chart Details Patient Name: Date of Service: Melissa Leach C. 09/14/2021 8:45 A M Medical Record Number: [id number] Patient Account Number: [id number] Date of Birth/Sex: Treating RN: 1934/06/08 (87 y.o. [name] Primary Care Burhan Barham: Antony Contras Other Clinician: Referring Sephira Zellman: Treating Jem Castro/Extender: Olivia Mackie in Treatment: 12  Vital Signs Height(in): Pulse(bpm): 72 Weight(lbs): 118 Blood Pressure(mmHg): 147/77 Body Mass Index(BMI): Temperature(F): 98.7 Respiratory Rate(breaths/min): 18 Photos: [N/A:N/A] Right, Anterior Lower Leg N/A N/A Wound Location: Trauma N/A N/A Wounding Event: Trauma, Other N/A N/A Primary Etiology: Asthma, Arrhythmia, Hypertension N/A N/A Comorbid History: 06/04/2021 N/A N/A Date Acquired: 12 N/A N/A Weeks of Treatment: Open N/A N/A Wound Status: No N/A N/A Wound Recurrence: 0.8x1.1x0.1 N/A N/A Measurements L x W x D (cm) 0.691 N/A N/A A (cm) : rea 0.069 N/A N/A Volume (cm) : 99.30% N/A N/A % Reduction in A rea: 99.30% N/A N/A % Reduction in Volume: Full Thickness Without Exposed N/A N/A Classification: Support Structures Medium N/A N/A Exudate A mount: Serosanguineous N/A N/A Exudate Type: red, brown N/A N/A Exudate Color: Distinct, outline attached N/A N/A Wound Margin: Large (67-100%) N/A N/A Granulation A mount: Red, Pink N/A N/A Granulation Quality: Small (1-33%) N/A N/A Necrotic A mount: Fat Layer (Subcutaneous Tissue): Yes N/A  N/A Exposed Structures: Fascia: No Tendon: No Muscle: No Joint: No Bone: No Large (67-100%) N/A N/A Epithelialization: Debridement - Excisional N/A N/A Debridement: Pre-procedure Verification/Time Out 09:34 N/A N/A Taken: Lidocaine 4% Topical Solution N/A N/A Pain Control: Subcutaneous, Slough N/A N/A Tissue Debrided: Skin/Subcutaneous Tissue N/A N/A Level: 0.88 N/A N/A Debridement A (sq cm): rea Curette N/A N/A Instrument: Minimum N/A N/A Bleeding: Pressure N/A N/A Hemostasis A chieved: Procedure was tolerated well N/A N/A Debridement Treatment Response: 0.8x1.1x0.1 N/A N/A Post Debridement Measurements L x W x D (cm) 0.069 N/A N/A Post Debridement Volume: (cm) Compression Therapy N/A N/A Procedures Performed: Debridement Treatment Notes Wound #1 (Lower Leg) Wound Laterality: Right, Anterior Cleanser Soap and Water Discharge Instruction: May shower and wash wound with dial antibacterial soap and water prior to dressing change. Peri-Wound Care Sween Lotion (Moisturizing lotion) Discharge Instruction: Apply moisturizing lotion as directed Topical Primary Dressing Hydrofera Blue Ready Foam, 4x5 in Discharge Instruction: Apply to wound bed as instructed Santyl Ointment Discharge Instruction: Apply nickel thick amount to wound bed as instructed Secondary Dressing ABD Pad, 8x10 Discharge Instruction: Apply over primary dressing as directed. Secured With Compression Wrap ThreePress (3 layer compression wrap) Discharge Instruction: Apply three layer compression as directed. Compression Stockings Add-Ons Electronic Signature(s) Signed: 09/14/2021 10:17:40 AM By: Kalman Shan DO Signed: 09/14/2021 4:30:49 PM By: Fara Chute By: Kalman Shan on 09/14/2021 09:43:46 -------------------------------------------------------------------------------- Multi-Disciplinary Care Plan Details Patient Name: Date of Service: Melissa Leach C.  09/14/2021 8:45 A M Medical Record Number: [id number] Patient Account Number: [id number] Date of Birth/Sex: Treating RN: 06-06-1934 (87 y.o. [name] Primary Care Zhana Jeangilles: Antony Contras Other Clinician: Referring Mea Ozga: Treating Fedra Lanter/Extender: Olivia Mackie in Treatment: 12 Active Inactive Wound/Skin Impairment Nursing Diagnoses: Impaired tissue integrity Goals: Patient/caregiver will verbalize understanding of skin care regimen Date Initiated: 06/22/2021 Date Inactivated: 07/06/2021 Target Resolution Date: 07/25/2021 Goal Status: Met Ulcer/skin breakdown will have a volume reduction of 30% by week 4 Date Initiated: 06/22/2021 Date Inactivated: 08/31/2021 Target Resolution Date: 08/22/2021 Goal Status: Met Ulcer/skin breakdown will have a volume reduction of 50% by week 8 Date Initiated: 08/31/2021 Date Inactivated: 09/14/2021 Target Resolution Date: 09/14/2021 Goal Status: Met Ulcer/skin breakdown will have a volume reduction of 80% by week 12 Date Initiated: 09/14/2021 Target Resolution Date: 10/12/2021 Goal Status: Active Interventions: Assess ulceration(s) every visit Provide education on ulcer and skin care Notes: Electronic Signature(s) Signed: 09/14/2021 4:30:49 PM By: Lorrin Jackson Entered By: Lorrin Jackson on 09/14/2021 09:14:28 -------------------------------------------------------------------------------- Pain Assessment Details Patient Name: Date  of Service: Melissa Randolph 09/14/2021 8:45 A M Medical Record Number: [id number] Patient Account Number: [id number] Date of Birth/Sex: Treating RN: 01-29-1935 (87 y.o. [name] Primary Care Radwan Cowley: Antony Contras Other Clinician: Referring Myers Tutterow: Treating Aailyah Dunbar/Extender: Olivia Mackie in Treatment: 12 Active Problems Location of Pain Severity and Description of Pain Patient Has Paino No Site Locations Pain Management and  Medication Current Pain Management: Electronic Signature(s) Signed: 09/14/2021 4:30:49 PM By: Lorrin Jackson Entered By: Lorrin Jackson on 09/14/2021 09:08:35 -------------------------------------------------------------------------------- Patient/Caregiver Education Details Patient Name: Date of Service: Melissa Randolph 7/24/2023andnbsp8:45 A M Medical Record Number: [id number] Patient Account Number: [id number] Date of Birth/Gender: Treating RN: [birth year] (87 y.o. [name] Primary Care Physician: Antony Contras Other Clinician: Referring Physician: Treating Physician/Extender: Olivia Mackie in Treatment: 12 Education Assessment Education Provided To: Patient Education Topics Provided Venous: Methods: Explain/Verbal, Printed Responses: State content correctly Wound/Skin Impairment: Methods: Explain/Verbal, Printed Responses: State content correctly Electronic Signature(s) Signed: 09/14/2021 4:30:49 PM By: Lorrin Jackson Entered By: Lorrin Jackson on 09/14/2021 09:14:47 -------------------------------------------------------------------------------- Wound Assessment Details Patient Name: Date of Service: Melissa Randolph 09/14/2021 8:45 A M Medical Record Number: [id number] Patient Account Number: [id number] Date of Birth/Sex: Treating RN: 1934/04/13 (87 y.o. [name] Primary Care Gaylene Moylan: Antony Contras Other Clinician: Referring Yameli Delamater: Treating Jaquez Farrington/Extender: Olivia Mackie in Treatment: 12 Wound Status Wound Number: 1 Primary Etiology: Trauma, Other Wound Location: Right, Anterior Lower Leg Wound Status: Open Wounding Event: Trauma Comorbid History: Asthma, Arrhythmia, Hypertension Date Acquired: 06/04/2021 Weeks Of Treatment: 12 Clustered Wound: No Photos Wound Measurements Length: (cm) 0.8 Width: (cm) 1.1 Depth: (cm) 0.1 Area: (cm) 0.691 Volume: (cm) 0.069 % Reduction in  Area: 99.3% % Reduction in Volume: 99.3% Epithelialization: Large (67-100%) Tunneling: No Undermining: No Wound Description Classification: Full Thickness Without Exposed Support Structures Wound Margin: Distinct, outline attached Exudate Amount: Medium Exudate Type: Serosanguineous Exudate Color: red, brown Foul Odor After Cleansing: No Slough/Fibrino Yes Wound Bed Granulation Amount: Large (67-100%) Exposed Structure Granulation Quality: Red, Pink Fascia Exposed: No Necrotic Amount: Small (1-33%) Fat Layer (Subcutaneous Tissue) Exposed: Yes Necrotic Quality: Adherent Slough Tendon Exposed: No Muscle Exposed: No Joint Exposed: No Bone Exposed: No Treatment Notes Wound #1 (Lower Leg) Wound Laterality: Right, Anterior Cleanser Soap and Water Discharge Instruction: May shower and wash wound with dial antibacterial soap and water prior to dressing change. Peri-Wound Care Sween Lotion (Moisturizing lotion) Discharge Instruction: Apply moisturizing lotion as directed Topical Primary Dressing Hydrofera Blue Ready Foam, 4x5 in Discharge Instruction: Apply to wound bed as instructed Santyl Ointment Discharge Instruction: Apply nickel thick amount to wound bed as instructed Secondary Dressing ABD Pad, 8x10 Discharge Instruction: Apply over primary dressing as directed. Secured With Compression Wrap ThreePress (3 layer compression wrap) Discharge Instruction: Apply three layer compression as directed. Compression Stockings Add-Ons Electronic Signature(s) Signed: 09/14/2021 4:30:49 PM By: Lorrin Jackson Entered By: Lorrin Jackson on 09/14/2021 09:18:47 -------------------------------------------------------------------------------- Ewing Details Patient Name: Date of Service: Melissa Leach C. 09/14/2021 8:45 A M Medical Record Number: [id number] Patient Account Number: [id number] Date of Birth/Sex: Treating RN: 28-Sep-1934 (87 y.o. [name] Primary Care  Theseus Birnie: Antony Contras Other Clinician: Referring Brynden Thune: Treating Ludwika Rodd/Extender: Olivia Mackie in Treatment: 12 Vital Signs Time Taken: 09:05 Temperature (F): 98.7 Weight (lbs): 118 Pulse (bpm): 72 Respiratory Rate (breaths/min): 18 Blood Pressure (mmHg): 147/77 Reference Range: 80 - 120 mg / dl Electronic Signature(s) Signed: 09/14/2021 4:30:49  PM By: Lorrin Jackson Entered By: Lorrin Jackson on 09/14/2021 09:09:59

## 2021-09-14 NOTE — Progress Notes (Signed)
Melissa Randolph, Melissa Randolph ([id number]) Visit Report for 09/14/2021 Chief Complaint Document Details Patient Name: Date of Service: Melissa Randolph 09/14/2021 8:45 A M Medical Record Number: [id number] Patient Account Number: [id number] Date of Birth/Sex: Treating RN: [birth year] (87 y.o. [name] Primary Care Provider: Antony Contras Other Clinician: Referring Provider: Treating Provider/Extender: Olivia Mackie in Treatment: 12 Information Obtained from: Patient Chief Complaint 06/22/2021; Right lower extremity wound following trauma Electronic Signature(s) Signed: 09/14/2021 10:17:40 AM By: Kalman Shan DO Entered By: Kalman Shan on 09/14/2021 09:43:56 -------------------------------------------------------------------------------- Debridement Details Patient Name: Date of Service: Melissa Leach C. 09/14/2021 8:45 A M Medical Record Number: [id number] Patient Account Number: [id number] Date of Birth/Sex: Treating RN: 01-10-1935 (87 y.o. [name] Primary Care Provider: Antony Contras Other Clinician: Referring Provider: Treating Provider/Extender: Olivia Mackie in Treatment: 12 Debridement Performed for Assessment: Wound #1 Right,Anterior Lower Leg Performed By: Physician Kalman Shan, DO Debridement Type: Debridement Level of Consciousness (Pre-procedure): Awake and Alert Pre-procedure Verification/Time Out Yes - 09:34 Taken: Start Time: 09:35 Pain Control: Lidocaine 4% T opical Solution T Area Debrided (L x W): otal 0.8 (cm) x 1.1 (cm) = 0.88 (cm) Tissue and other material debrided: Non-Viable, Slough, Subcutaneous, Slough Level: Skin/Subcutaneous Tissue Debridement Description: Excisional Instrument: Curette Bleeding: Minimum Hemostasis Achieved: Pressure End Time: 09:37 Response to Treatment: Procedure was tolerated well Level of Consciousness (Post- Awake and Alert procedure): Post  Debridement Measurements of Total Wound Length: (cm) 0.8 Width: (cm) 1.1 Depth: (cm) 0.1 Volume: (cm) 0.069 Character of Wound/Ulcer Post Debridement: Stable Post Procedure Diagnosis Same as Pre-procedure Notes Procedure scribed for Dr. Heber [place] by Janan Halter, RN Electronic Signature(s) Signed: 09/14/2021 10:17:40 AM By: Kalman Shan DO Signed: 09/14/2021 4:30:49 PM By: Lorrin Jackson Entered By: Lorrin Jackson on 09/14/2021 09:38:32 -------------------------------------------------------------------------------- HPI Details Patient Name: Date of Service: Melissa Bryant [name] 09/14/2021 8:45 A M Medical Record Number: [id number] Patient Account Number: [id number] Date of Birth/Sex: Treating RN: 1935-01-17 (87 y.o. [name] Primary Care Provider: Antony Contras Other Clinician: Referring Provider: Treating Provider/Extender: Olivia Mackie in Treatment: 12 History of Present Illness HPI Description: Admission 06/22/2021 Melissa Randolph is an 87 year old female with a past medical history of A-fib on Eliquis that presents to the clinic for a wound to her right lower extremity. She fell over a wooden baseboard on 4/13. She went to the ED for evaluation and she had 15 stitches placed. She had an x-ray of her leg that showed no acute osseous abnormality. She has been placing Xeroform over the wound bed intermittently. She visited the ED again on 4/21 because of wound infection. She was started on doxycycline and cefadroxil. She states she has a couple days remaining. She currently denies systemic signs of infection. 5/8; patient presents for follow-up. She has been using bacitracin and Xeroform to the wound bed. She reports significant improvement in her pain to the wound bed. She states she is using her compression stockings daily. She denies signs of infection. 5/15; patient presents for follow-up. She has been using Iodoflex to the wound bed. She has  no issues or complaints today. She has been using her compression stocking daily. She went to vein and vascular to have her ABIs with TBI's completed. On the right she had an ABI of 1.17 and TBI of 0.68. 5/22; patient presents for follow-up. We switched to [facility] and antibiotic ointment under a compression wrap at last clinic visit. She tolerated  the wrap well. She has no issues or complaints today. She denies signs of infection. 5/30; patient presents for follow-up. We have been using Hydrofera Blue and antibiotic under compression. The Hydrofera Blue was stuck on tightly and had moved out of place under the wrap. She currently denies signs of infection. She is tolerating the compression wrap well. 6/6; patient changed to collagen last week also using Santyl. Dimensions are a lot better this week. She was also put under compression last week with 3 layer 6/13; patient presents for follow-up. We have been using collagen and Santyl under 3 layer compression. She continues to tolerate this well. She has no issues or complaints today. She denies signs of infection. 6/19; patient presents for follow-up. We have been using collagen and Santyl under 3 layer compression. She has no issues or complaints today. 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 She has been more active. She currently denies signs of infection. 6/26; patient presents for follow-up. She was switched to PolyMem silver under 3 layer compression at last clinic visit. There is slightly more maceration to the periwound however the wound itself looks improved. She has no issues or complaints today. 7/10; patient presents for follow-up. We have been using Hydrofera Blue under 3 layer compression. She has no issues or complaints today. She had a nurse visit at last clinic visit for wrap change. 7/17; patient presents for follow-up. We have been using Hydrofera Blue under 3 layer compression. She has no  issues or complaints today. She denies signs of infection. 7/24; Patient presents for follow-up. We have been using Hydrofera Blue and Santyl under 3 layer compression. Patient tolerated this well. She has no issues or complaints today. Electronic Signature(s) Signed: 09/14/2021 10:17:40 AM By: Kalman Shan DO Entered By: Kalman Shan on 09/14/2021 09:46:57 -------------------------------------------------------------------------------- Physical Exam Details Patient Name: Date of Service: Melissa Randolph 09/14/2021 8:45 A M Medical Record Number: [id number] Patient Account Number: [id number] Date of Birth/Sex: Treating RN: May 11, 1934 (87 y.o. [name] Primary Care Provider: Antony Contras Other Clinician: Referring Provider: Treating Provider/Extender: Olivia Mackie in Treatment: 12 Constitutional respirations regular, non-labored and within target range for patient.. Cardiovascular 2+ dorsalis pedis/posterior tibialis pulses. Psychiatric pleasant and cooperative. Notes Right lower extremity: Open wound to the anterior aspect with granulation tissue and nonviable tissue. No signs of surrounding infection. Good edema control. Electronic Signature(s) Signed: 09/14/2021 10:17:40 AM By: Kalman Shan DO Entered By: Kalman Shan on 09/14/2021 09:47:18 -------------------------------------------------------------------------------- Physician Orders Details Patient Name: Date of Service: Melissa Leach C. 09/14/2021 8:45 A M Medical Record Number: [id number] Patient Account Number: [id number] Date of Birth/Sex: Treating RN: 1934-03-07 (87 y.o. 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 - Dr. Heber [place] and Leveda Anna (Room # 7) 09/21/2021 @ 8:45am Return A Bathing/ Shower/ Hygiene May shower with protection but do not get wound dressing(s) wet. - purchase cast protector from [facility], [facility], [employer] Edema Control - Lymphedema / SCD / Other Elevate legs to the level  of the heart or above for 30 minutes daily and/or when sitting, a frequency of: - 2-3 times per day void standing for long periods of time. - limit time standing still, sit when possible, wear compression stockings during day A Exercise regularly - light walking Wound Treatment Wound #1 - Lower Leg Wound Laterality: Right, Anterior Cleanser: Soap and Water 1 x Per Week/30 Days Discharge Instructions: May shower and wash wound with dial antibacterial soap and water prior to dressing change. Peri-Wound Care: Sween Lotion (Moisturizing lotion) 1 x Per Week/30 Days Discharge Instructions: Apply moisturizing lotion as directed Prim Dressing: Hydrofera Blue Ready Foam, 4x5 in 1 x Per Week/30 Days ary Discharge Instructions: Apply to wound bed as instructed Prim Dressing: Santyl Ointment 1 x Per Week/30 Days ary Discharge Instructions: Apply nickel thick amount to wound bed as instructed Secondary Dressing: ABD Pad, 8x10 1 x Per Week/30 Days Discharge Instructions: Apply over primary dressing as directed. Compression Wrap: ThreePress (3 layer compression wrap) 1 x Per Week/30 Days Discharge Instructions: Apply three layer compression as directed. Electronic Signature(s) Signed: 09/14/2021 3:08:01 PM By: Kalman Shan DO Signed: 09/14/2021 4:30:49 PM By: Lorrin Jackson Previous Signature: 09/14/2021 10:17:40 AM Version By: Kalman Shan DO Entered By: Lorrin Jackson on 09/14/2021 10:52:37 -------------------------------------------------------------------------------- Problem List Details Patient Name: Date of Service: Melissa Leach C. 09/14/2021 8:45 A M Medical Record Number: [id number] Patient Account Number: [id number] Date of Birth/Sex: Treating RN: November 11, 1934 (87 y.o. [name] Primary Care Provider: Antony Contras Other Clinician: Referring Provider: Treating Provider/Extender: Olivia Mackie in Treatment: 12 Active Problems ICD-10 Encounter Code Description Active Date MDM Diagnosis [phone] Non-pressure chronic ulcer of unspecified part of right lower leg with fat layer 06/22/2021 No Yes exposed T79.8XXA Other early complications of trauma, initial encounter 06/22/2021 No Yes I48.20 Chronic atrial fibrillation, unspecified 06/22/2021 No Yes Z79.01 Long term (current) use of anticoagulants 06/22/2021 No Yes I10 Essential (primary) hypertension 06/22/2021 No Yes I87.2 Venous insufficiency (chronic) (peripheral) 07/06/2021 No Yes Inactive Problems Resolved Problems Electronic Signature(s) Signed: 09/14/2021 10:17:40 AM By: Kalman Shan DO Entered By: Kalman Shan on 09/14/2021 09:43:42 -------------------------------------------------------------------------------- Progress Note Details Patient Name: Date of Service: Melissa Leach C. 09/14/2021 8:45 A M Medical Record Number: [id number] Patient Account Number: [id number] Date of Birth/Sex: Treating RN: [birth year] (87 y.o. [name] Primary Care Provider: Antony Contras Other Clinician: Referring Provider: Treating Provider/Extender: Olivia Mackie in Treatment: 12 Subjective Chief Complaint Information obtained from Patient 06/22/2021; Right lower extremity wound following trauma History of Present Illness  (HPI) Admission 06/22/2021 Melissa Randolph is an 87 year old female with a past medical history of A-fib on Eliquis that presents to the clinic for a wound to her right lower extremity. She fell over a wooden baseboard on 4/13. She went to the ED for evaluation and she had 15 stitches placed. She had an x-ray of her leg that showed no acute osseous abnormality. She has been placing Xeroform over the wound bed intermittently. She visited the ED again on 4/21 because of wound infection. She was started on doxycycline and cefadroxil. She states she has a couple days remaining. She currently denies systemic signs of infection. 5/8; patient presents for follow-up. She has been using bacitracin and Xeroform to the wound bed. She reports significant improvement in her pain to the wound bed. She states she is using her compression stockings daily. She denies signs of infection. 5/15; patient presents for follow-up. She has been using  Iodoflex to the wound bed. She has no issues or complaints today. She has been using her compression stocking daily. She went to vein and vascular to have her ABIs with TBI's completed. On the right she had an ABI of 1.17 and TBI of 0.68. 5/22; patient presents for follow-up. We switched to [facility] and antibiotic ointment under a compression wrap at last clinic visit. She tolerated the wrap well. She has no issues or complaints today. She denies signs of infection. 5/30; patient presents for follow-up. We have been using Hydrofera Blue and antibiotic under compression. The Hydrofera Blue was stuck on tightly and had moved out of place under the wrap. She currently denies signs of infection. She is tolerating the compression wrap well. 6/6; patient changed to collagen last week also using Santyl. Dimensions are a lot better this week. She was also put under compression last week with 3 layer 6/13; patient presents for follow-up. We have been using collagen and Santyl  under 3 layer compression. She continues to tolerate this well. She has no issues or complaints today. She denies signs of infection. 6/19; patient presents for follow-up. We have been using collagen and Santyl under 3 layer compression. She has no issues or complaints today. 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 She has been more active. She currently denies signs of infection. 6/26; patient presents for follow-up. She was switched to PolyMem silver under 3 layer compression at last clinic visit. There is slightly more maceration to the periwound however the wound itself looks improved. She has no issues or complaints today. 7/10; patient presents for follow-up. We have been using Hydrofera Blue under 3 layer compression. She has no issues or complaints today. She had a nurse visit at last clinic visit for wrap change. 7/17; patient presents for follow-up. We have been using Hydrofera Blue under 3 layer compression. She has no issues or complaints today. She denies signs of infection. 7/24; Patient presents for follow-up. We have been using Hydrofera Blue and Santyl under 3 layer compression. Patient tolerated this well. She has no issues or complaints today. Patient History Information obtained from Patient. Family History Cancer - Father, Heart Disease - Father, Hypertension - Father, No family history of Diabetes, Kidney Disease, Lung Disease, Seizures, Stroke, Thyroid Problems, Tuberculosis. Social History Never smoker, Marital Status - Married, Alcohol Use - Never, Drug Use - No History, Caffeine Use - Never. Medical History Respiratory Patient has history of Asthma Cardiovascular Patient has history of Arrhythmia - A-fib, Hypertension Psychiatric Denies history of Anorexia/bulimia, Confinement Anxiety Hospitalization/Surgery History - breast surgery 8 years ago. Medical A Surgical History Notes nd Eyes cataract surgery both  eyes Cardiovascular hypercholesterolemia Objective Constitutional respirations regular, non-labored and within target range for patient.. Vitals Time Taken: 9:05 AM, Weight: 118 lbs, Temperature: 98.7 F, Pulse: 72 bpm, Respiratory Rate: 18 breaths/min, Blood Pressure: 147/77 mmHg. Cardiovascular 2+ dorsalis pedis/posterior tibialis pulses. Psychiatric pleasant and cooperative. General Notes: Right lower extremity: Open wound to the anterior aspect with granulation tissue and nonviable tissue. No signs of surrounding infection. Good edema control. Integumentary (Hair, Skin) Wound #1 status is Open. Original cause of wound was Trauma. The date acquired was: 06/04/2021. The wound has been in treatment 12 weeks. The wound is located on the Right,Anterior Lower Leg. The wound measures 0.8cm length x 1.1cm width x 0.1cm depth; 0.691cm^2 area and 0.069cm^3 volume. There is Fat Layer (Subcutaneous Tissue) exposed. There is no tunneling or  undermining noted. There is a medium amount of serosanguineous drainage noted. The wound margin is distinct with the outline attached to the wound base. There is large (67-100%) red, pink granulation within the wound bed. There is a small (1-33%) amount of necrotic tissue within the wound bed including Adherent Slough. 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 I debrided nonviable tissue. I recommended continuing the course with Hydrofera Blue and Santyl under compression. Follow-up in 1 week. Procedures Wound #1 Pre-procedure diagnosis of Wound #1 is a Trauma, Other located on the  Right,Anterior Lower Leg . There was a Excisional Skin/Subcutaneous Tissue Debridement with a total area of 0.88 sq cm performed by Kalman Shan, DO. With the following instrument(s): Curette to remove Non-Viable tissue/material. Material removed includes Subcutaneous Tissue and Slough and after achieving pain control using Lidocaine 4% T opical Solution. No specimens were taken. A time out was conducted at 09:34, prior to the start of the procedure. A Minimum amount of bleeding was controlled with Pressure. The procedure was tolerated well. Post Debridement Measurements: 0.8cm length x 1.1cm width x 0.1cm depth; 0.069cm^3 volume. Character of Wound/Ulcer Post Debridement is stable. Post procedure Diagnosis Wound #1: Same as Pre-Procedure General Notes: Procedure scribed for Dr. Heber [place] by Janan Halter, RN. Pre-procedure diagnosis of Wound #1 is a Trauma, Other located on the Right,Anterior Lower Leg . There was a Three Layer Compression Therapy Procedure by Lorrin Jackson, RN. Post procedure Diagnosis Wound #1: Same as Pre-Procedure Plan Follow-up Appointments: Return Appointment in 1 week. - Dr. Heber [place] and Leveda Anna (Room # 7) 09/21/2021 @ 8:00am Bathing/ Shower/ Hygiene: May shower with protection but do not get wound dressing(s) wet. - purchase cast protector from [facility], [facility], [place] Edema Control - Lymphedema / SCD / Other: Elevate legs to the level of the heart or above for 30 minutes daily and/or when sitting, a frequency of: - 2-3 times per day Avoid standing for long periods of time. - limit time standing still, sit when possible, wear compression stockings during day Exercise regularly - light walking WOUND #1: - Lower Leg Wound Laterality: Right, Anterior Cleanser: Soap and Water 1 x Per Week/30 Days Discharge Instructions: May shower and wash wound with dial antibacterial soap and water prior to dressing change. Peri-Wound Care: Sween Lotion (Moisturizing lotion) 1 x Per Week/30  Days Discharge Instructions: Apply moisturizing lotion as directed Prim Dressing: Hydrofera Blue Ready Foam, 4x5 in 1 x Per Week/30 Days ary Discharge Instructions: Apply to wound bed as instructed Prim Dressing: Santyl Ointment 1 x Per Week/30 Days ary Discharge Instructions: Apply nickel thick amount to wound bed as instructed Secondary Dressing: ABD Pad, 8x10 1 x Per Week/30 Days Discharge Instructions: Apply over primary dressing as directed. Com pression Wrap: ThreePress (3 layer compression wrap) 1 x Per Week/30 Days Discharge Instructions: Apply three layer compression as directed. 1. In office sharp debridement 2. Hydrofera Blue and Santyl under 3 layer compression 3. Follow-up in 1 week Electronic Signature(s) Signed: 09/14/2021 10:17:40 AM By: Kalman Shan DO Entered By: Kalman Shan on 09/14/2021 09:48:45 -------------------------------------------------------------------------------- HxROS Details Patient Name: Date of Service: Melissa Bryant [name] 09/14/2021 8:45 A M Medical Record Number: [id number] Patient Account Number: [id number] Date of Birth/Sex: Treating  RN: Sep 22, 1934 (87 y.o. [name] Primary Care Provider: Antony Contras Other Clinician: Referring Provider: Treating Provider/Extender: Olivia Mackie in Treatment: 12 Information Obtained From Patient Eyes Medical History: Past Medical History Notes: cataract surgery both eyes Respiratory Medical History: Positive for: Asthma Cardiovascular Medical History: Positive for: Arrhythmia - A-fib; Hypertension Past Medical History Notes: hypercholesterolemia Psychiatric Medical History: Negative for: Anorexia/bulimia; Confinement Anxiety Immunizations Pneumococcal Vaccine: Received Pneumococcal Vaccination: Yes Received Pneumococcal Vaccination On or After 60th Birthday: Yes Implantable Devices None Hospitalization / Surgery History Type of  Hospitalization/Surgery breast surgery 8 years ago Family and Social History Cancer: Yes - Father; Diabetes: No; Heart Disease: Yes - Father; Hypertension: Yes - Father; Kidney Disease: No; Lung Disease: No; Seizures: No; Stroke: No; Thyroid Problems: No; Tuberculosis: No; Never smoker; Marital Status - Married; Alcohol Use: Never; Drug Use: No History; Caffeine Use: Never; Financial Concerns: No; Food, Clothing or Shelter Needs: No; Support System Lacking: No; Transportation Concerns: No Electronic Signature(s) Signed: 09/14/2021 10:17:40 AM By: Kalman Shan DO Signed: 09/14/2021 4:30:49 PM By: Lorrin Jackson Entered By: Kalman Shan on 09/14/2021 09:47:01 -------------------------------------------------------------------------------- SuperBill Details Patient Name: Date of Service: Melissa Randolph 09/14/2021 Medical Record Number: [id number] Patient Account Number: [id number] Date of Birth/Sex: Treating RN: [birth year] (87 y.o. [name] Primary Care Provider: Antony Contras Other Clinician: Referring Provider: Treating Provider/Extender: Olivia Mackie in Treatment: 12 Diagnosis Coding ICD-10 Codes Code Description [phone] Non-pressure chronic ulcer of unspecified part of right lower leg with fat layer exposed T79.8XXA Other early complications of trauma, initial encounter I48.20 Chronic atrial fibrillation, unspecified Z79.01 Long term (current) use of anticoagulants I10 Essential (primary) hypertension I87.2 Venous insufficiency (chronic) (peripheral) Facility Procedures CPT4 Code: [id number] Description: 34742 - DEB SUBQ TISSUE 20 SQ CM/< ICD-10 Diagnosis Description V95.638 Non-pressure chronic ulcer of unspecified part of right lower leg with fat lay Modifier: er exposed Quantity: 1 Physician Procedures : CPT4 Code Description Modifier [id number] 11042 - WC PHYS SUBQ TISS 20 SQ CM ICD-10 Diagnosis Description R51.884 Non-pressure  chronic ulcer of unspecified part of right lower leg with fat layer exposed Quantity: 1 Electronic Signature(s) Signed: 09/14/2021 10:17:40 AM By: Kalman Shan DO Entered By: Kalman Shan on 09/14/2021 09:49:07

## 2021-09-21 ENCOUNTER — Encounter (HOSPITAL_BASED_OUTPATIENT_CLINIC_OR_DEPARTMENT_OTHER): Payer: Medicare Other | Admitting: Internal Medicine

## 2021-09-21 DIAGNOSIS — I872 Venous insufficiency (chronic) (peripheral): Secondary | ICD-10-CM

## 2021-09-21 DIAGNOSIS — Z7901 Long term (current) use of anticoagulants: Secondary | ICD-10-CM | POA: Diagnosis not present

## 2021-09-21 DIAGNOSIS — I482 Chronic atrial fibrillation, unspecified: Secondary | ICD-10-CM | POA: Diagnosis not present

## 2021-09-21 DIAGNOSIS — T798XXA Other early complications of trauma, initial encounter: Secondary | ICD-10-CM | POA: Diagnosis not present

## 2021-09-21 DIAGNOSIS — L97912 Non-pressure chronic ulcer of unspecified part of right lower leg with fat layer exposed: Secondary | ICD-10-CM | POA: Diagnosis not present

## 2021-09-21 DIAGNOSIS — I10 Essential (primary) hypertension: Secondary | ICD-10-CM | POA: Diagnosis not present

## 2021-09-21 NOTE — Progress Notes (Signed)
Melissa, Randolph ([id number]) Visit Report for 09/21/2021 Chief Complaint Document Details Patient Name: Date of Service: Melissa Randolph 09/21/2021 8:45 A M Medical Record Number: [id number] Patient Account Number: [id number] Date of Birth/Sex: Treating RN: 10-28-1934 (87 y.o. Melissa Randolph Primary Care Provider: Antony Contras Other Clinician: Referring Provider: Treating Provider/Extender: Olivia Mackie in Treatment: 13 Information Obtained from: Patient Chief Complaint 06/22/2021; Right lower extremity wound following trauma Electronic Signature(s) Signed: 09/21/2021 9:29:49 AM By: Kalman Shan DO Entered By: Kalman Shan on 09/21/2021 09:24:52 -------------------------------------------------------------------------------- HPI Details Patient Name: Date of Service: Melissa Leach C. 09/21/2021 8:45 A M Medical Record Number: [id number] Patient Account Number: [id number] Date of Birth/Sex: Treating RN: 10/10/1934 (87 y.o. Melissa Randolph Primary Care Provider: Antony Contras Other Clinician: Referring Provider: Treating Provider/Extender: Olivia Mackie in Treatment: 13 History of Present Illness HPI Description: Admission 06/22/2021 [name] is an 87 year old female with a past medical history of A-fib on Eliquis that presents to the clinic for a wound to her right lower extremity. She fell over a wooden baseboard on 4/13. She went to the ED for evaluation and she had 15 stitches placed. She had an x-ray of her leg that showed no acute osseous abnormality. She has been placing Xeroform over the wound bed intermittently. She visited the ED again on 4/21 because of wound infection. She was started on doxycycline and cefadroxil. She states she has a couple days remaining. She currently denies systemic signs of infection. 5/8; patient presents for follow-up. She has been using bacitracin and Xeroform to the  wound bed. She reports significant improvement in her pain to the wound bed. She states she is using her compression stockings daily. She denies signs of infection. 5/15; patient presents for follow-up. She has been using Iodoflex to the wound bed. She has no issues or complaints today. She has been using her compression stocking daily. She went to vein and vascular to have her ABIs with TBI's completed. On the right she had an ABI of 1.17 and TBI of 0.68. 5/22; patient presents for follow-up. We switched to [facility] and antibiotic ointment under a compression wrap at last clinic visit. She tolerated the wrap well. She has no issues or complaints today. She denies signs of infection. 5/30; patient presents for follow-up. We have been using Hydrofera Blue and antibiotic under compression. The Hydrofera Blue was stuck on tightly and had moved out of place under the wrap. She currently denies signs of infection. She is tolerating the compression wrap well. 6/6; patient changed to collagen last week also using Santyl. Dimensions are a lot better this week. She was also put under compression last week with 3 layer 6/13; patient presents for follow-up. We have been using collagen and Santyl under 3 layer compression. She continues to tolerate this well. She has no issues or complaints today. She denies signs of infection. 6/19; patient presents for follow-up. We have been using collagen and Santyl under 3 layer compression. She has no issues or complaints today. 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 She has been more active. She currently denies signs of infection. 6/26; patient presents for follow-up. She was switched to PolyMem silver under 3 layer compression at last clinic visit. There is slightly more maceration to the periwound however the wound itself looks improved. She has no issues or complaints today. 7/10; patient presents for follow-up. We have  been  using Hydrofera Blue under 3 layer compression. She has no issues or complaints today. She had a nurse visit at last clinic visit for wrap change. 7/17; patient presents for follow-up. We have been using Hydrofera Blue under 3 layer compression. She has no issues or complaints today. She denies signs of infection. 7/24; Patient presents for follow-up. We have been using Hydrofera Blue and Santyl under 3 layer compression. Patient tolerated this well. She has no issues or complaints today. 7/31; patient presents for follow-up. We continue to use Hydrofera Blue and Santyl under 3 layer compression. Patient has no issues or complaints today. Electronic Signature(s) Signed: 09/21/2021 9:29:49 AM By: Kalman Shan DO Entered By: Kalman Shan on 09/21/2021 09:25:13 -------------------------------------------------------------------------------- Physical Exam Details Patient Name: Date of Service: Melissa Randolph 09/21/2021 8:45 A M Medical Record Number: [id number] Patient Account Number: [id number] Date of Birth/Sex: Treating RN: 04/11/1934 (87 y.o. Melissa Randolph Primary Care Provider: Antony Contras Other Clinician: Referring Provider: Treating Provider/Extender: Olivia Mackie in Treatment: 13 Constitutional respirations regular, non-labored and within target range for patient.. Cardiovascular 2+ dorsalis pedis/posterior tibialis pulses. Psychiatric pleasant and cooperative. Notes Right lower extremity: Small open wound to the anterior aspect with granulation tissue. No surrounding signs of infection. Good edema control. Electronic Signature(s) Signed: 09/21/2021 9:29:49 AM By: Kalman Shan DO Entered By: Kalman Shan on 09/21/2021 09:27:22 -------------------------------------------------------------------------------- Physician Orders Details Patient Name: Date of Service: Melissa Leach C. 09/21/2021 8:45 A M Medical Record Number:  [id number] Patient Account Number: [id number] Date of Birth/Sex: Treating RN: September 28, 1934 (87 y.o. 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 - Dr. Heber [place] and Leveda Anna (Room # 7) 09/28/2021 @ 8:45am Return A Bathing/ Shower/ Hygiene May shower with protection but do not get wound dressing(s) wet. - purchase cast protector from [facility], [facility], [employer] Edema Control - Lymphedema / SCD / Other Elevate legs to the level of the heart or above for 30 minutes daily and/or when sitting, a frequency of: - 2-3 times per day void standing for long periods of time. - limit time standing still, sit when possible, wear compression stockings during day A Exercise regularly - light walking Wound Treatment Wound #1 - Lower Leg Wound Laterality: Right, Anterior Cleanser: Soap and Water 1 x Per Week/30 Days Discharge Instructions: May shower and wash wound with dial antibacterial soap and water prior to dressing change. Peri-Wound Care: Sween Lotion (Moisturizing lotion) 1 x Per Week/30 Days Discharge Instructions: Apply moisturizing lotion as directed Prim Dressing: Hydrofera Blue Ready Foam, 4x5 in 1 x Per Week/30 Days ary Discharge Instructions: Apply to wound bed as instructed Secondary Dressing: ABD Pad, 8x10 1 x Per Week/30 Days Discharge Instructions: Apply over primary dressing as directed. Compression Wrap:  ThreePress (3 layer compression wrap) 1 x Per Week/30 Days Discharge Instructions: Apply three layer compression as directed. Electronic Signature(s) Signed: 09/21/2021 9:29:49 AM By: Kalman Shan DO Entered By: Kalman Shan on 09/21/2021 09:27:28 -------------------------------------------------------------------------------- Problem List Details Patient Name: Date of Service: Melissa Randolph, Melissa [name] 09/21/2021 8:45  A M Medical Record Number: [id number] Patient Account Number: [id number] Date of Birth/Sex: Treating RN: 11-15-1934 (87 y.o. Melissa Randolph Primary Care Provider: Antony Contras Other Clinician: Referring Provider: Treating Provider/Extender: Olivia Mackie in Treatment: 13 Active Problems ICD-10 Encounter Code Description Active Date MDM Diagnosis [phone] Non-pressure chronic ulcer of unspecified part of right lower leg with fat layer 06/22/2021 No Yes exposed T79.8XXA Other early complications of trauma, initial encounter 06/22/2021 No Yes I48.20 Chronic atrial fibrillation, unspecified 06/22/2021 No Yes Z79.01 Long term (current) use of anticoagulants 06/22/2021 No Yes I10 Essential (primary) hypertension 06/22/2021 No Yes I87.2 Venous insufficiency (chronic) (peripheral) 07/06/2021 No Yes Inactive Problems Resolved Problems Electronic Signature(s) Signed: 09/21/2021 9:29:49 AM By: Kalman Shan DO Entered By: Kalman Shan on 09/21/2021 09:24:43 -------------------------------------------------------------------------------- Progress Note Details Patient Name: Date of Service: Melissa Leach C. 09/21/2021 8:45 A M Medical Record Number: [id number] Patient Account Number: [id number] Date of Birth/Sex: Treating RN: 10/10/1934 (87 y.o. Melissa Randolph Primary Care Provider: Antony Contras Other Clinician: Referring Provider: Treating Provider/Extender: Olivia Mackie in Treatment: 13 Subjective Chief  Complaint Information obtained from Patient 06/22/2021; Right lower extremity wound following trauma History of Present Illness (HPI) Admission 06/22/2021 Ms. Emersen Mascari is an 88 year old female with a past medical history of A-fib on Eliquis that presents to the clinic for a wound to her right lower extremity. She fell over a wooden baseboard on 4/13. She went to the ED for evaluation and she had 15 stitches placed. She had an x-ray of her leg that showed no acute osseous abnormality. She has been placing Xeroform over the wound bed intermittently. She visited the ED again on 4/21 because of wound infection. She was started on doxycycline and cefadroxil. She states she has a couple days remaining. She currently denies systemic signs of infection. 5/8; patient presents for follow-up. She has been using bacitracin and Xeroform to the wound bed. She reports significant improvement in her pain to the wound bed. She states she is using her compression stockings daily. She denies signs of infection. 5/15; patient presents for follow-up. She has been using Iodoflex to the wound bed. She has no issues or complaints today. She has been using her compression stocking daily. She went to vein and vascular to have her ABIs with TBI's completed. On the right she had an ABI of 1.17 and TBI of 0.68. 5/22; patient presents for follow-up. We switched to [facility] and antibiotic ointment under a compression wrap at last clinic visit. She tolerated the wrap well. She has no issues or complaints today. She denies signs of infection. 5/30; patient presents for follow-up. We have been using Hydrofera Blue and antibiotic under compression. The Hydrofera Blue was stuck on tightly and had moved out of place under the wrap. She currently denies signs of infection. She is tolerating the compression wrap well. 6/6; patient changed to collagen last week also using Santyl. Dimensions are a lot better this week. She was  also put under compression last week with 3 layer 6/13; patient presents for follow-up. We have been using collagen and Santyl under 3 layer compression. She continues to tolerate this well. She has no issues or complaints today. She denies signs of infection. 6/19; patient presents for follow-up. We have been using collagen and Santyl under 3 layer compression. She has no issues or complaints today. 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 She has been more  active. She currently denies signs of infection. 6/26; patient presents for follow-up. She was switched to PolyMem silver under 3 layer compression at last clinic visit. There is slightly more maceration to the periwound however the wound itself looks improved. She has no issues or complaints today. 7/10; patient presents for follow-up. We have been using Hydrofera Blue under 3 layer compression. She has no issues or complaints today. She had a nurse visit at last clinic visit for wrap change. 7/17; patient presents for follow-up. We have been using Hydrofera Blue under 3 layer compression. She has no issues or complaints today. She denies signs of infection. 7/24; Patient presents for follow-up. We have been using Hydrofera Blue and Santyl under 3 layer compression. Patient tolerated this well. She has no issues or complaints today. 7/31; patient presents for follow-up. We continue to use Hydrofera Blue and Santyl under 3 layer compression. Patient has no issues or complaints today. Patient History Information obtained from Patient. Family History Cancer - Father, Heart Disease - Father, Hypertension - Father, No family history of Diabetes, Kidney Disease, Lung Disease, Seizures, Stroke, Thyroid Problems, Tuberculosis. Social History Never smoker, Marital Status - Married, Alcohol Use - Never, Drug Use - No History, Caffeine Use - Never. Medical History Respiratory Patient has history of  Asthma Cardiovascular Patient has history of Arrhythmia - A-fib, Hypertension Psychiatric Denies history of Anorexia/bulimia, Confinement Anxiety Hospitalization/Surgery History - breast surgery 8 years ago. Medical A Surgical History Notes nd Eyes cataract surgery both eyes Cardiovascular hypercholesterolemia Objective Constitutional respirations regular, non-labored and within target range for patient.. Vitals Time Taken: 8:49 AM, Weight: 118 lbs, Temperature: 98 F, Pulse: 70 bpm, Respiratory Rate: 18 breaths/min, Blood Pressure: 147/78 mmHg. Cardiovascular 2+ dorsalis pedis/posterior tibialis pulses. Psychiatric pleasant and cooperative. General Notes: Right lower extremity: Small open wound to the anterior aspect with granulation tissue. No surrounding signs of infection. Good edema control. Integumentary (Hair, Skin) Wound #1 status is Open. Original cause of wound was Trauma. The date acquired was: 06/04/2021. The wound has been in treatment 13 weeks. The wound is located on the Right,Anterior Lower Leg. The wound measures 0.2cm length x 0.5cm width x 0.1cm depth; 0.079cm^2 area and 0.008cm^3 volume. There is Fat Layer (Subcutaneous Tissue) exposed. There is no tunneling or undermining noted. There is a medium amount of serosanguineous drainage noted. The wound margin is distinct with the outline attached to the wound base. There is large (67-100%) red, pink granulation within the wound bed. There is a small (1-33%) amount of necrotic tissue within the wound bed including Adherent Slough. 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 It appears well-healing. I recommended continuing with Hydrofera Blue under compression therapy. I am hopeful that will be healed at next clinic visit. Procedures Wound #1 Pre-procedure diagnosis of Wound #1 is a Trauma, Other located on the Right,Anterior Lower Leg . There was a Three Layer Compression Therapy Procedure by Lorrin Jackson, RN. Post procedure Diagnosis Wound #1: Same as Pre-Procedure Plan Follow-up Appointments: Return Appointment in 1 week. - Dr. Heber  and Leveda Anna (Room # 7) 09/28/2021 @ 8:45am Bathing/ Shower/ Hygiene: May shower with protection but do not get wound dressing(s) wet. - purchase cast protector from [facility], [facility], [place] Edema  Control - Lymphedema / SCD / Other: Elevate legs to the level of the heart or above for 30 minutes daily and/or when sitting, a frequency of: - 2-3 times per day Avoid standing for long periods of time. - limit time standing still, sit when possible, wear compression stockings during day Exercise regularly - light walking WOUND #1: - Lower Leg Wound Laterality: Right, Anterior Cleanser: Soap and Water 1 x Per Week/30 Days Discharge Instructions: May shower and wash wound with dial antibacterial soap and water prior to dressing change. Peri-Wound Care: Sween Lotion (Moisturizing lotion) 1 x Per Week/30 Days Discharge Instructions: Apply moisturizing lotion as directed Prim Dressing: Hydrofera Blue Ready Foam, 4x5 in 1 x Per Week/30 Days ary Discharge Instructions: Apply to wound bed as instructed Secondary Dressing: ABD Pad, 8x10 1 x Per Week/30 Days Discharge Instructions: Apply over primary dressing as directed. Com pression Wrap: ThreePress (3 layer compression wrap) 1 x Per Week/30 Days Discharge Instructions: Apply three layer compression as directed. 1. Hydrofera Blue under 3 layer compression 2. Follow-up in 1 week Electronic Signature(s) Signed: 09/21/2021 9:29:49 AM By: Kalman Shan  DO Entered By: Kalman Shan on 09/21/2021 09:28:00 -------------------------------------------------------------------------------- HxROS Details Patient Name: Date of Service: Melissa Leach C. 09/21/2021 8:45 A M Medical Record Number: [id number] Patient Account Number: [id number] Date of Birth/Sex: Treating RN: November 20, 1934 (87 y.o. Melissa Randolph Primary Care Provider: Antony Contras Other Clinician: Referring Provider: Treating Provider/Extender: Olivia Mackie in Treatment: 13 Information Obtained From Patient Eyes Medical History: Past Medical History Notes: cataract surgery both eyes Respiratory Medical History: Positive for: Asthma Cardiovascular Medical History: Positive for: Arrhythmia - A-fib; Hypertension Past Medical History Notes: hypercholesterolemia Psychiatric Medical History: Negative for: Anorexia/bulimia; Confinement Anxiety Immunizations Pneumococcal Vaccine: Received Pneumococcal Vaccination: Yes Received Pneumococcal Vaccination On or After 60th Birthday: Yes Implantable Devices None Hospitalization / Surgery History Type of Hospitalization/Surgery breast surgery 8 years ago Family and Social History Cancer: Yes - Father; Diabetes: No; Heart Disease: Yes - Father; Hypertension: Yes - Father; Kidney Disease: No; Lung Disease: No; Seizures: No; Stroke: No; Thyroid Problems: No; Tuberculosis: No; Never smoker; Marital Status - Married; Alcohol Use: Never; Drug Use: No History; Caffeine Use: Never; Financial Concerns: No; Food, Clothing or Shelter Needs: No; Support System Lacking: No; Transportation Concerns: No Electronic Signature(s) Signed: 09/21/2021 9:29:49 AM By: Kalman Shan DO Signed: 09/21/2021 4:16:51 PM By: Lorrin Jackson Entered By: Kalman Shan on 09/21/2021 09:25:19 -------------------------------------------------------------------------------- [place] Details Patient Name: Date of  Service: Melissa Randolph 09/21/2021 Medical Record Number: [id number] Patient Account Number: [id number] Date of Birth/Sex: Treating RN: Oct 20, 1934 (87 y.o. 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 T79.8XXA Other early complications of trauma, initial encounter I48.20 Chronic atrial fibrillation, unspecified Z79.01 Long term (current) use of anticoagulants I10 Essential (primary) hypertension I87.2 Venous insufficiency (chronic) (peripheral) Facility Procedures CPT4 Code: [id number] Description: (Facility Use Only) [phone] - [place] LWR RT LEG ICD-10 Diagnosis Description D17.616 Non-pressure chronic ulcer of unspecified part of right lower leg with fat layer exp Modifier: osed Quantity: 1 Physician Procedures Electronic Signature(s) Signed: 09/21/2021 9:29:49 AM By: Kalman Shan DO Entered By: Kalman Shan on 09/21/2021 07:37:10

## 2021-09-21 NOTE — Progress Notes (Signed)
SELEN, SMUCKER ([id number]) Visit Report for 09/21/2021 Arrival Information Details Patient Name: Date of Service: [name] 09/21/2021 8:45 A M Medical Record Number: [id number] Patient Account Number: [id number] Date of Birth/Sex: Treating RN: Jun 28, 1934 (87 y.o. [name] Primary Care Tzipporah Nagorski: Antony Contras Other Clinician: Referring Jillaine Waren: Treating Omaira Mellen/Extender: Olivia Mackie in Treatment: 82 Visit Information History Since Last Visit Added or deleted any medications: No Patient Arrived: Ambulatory Any new allergies or adverse reactions: No Arrival Time: 08:45 Had a fall or experienced change in No Accompanied By: self activities of daily living that may affect Transfer Assistance: None risk of falls: Patient Identification Verified: Yes Signs or symptoms of abuse/neglect since last visito No Secondary Verification Process Completed: Yes Hospitalized since last visit: No Patient Requires Transmission-Based Precautions: No Implantable device outside of the clinic excluding No Patient Has Alerts: Yes cellular tissue based products placed in the center Patient Alerts: Patient on Blood Thinner since last visit: Eliquis Has Dressing in Place as Prescribed: Yes Has Compression in Place as Prescribed: Yes Pain Present Now: No Electronic Signature(s) Signed: 09/21/2021 4:28:59 PM By: Erenest Blank Entered By: Erenest Blank on 09/21/2021 08:46:40 -------------------------------------------------------------------------------- Compression Therapy Details Patient Name: Date of Service: [name] 09/21/2021 8:45 A M Medical Record Number: [id number] Patient Account Number: [id number] Date of Birth/Sex: Treating RN: December 16, 1934 (87 y.o. [name] Primary Care Vitalia Stough: Antony Contras Other Clinician: Referring Ashlei Chinchilla: Treating Brixon Zhen/Extender: Olivia Mackie in Treatment:  13 Compression Therapy Performed for Wound Assessment: Wound #1 Right,Anterior Lower Leg Performed By: Clinician Lorrin Jackson, RN Compression Type: Three Layer Post Procedure Diagnosis Same as Pre-procedure Electronic Signature(s) Signed: 09/21/2021 4:28:59 PM By: Erenest Blank Entered By: Erenest Blank on 09/21/2021 09:19:15 -------------------------------------------------------------------------------- Encounter Discharge Information Details Patient Name: Date of Service: [name] 09/21/2021 8:45 A M Medical Record Number: [id number] Patient Account Number: [id number] Date of Birth/Sex: Treating RN: 08-Nov-1934 (87 y.o. [name] Primary Care Zhara Gieske: Antony Contras Other Clinician: Referring Kimberl Vig: Treating Sly Parlee/Extender: Olivia Mackie in Treatment: 13 Encounter Discharge Information Items Discharge Condition: Stable Ambulatory Status: Ambulatory Discharge Destination: Home Transportation: Private Auto Schedule Follow-up Appointment: Yes Clinical Summary of Care: Provided on 09/21/2021 Form Type Recipient Paper Patient Patient Electronic Signature(s) Signed: 09/21/2021 4:28:59 PM By: Erenest Blank Entered By: Erenest Blank on 09/21/2021 09:34:54 -------------------------------------------------------------------------------- Lower Extremity Assessment Details Patient Name: Date of Service: [name] 09/21/2021 8:45 A M Medical Record Number: [id number] Patient Account Number: [id number] Date of Birth/Sex: Treating RN: [birth year] (87 y.o. [name] Primary Care Cumi Sanagustin: Antony Contras Other Clinician: Referring Raimi Guillermo: Treating Berlin Mokry/Extender: Olivia Mackie in Treatment: 13 Edema Assessment Assessed: [name]: No] [name]: Yes] Edema: [Left: N] [Right: o] Calf Left: Right: Point of Measurement: 28 cm From Medial Instep 32 cm Ankle Left: Right: Point of  Measurement: 10 cm From Medial Instep 20 cm Vascular Assessment Pulses: Dorsalis Pedis Palpable: [Right:Yes] Electronic Signature(s) Signed: 09/21/2021 4:16:51 PM By: Lorrin Jackson Signed: 09/21/2021 4:28:59 PM By: Erenest Blank Entered By: Erenest Blank on 09/21/2021 09:03:23 -------------------------------------------------------------------------------- Multi Wound Chart Details Patient Name: Date of Service: [name] 09/21/2021 8:45 A M Medical Record Number: [id number] Patient Account Number: [id number] Date of Birth/Sex: Treating RN: October 25, 1934 (87 y.o. [name] Primary Care Kathryne Ramella: Antony Contras Other Clinician: Referring Vicie Cech: Treating Illene Sweeting/Extender: Olivia Mackie in Treatment: 13 Vital Signs Height(in): Pulse(bpm): 70 Weight(lbs): 118  Blood Pressure(mmHg): 147/78 Body Mass Index(BMI): Temperature(F): 98 Respiratory Rate(breaths/min): 18 Photos: [N/A:N/A] Right, Anterior Lower Leg N/A N/A Wound Location: Trauma N/A N/A Wounding Event: Trauma, Other N/A N/A Primary Etiology: Asthma, Arrhythmia, Hypertension N/A N/A Comorbid History: 06/04/2021 N/A N/A Date Acquired: 55 N/A N/A Weeks of Treatment: Open N/A N/A Wound Status: No N/A N/A Wound Recurrence: 0.2x0.5x0.1 N/A N/A Measurements L x W x D (cm) 0.079 N/A N/A A (cm) : rea 0.008 N/A N/A Volume (cm) : 99.90% N/A N/A % Reduction in A rea: 99.90% N/A N/A % Reduction in Volume: Full Thickness Without Exposed N/A N/A Classification: Support Structures Medium N/A N/A Exudate Amount: Serosanguineous N/A N/A Exudate Type: red, brown N/A N/A Exudate Color: Distinct, outline attached N/A N/A Wound Margin: Large (67-100%) N/A N/A Granulation Amount: Red, Pink N/A N/A Granulation Quality: Small (1-33%) N/A N/A Necrotic Amount: Fat Layer (Subcutaneous Tissue): Yes N/A N/A Exposed Structures: Fascia: No Tendon: No Muscle: No Joint:  No Bone: No Large (67-100%) N/A N/A Epithelialization: Compression Therapy N/A N/A Procedures Performed: Treatment Notes Electronic Signature(s) Signed: 09/21/2021 9:29:49 AM By: Kalman Shan DO Signed: 09/21/2021 4:16:51 PM By: Lorrin Jackson Entered By: Kalman Shan on 09/21/2021 09:24:47 -------------------------------------------------------------------------------- Multi-Disciplinary Care Plan Details Patient Name: Date of Service: [name] 09/21/2021 8:45 A M Medical Record Number: [id number] Patient Account Number: [id number] Date of Birth/Sex: Treating RN: 1934-04-27 (87 y.o. [name] Primary Care Shuan Statzer: Antony Contras Other Clinician: Referring Sharlette Jansma: Treating Izsak Meir/Extender: Olivia Mackie in Treatment: 13 Active Inactive Wound/Skin Impairment Nursing Diagnoses: Impaired tissue integrity Goals: Patient/caregiver will verbalize understanding of skin care regimen Date Initiated: 06/22/2021 Date Inactivated: 07/06/2021 Target Resolution Date: 07/25/2021 Goal Status: Met Ulcer/skin breakdown will have a volume reduction of 30% by week 4 Date Initiated: 06/22/2021 Date Inactivated: 08/31/2021 Target Resolution Date: 08/22/2021 Goal Status: Met Ulcer/skin breakdown will have a volume reduction of 50% by week 8 Date Initiated: 08/31/2021 Date Inactivated: 09/14/2021 Target Resolution Date: 09/14/2021 Goal Status: Met Ulcer/skin breakdown will have a volume reduction of 80% by week 12 Date Initiated: 09/14/2021 Target Resolution Date: 10/12/2021 Goal Status: Active Interventions: Assess ulceration(s) every visit Provide education on ulcer and skin care Notes: Electronic Signature(s) Signed: 09/21/2021 4:16:51 PM By: Lorrin Jackson Signed: 09/21/2021 4:28:59 PM By: Erenest Blank Entered By: Erenest Blank on 09/21/2021 09:03:49 -------------------------------------------------------------------------------- Pain  Assessment Details Patient Name: Date of Service: [name] 09/21/2021 8:45 A M Medical Record Number: [id number] Patient Account Number: [id number] Date of Birth/Sex: Treating RN: 1934/12/24 (87 y.o. [name] Primary Care Nannette Zill: Antony Contras Other Clinician: Referring Rosalina Dingwall: Treating Dylann Layne/Extender: Olivia Mackie in Treatment: 13 Active Problems Location of Pain Severity and Description of Pain Patient Has Paino No Site Locations Pain Management and Medication Current Pain Management: Electronic Signature(s) Signed: 09/21/2021 4:16:51 PM By: Lorrin Jackson Signed: 09/21/2021 4:28:59 PM By: Erenest Blank Entered By: Erenest Blank on 09/21/2021 08:49:32 -------------------------------------------------------------------------------- Patient/Caregiver Education Details Patient Name: Date of Service: [name] 7/31/2023andnbsp8:45 A M Medical Record Number: [id number] Patient Account Number: [id number] Date of Birth/Gender: Treating RN: October 14, 1934 (87 y.o. [name] Primary Care Physician: Antony Contras Other Clinician: Referring Physician: Treating Physician/Extender: Olivia Mackie in Treatment: 13 Education Assessment Education Provided To: Patient Education Topics Provided Venous: Methods: Explain/Verbal, Printed Responses: State content correctly Wound/Skin Impairment: Methods: Explain/Verbal, Printed Responses: State content correctly Electronic Signature(s) Signed: 09/21/2021 4:28:59 PM By: Erenest Blank Entered By: Erenest Blank on 09/21/2021  09:04:31 -------------------------------------------------------------------------------- Wound Assessment Details Patient Name: Date of Service: [name] 09/21/2021 8:45 A M Medical Record Number: [id number] Patient Account Number: [id number] Date of Birth/Sex: Treating RN: 1934-09-05 (87 y.o. [name] Primary Care Leotis Isham: Antony Contras Other Clinician: Referring Kei Mcelhiney: Treating Ariday Brinker/Extender: Olivia Mackie in Treatment: 13 Wound Status Wound Number: 1 Primary Etiology: Trauma, Other Wound Location: Right, Anterior Lower Leg Wound Status: Open Wounding Event: Trauma Comorbid History: Asthma, Arrhythmia, Hypertension Date Acquired: 06/04/2021 Weeks Of Treatment: 13 Clustered Wound: No Photos Wound Measurements Length: (cm) 0.2 Width: (cm) 0.5 Depth: (cm) 0.1 Area: (cm) 0.079 Volume: (cm) 0.008 % Reduction in Area: 99.9% % Reduction in Volume: 99.9% Epithelialization: Large (67-100%) Tunneling: No Undermining: No Wound Description Classification: Full Thickness Without Exposed Support Structures Wound Margin: Distinct, outline attached Exudate Amount: Medium Exudate Type: Serosanguineous Exudate Color: red, brown Foul Odor After Cleansing: No Slough/Fibrino Yes Wound Bed Granulation Amount: Large (67-100%) Exposed Structure Granulation Quality: Red, Pink Fascia Exposed: No Necrotic Amount: Small (1-33%) Fat Layer (Subcutaneous Tissue) Exposed: Yes Necrotic Quality: Adherent Slough Tendon Exposed: No Muscle Exposed: No Joint Exposed: No Bone Exposed: No Treatment Notes Wound #1 (Lower Leg) Wound Laterality: Right, Anterior Cleanser Soap and Water Discharge Instruction: May shower and wash wound with dial antibacterial soap and water prior to dressing change. Peri-Wound Care Sween Lotion (Moisturizing lotion) Discharge Instruction: Apply moisturizing lotion as directed Topical Primary Dressing Hydrofera Blue Ready Foam, 4x5 in Discharge Instruction: Apply to wound bed as instructed Secondary Dressing ABD Pad, 8x10 Discharge Instruction: Apply over primary dressing as directed. Secured With Compression Wrap ThreePress (3 layer compression wrap) Discharge Instruction: Apply three layer compression as  directed. Compression Stockings Add-Ons Electronic Signature(s) Signed: 09/21/2021 4:16:51 PM By: Lorrin Jackson Signed: 09/21/2021 4:28:59 PM By: Erenest Blank Entered By: Erenest Blank on 09/21/2021 09:03:57 -------------------------------------------------------------------------------- Vitals Details Patient Name: Date of Service: [name] 09/21/2021 8:45 A M Medical Record Number: [id number] Patient Account Number: [id number] Date of Birth/Sex: Treating RN: 01/26/1935 (87 y.o. [name] Primary Care Mikeria Valin: Antony Contras Other Clinician: Referring Harbor Paster: Treating Lavi Sheehan/Extender: Olivia Mackie in Treatment: 13 Vital Signs Time Taken: 08:49 Temperature (F): 98 Weight (lbs): 118 Pulse (bpm): 70 Respiratory Rate (breaths/min): 18 Blood Pressure (mmHg): 147/78 Reference Range: 80 - 120 mg / dl Electronic Signature(s) Signed: 09/21/2021 4:28:59 PM By: Erenest Blank Entered By: Erenest Blank on 09/21/2021 08:49:25

## 2021-09-28 ENCOUNTER — Encounter (HOSPITAL_BASED_OUTPATIENT_CLINIC_OR_DEPARTMENT_OTHER): Payer: Medicare Other | Attending: Internal Medicine | Admitting: Internal Medicine

## 2021-09-28 DIAGNOSIS — W010XXA Fall on same level from slipping, tripping and stumbling without subsequent striking against object, initial encounter: Secondary | ICD-10-CM | POA: Diagnosis not present

## 2021-09-28 DIAGNOSIS — I482 Chronic atrial fibrillation, unspecified: Secondary | ICD-10-CM | POA: Diagnosis not present

## 2021-09-28 DIAGNOSIS — I872 Venous insufficiency (chronic) (peripheral): Secondary | ICD-10-CM | POA: Insufficient documentation

## 2021-09-28 DIAGNOSIS — T798XXA Other early complications of trauma, initial encounter: Secondary | ICD-10-CM | POA: Insufficient documentation

## 2021-09-28 DIAGNOSIS — I10 Essential (primary) hypertension: Secondary | ICD-10-CM | POA: Insufficient documentation

## 2021-09-28 DIAGNOSIS — Z7901 Long term (current) use of anticoagulants: Secondary | ICD-10-CM | POA: Insufficient documentation

## 2021-09-28 DIAGNOSIS — L97912 Non-pressure chronic ulcer of unspecified part of right lower leg with fat layer exposed: Secondary | ICD-10-CM | POA: Diagnosis not present

## 2021-09-28 NOTE — Progress Notes (Addendum)
Melissa Randolph, Melissa Randolph ([id number]) Visit Report for 09/28/2021 Arrival Information Details Patient Name: Date of Service: Melissa Randolph 09/28/2021 8:45 A M Medical Record Number: [id number] Patient Account Number: [id number] Date of Birth/Sex: Treating RN: 1934/07/12 (87 y.o. [name] Primary Care Hisako Bugh: Antony Contras Other Clinician: Referring Malayja Freund: Treating Amyla Heffner/Extender: Olivia Mackie in Treatment: 14 Visit Information History Since Last Visit Added or deleted any medications: No Patient Arrived: Ambulatory Any new allergies or adverse reactions: No Arrival Time: 08:49 Had a fall or experienced change in No Transfer Assistance: None activities of daily living that may affect Patient Identification Verified: Yes risk of falls: Secondary Verification Process Completed: Yes Signs or symptoms of abuse/neglect since last visito No Patient Requires Transmission-Based Precautions: No Hospitalized since last visit: No Patient Has Alerts: Yes Implantable device outside of the clinic excluding No Patient Alerts: Patient on Blood Thinner cellular tissue based products placed in the center Eliquis since last visit: Has Dressing in Place as Prescribed: Yes Has Compression in Place as Prescribed: Yes Pain Present Now: No Electronic Signature(s) Signed: 09/28/2021 4:32:44 PM By: Lorrin Jackson Entered By: Lorrin Jackson on 09/28/2021 08:49:54 -------------------------------------------------------------------------------- Clinic Level of Care Assessment Details Patient Name: Date of Service: Melissa Randolph 09/28/2021 8:45 A M Medical Record Number: [id number] Patient Account Number: [id number] Date of Birth/Sex: Treating RN: May 29, 1934 (87 y.o. [name] Primary Care Holly Pring: Antony Contras Other Clinician: Referring Nolin Grell: Treating Haiden Clucas/Extender: Olivia Mackie in Treatment: 14 Clinic Level of  Care Assessment Items TOOL 4 Quantity Score X- 1 0 Use when only an EandM is performed on FOLLOW-UP visit ASSESSMENTS - Nursing Assessment / Reassessment X- 1 10 Reassessment of Co-morbidities (includes updates in patient status) X- 1 5 Reassessment of Adherence to Treatment Plan ASSESSMENTS - Wound and Skin A ssessment / Reassessment X - Simple Wound Assessment / Reassessment - one wound 1 5 '[]'$  - 0 Complex Wound Assessment / Reassessment - multiple wounds '[]'$  - 0 Dermatologic / Skin Assessment (not related to wound area) ASSESSMENTS - Focused Assessment '[]'$  - 0 Circumferential Edema Measurements - multi extremities '[]'$  - 0 Nutritional Assessment / Counseling / Intervention '[]'$  - 0 Lower Extremity Assessment (monofilament, tuning fork, pulses) '[]'$  - 0 Peripheral Arterial Disease Assessment (using hand held doppler) ASSESSMENTS - Ostomy and/or Continence Assessment and Care '[]'$  - 0 Incontinence Assessment and Management '[]'$  - 0 Ostomy Care Assessment and Management (repouching, etc.) PROCESS - Coordination of Care X - Simple Patient / Family Education for ongoing care 1 15 '[]'$  - 0 Complex (extensive) Patient / Family Education for ongoing care '[]'$  - 0 Staff obtains Programmer, systems, Records, T Results / Process Orders est '[]'$  - 0 Staff telephones HHA, Nursing Homes / Clarify orders / etc '[]'$  - 0 Routine Transfer to another Facility (non-emergent condition) '[]'$  - 0 Routine Hospital Admission (non-emergent condition) '[]'$  - 0 New Admissions / Biomedical engineer / Ordering NPWT Apligraf, etc. , '[]'$  - 0 Emergency Hospital Admission (emergent condition) '[]'$  - 0 Simple Discharge Coordination '[]'$  - 0 Complex (extensive) Discharge Coordination PROCESS - Special Needs '[]'$  - 0 Pediatric / Minor Patient Management '[]'$  - 0 Isolation Patient Management '[]'$  - 0 Hearing / Language / Visual special needs '[]'$  - 0 Assessment of Community assistance (transportation, D/C planning, etc.) '[]'$  -  0 Additional assistance / Altered mentation '[]'$  - 0 Support Surface(s) Assessment (bed, cushion, seat, etc.) INTERVENTIONS - Wound Cleansing / Measurement '[]'$  - 0 Simple Wound Cleansing -  one wound '[]'$  - 0 Complex Wound Cleansing - multiple wounds X- 1 5 Wound Imaging (photographs - any number of wounds) '[]'$  - 0 Wound Tracing (instead of photographs) '[]'$  - 0 Simple Wound Measurement - one wound '[]'$  - 0 Complex Wound Measurement - multiple wounds INTERVENTIONS - Wound Dressings '[]'$  - 0 Small Wound Dressing one or multiple wounds '[]'$  - 0 Medium Wound Dressing one or multiple wounds '[]'$  - 0 Large Wound Dressing one or multiple wounds '[]'$  - 0 Application of Medications - topical '[]'$  - 0 Application of Medications - injection INTERVENTIONS - Miscellaneous '[]'$  - 0 External ear exam '[]'$  - 0 Specimen Collection (cultures, biopsies, blood, body fluids, etc.) '[]'$  - 0 Specimen(s) / Culture(s) sent or taken to Lab for analysis '[]'$  - 0 Patient Transfer (multiple staff / Civil Service fast streamer / Similar devices) '[]'$  - 0 Simple Staple / Suture removal (25 or less) '[]'$  - 0 Complex Staple / Suture removal (26 or more) '[]'$  - 0 Hypo / Hyperglycemic Management (close monitor of Blood Glucose) '[]'$  - 0 Ankle / Brachial Index (ABI) - do not check if billed separately X- 1 5 Vital Signs Has the patient been seen at the hospital within the last three years: Yes Total Score: 45 Level Of Care: New/Established - Level 2 Electronic Signature(s) Signed: 09/28/2021 4:32:44 PM By: Lorrin Jackson Entered By: Lorrin Jackson on 09/28/2021 09:06:14 -------------------------------------------------------------------------------- Encounter Discharge Information Details Patient Name: Date of Service: Melissa Leach C. 09/28/2021 8:45 A M Medical Record Number: [id number] Patient Account Number: [id number] Date of Birth/Sex: Treating RN: 02/10/1935 (87 y.o. [name] Primary Care Jahne Krukowski: Antony Contras Other  Clinician: Referring Horace Lukas: Treating Cameshia Cressman/Extender: Olivia Mackie in Treatment: 14 Encounter Discharge Information Items Discharge Condition: Stable Ambulatory Status: Ambulatory Discharge Destination: Home Transportation: Private Auto Schedule Follow-up Appointment: No Clinical Summary of Care: Provided on 09/28/2021 Form Type Recipient Paper Patient Patient Electronic Signature(s) Signed: 09/28/2021 4:32:44 PM By: Lorrin Jackson Entered By: Lorrin Jackson on 09/28/2021 09:07:41 -------------------------------------------------------------------------------- Lower Extremity Assessment Details Patient Name: Date of Service: Melissa Randolph 09/28/2021 8:45 A M Medical Record Number: [id number] Patient Account Number: [id number] Date of Birth/Sex: Treating RN: February 07, 1935 (87 y.o. [name] Primary Care Albaro Deviney: Antony Contras Other Clinician: Referring Cuinn Westerhold: Treating Elowyn Raupp/Extender: Olivia Mackie in Treatment: 14 Edema Assessment Assessed: [name]: No] [name]: Yes] Edema: [Left: N] [Right: o] Calf Left: Right: Point of Measurement: 28 cm From Medial Instep 32.5 cm Ankle Left: Right: Point of Measurement: 10 cm From Medial Instep 19.4 cm Vascular Assessment Pulses: Dorsalis Pedis Palpable: [Right:Yes] Electronic Signature(s) Signed: 09/28/2021 4:32:44 PM By: Lorrin Jackson Entered By: Lorrin Jackson on 09/28/2021 08:54:41 -------------------------------------------------------------------------------- Multi Wound Chart Details Patient Name: Date of Service: Melissa Leach C. 09/28/2021 8:45 A M Medical Record Number: [id number] Patient Account Number: [id number] Date of Birth/Sex: Treating RN: 03/20/1934 (87 y.o. [name] Primary Care Tex Conroy: Antony Contras Other Clinician: Referring Jeyda Siebel: Treating Julias Mould/Extender: Olivia Mackie in Treatment: 14 Vital  Signs Height(in): Pulse(bpm): 21 Weight(lbs): 118 Blood Pressure(mmHg): 139/66 Body Mass Index(BMI): Temperature(F): 98.0 Respiratory Rate(breaths/min): 16 Photos: [N/A:No Photos N/A] Right, Anterior Lower Leg Right, Anterior Lower Leg N/A Wound Location: Trauma Trauma N/A Wounding Event: Trauma, Other Trauma, Other N/A Primary Etiology: Asthma, Arrhythmia, Hypertension Asthma, Arrhythmia, Hypertension N/A Comorbid History: 06/04/2021 06/04/2021 N/A Date Acquired: 14 14 N/A Weeks of Treatment: Open Healed - Epithelialized N/A Wound Status: No No N/A Wound Recurrence: 0x0x0 0x0x0  N/A Measurements L x W x D (cm) 0 0 N/A A (cm) : rea 0 0 N/A Volume (cm) : 100.00% 100.00% N/A % Reduction in A rea: 100.00% 100.00% N/A % Reduction in Volume: Full Thickness Without Exposed Full Thickness Without Exposed N/A Classification: Support Structures Support Structures Treatment Notes Electronic Signature(s) Signed: 09/28/2021 9:23:16 AM By: Kalman Shan DO Signed: 09/28/2021 4:32:44 PM By: Lorrin Jackson Entered By: Kalman Shan on 09/28/2021 09:19:12 -------------------------------------------------------------------------------- Multi-Disciplinary Care Plan Details Patient Name: Date of Service: Melissa Leach C. 09/28/2021 8:45 A M Medical Record Number: [id number] Patient Account Number: [id number] Date of Birth/Sex: Treating RN: 1934-05-28 (87 y.o. [name] Primary Care Herb Beltre: Antony Contras Other Clinician: Referring Rosabelle Jupin: Treating Tab Rylee/Extender: Olivia Mackie in Treatment: 14 Active Inactive Electronic Signature(s) Signed: 09/28/2021 4:32:44 PM By: Lorrin Jackson Previous Signature: 09/28/2021 8:47:55 AM Version By: Lorrin Jackson Entered By: Lorrin Jackson on 09/28/2021 09:04:59 -------------------------------------------------------------------------------- Pain Assessment Details Patient Name: Date of  Service: Melissa Leach C. 09/28/2021 8:45 A M Medical Record Number: [id number] Patient Account Number: [id number] Date of Birth/Sex: Treating RN: 05/11/1934 (87 y.o. [name] Primary Care Geraldine Tesar: Antony Contras Other Clinician: Referring Vicie Cech: Treating Wiley Flicker/Extender: Olivia Mackie in Treatment: 14 Active Problems Location of Pain Severity and Description of Pain Patient Has Paino No Site Locations Pain Management and Medication Current Pain Management: Electronic Signature(s) Signed: 09/28/2021 4:32:44 PM By: Lorrin Jackson Entered By: Lorrin Jackson on 09/28/2021 08:50:06 -------------------------------------------------------------------------------- Patient/Caregiver Education Details Patient Name: Date of Service: [name], [name] 8/7/2023andnbsp8:45 A M Medical Record Number: [id number] Patient Account Number: [id number] Date of Birth/Gender: Treating RN: 08-13-1934 (87 y.o. [name] Primary Care Physician: Antony Contras Other Clinician: Referring Physician: Treating Physician/Extender: Olivia Mackie in Treatment: 14 Education Assessment Education Provided To: Patient Education Topics Provided Notes Healed Electronic Signature(s) Signed: 09/28/2021 4:32:44 PM By: Lorrin Jackson Entered By: Lorrin Jackson on 09/28/2021 09:05:26 -------------------------------------------------------------------------------- Wound Assessment Details Patient Name: Date of Service: Melissa Randolph 09/28/2021 8:45 A M Medical Record Number: [id number] Patient Account Number: [id number] Date of Birth/Sex: Treating RN: 1934/08/19 (87 y.o. [name] Primary Care Heliodoro Domagalski: Antony Contras Other Clinician: Referring Giavanni Odonovan: Treating Ferguson Gertner/Extender: Olivia Mackie in Treatment: 14 Wound Status Wound Number: 1 Primary Etiology: Trauma, Other Wound Location: Right,  Anterior Lower Leg Wound Status: Open Wounding Event: Trauma Comorbid History: Asthma, Arrhythmia, Hypertension Date Acquired: 06/04/2021 Weeks Of Treatment: 14 Clustered Wound: No Photos Wound Measurements Length: (cm) Width: (cm) Depth: (cm) Area: (cm) Volume: (cm) 0 % Reduction in Area: 100% 0 % Reduction in Volume: 100% 0 0 0 Wound Description Classification: Full Thickness Without Exposed Support Structur es Electronic Signature(s) Signed: 09/28/2021 4:32:44 PM By: Lorrin Jackson Entered By: Lorrin Jackson on 09/28/2021 08:56:16 -------------------------------------------------------------------------------- Wound Assessment Details Patient Name: Date of Service: Melissa Randolph 09/28/2021 8:45 A M Medical Record Number: [id number] Patient Account Number: [id number] Date of Birth/Sex: Treating RN: Jul 16, 1934 (87 y.o. [name] Primary Care Haeleigh Streiff: Antony Contras Other Clinician: Referring Lanesha Azzaro: Treating Olden Klauer/Extender: Olivia Mackie in Treatment: 14 Wound Status Wound Number: 1 Primary Etiology: Trauma, Other Wound Location: Right, Anterior Lower Leg Wound Status: Healed - Epithelialized Wounding Event: Trauma Comorbid History: Asthma, Arrhythmia, Hypertension Date Acquired: 06/04/2021 Weeks Of Treatment: 14 Clustered Wound: No Wound Measurements Length: (cm) Width: (cm) Depth: (cm) Area: (cm) Volume: (cm) 0 % Reduction in Area: 100% 0 % Reduction  in Volume: 100% 0 0 0 Wound Description Classification: Full Thickness Without Exposed Support Structur es Electronic Signature(s) Signed: 09/28/2021 4:32:44 PM By: Lorrin Jackson Entered By: Lorrin Jackson on 09/28/2021 09:02:57 -------------------------------------------------------------------------------- Vitals Details Patient Name: Date of Service: Melissa Leach C. 09/28/2021 8:45 A M Medical Record Number: [id number] Patient Account Number:  [id number] Date of Birth/Sex: Treating RN: [birth year] (87 y.o. [name] Primary Care Sherrine Salberg: Antony Contras Other Clinician: Referring Gage Weant: Treating Clodagh Odenthal/Extender: Olivia Mackie in Treatment: 14 Vital Signs Time Taken: 08:50 Temperature (F): 98.0 Weight (lbs): 118 Pulse (bpm): 79 Respiratory Rate (breaths/min): 16 Blood Pressure (mmHg): 139/66 Reference Range: 80 - 120 mg / dl Electronic Signature(s) Signed: 09/28/2021 4:32:44 PM By: Lorrin Jackson Entered By: Lorrin Jackson on 09/28/2021 08:53:16

## 2021-09-28 NOTE — Progress Notes (Signed)
ZHANAE, PROFFIT ([id number]) Visit Report for 09/28/2021 Chief Complaint Document Details Patient Name: Date of Service: Melissa Randolph 09/28/2021 8:45 A M Medical Record Number: [id number] Patient Account Number: [id number] Date of Birth/Sex: Treating RN: Sep 04, 1934 (87 y.o. [name] Primary Care Provider: Antony Contras Other Clinician: Referring Provider: Treating Provider/Extender: Olivia Mackie in Treatment: 14 Information Obtained from: Patient Chief Complaint 06/22/2021; Right lower extremity wound following trauma Electronic Signature(s) Signed: 09/28/2021 9:23:16 AM By: Kalman Shan DO Entered By: Kalman Shan on 09/28/2021 09:19:17 -------------------------------------------------------------------------------- HPI Details Patient Name: Date of Service: Melissa Leach C. 09/28/2021 8:45 A M Medical Record Number: [id number] Patient Account Number: [id number] Date of Birth/Sex: Treating RN: 1934/07/29 (87 y.o. [name] Primary Care Provider: Antony Contras Other Clinician: Referring Provider: Treating Provider/Extender: Olivia Mackie in Treatment: 14 History of Present Illness HPI Description: Admission 06/22/2021 Melissa Randolph is an 87 year old female with a past medical history of A-fib on Eliquis that presents to the clinic for a wound to her right lower extremity. She fell over a wooden baseboard on 4/13. She went to the ED for evaluation and she had 15 stitches placed. She had an x-ray of her leg that showed no acute osseous abnormality. She has been placing Xeroform over the wound bed intermittently. She visited the ED again on 4/21 because of wound infection. She was started on doxycycline and cefadroxil. She states she has a couple days remaining. She currently denies systemic signs of infection. 5/8; patient presents for follow-up. She has been using bacitracin and Xeroform to the wound  bed. She reports significant improvement in her pain to the wound bed. She states she is using her compression stockings daily. She denies signs of infection. 5/15; patient presents for follow-up. She has been using Iodoflex to the wound bed. She has no issues or complaints today. She has been using her compression stocking daily. She went to vein and vascular to have her ABIs with TBI's completed. On the right she had an ABI of 1.17 and TBI of 0.68. 5/22; patient presents for follow-up. We switched to [facility] and antibiotic ointment under a compression wrap at last clinic visit. She tolerated the wrap well. She has no issues or complaints today. She denies signs of infection. 5/30; patient presents for follow-up. We have been using Hydrofera Blue and antibiotic under compression. The Hydrofera Blue was stuck on tightly and had moved out of place under the wrap. She currently denies signs of infection. She is tolerating the compression wrap well. 6/6; patient changed to collagen last week also using Santyl. Dimensions are a lot better this week. She was also put under compression last week with 3 layer 6/13; patient presents for follow-up. We have been using collagen and Santyl under 3 layer compression. She continues to tolerate this well. She has no issues or complaints today. She denies signs of infection. 6/19; patient presents for follow-up. We have been using collagen and Santyl under 3 layer compression. She has no issues or complaints today. 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 She has been more active. She currently denies signs of infection. 6/26; patient presents for follow-up. She was switched to PolyMem silver under 3 layer compression at last clinic visit. There is slightly more maceration to the periwound however the wound itself looks improved. She has no issues or complaints today. 7/10; patient presents for follow-up. We have  been using  Hydrofera Blue under 3 layer compression. She has no issues or complaints today. She had a nurse visit at last clinic visit for wrap change. 7/17; patient presents for follow-up. We have been using Hydrofera Blue under 3 layer compression. She has no issues or complaints today. She denies signs of infection. 7/24; Patient presents for follow-up. We have been using Hydrofera Blue and Santyl under 3 layer compression. Patient tolerated this well. She has no issues or complaints today. 7/31; patient presents for follow-up. We continue to use Hydrofera Blue and Santyl under 3 layer compression. Patient has no issues or complaints today. 8/7; patient presents for follow-up. We have been using Hydrofera Blue under 3 layer compression. Patient has no issues or complaints today. Electronic Signature(s) Signed: 09/28/2021 9:23:16 AM By: Kalman Shan DO Entered By: Kalman Shan on 09/28/2021 09:19:47 -------------------------------------------------------------------------------- Physical Exam Details Patient Name: Date of Service: Melissa Randolph 09/28/2021 8:45 A M Medical Record Number: [id number] Patient Account Number: [id number] Date of Birth/Sex: Treating RN: 05/11/1934 (87 y.o. [name] Primary Care Provider: Antony Contras Other Clinician: Referring Provider: Treating Provider/Extender: Olivia Mackie in Treatment: 14 Constitutional respirations regular, non-labored and within target range for patient.. Cardiovascular 2+ dorsalis pedis/posterior tibialis pulses. Psychiatric pleasant and cooperative. Notes Right lower extremity: Epithelization to the previous wound site. Electronic Signature(s) Signed: 09/28/2021 9:23:16 AM By: Kalman Shan DO Entered By: Kalman Shan on 09/28/2021 09:20:19 -------------------------------------------------------------------------------- Physician Orders Details Patient Name: Date of Service: Melissa Leach C. 09/28/2021 8:45 A M Medical Record Number: [id number] Patient Account Number: [id number] Date of Birth/Sex: Treating RN: [birth year] (87 y.o. 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 Edema Control - Lymphedema / SCD / Other Moisturize legs daily. - Apply lotion 1-2x daily Non Wound Condition Protect area with: - May protect with bandaid as needed. Electronic Signature(s) Signed: 09/28/2021 9:23:16 AM By: Kalman Shan DO Entered By: Kalman Shan on 09/28/2021 09:20:34 -------------------------------------------------------------------------------- Problem List Details Patient Name: Date of Service: Melissa Leach C. 09/28/2021 8:45 A M Medical Record Number: [id number] Patient Account Number: [id number] Date of Birth/Sex: Treating RN: Jul 04, 1934 (87 y.o. [name] Primary Care Provider: Antony Contras Other Clinician: Referring Provider: Treating Provider/Extender: Olivia Mackie in Treatment: 14 Active Problems ICD-10 Encounter Code Description Active Date MDM Diagnosis [phone] Non-pressure chronic ulcer of unspecified part of right lower leg with fat layer 06/22/2021 No Yes exposed T79.8XXA Other early complications of trauma, initial encounter 06/22/2021 No Yes I48.20  Chronic atrial fibrillation, unspecified 06/22/2021 No Yes Z79.01 Long term (current) use of anticoagulants 06/22/2021 No Yes I10 Essential (primary) hypertension 06/22/2021 No Yes I87.2 Venous insufficiency (chronic) (peripheral) 07/06/2021 No Yes Inactive Problems Resolved Problems Electronic Signature(s) Signed: 09/28/2021 9:23:16 AM By: Kalman Shan DO Previous Signature: 09/28/2021 8:47:41 AM Version By: Lorrin Jackson Entered By: Kalman Shan on 09/28/2021 09:19:05 -------------------------------------------------------------------------------- Progress Note Details Patient Name: Date of Service: Melissa Randolph, Melissa [name] 09/28/2021  8:45 A M Medical Record Number: [id number] Patient Account Number: [id number] Date of Birth/Sex: Treating RN: [birth year] (87 y.o. [name] Primary Care Provider: Antony Contras Other Clinician: Referring Provider: Treating Provider/Extender: Olivia Mackie in Treatment: 14 Subjective Chief Complaint Information obtained from Patient 06/22/2021; Right lower extremity wound following trauma History of Present Illness (HPI) Admission 06/22/2021 Ms. Soleia Badolato is an 87 year old female with a past medical history of A-fib on Eliquis that presents to the clinic for a wound to her right lower extremity. She fell over a wooden baseboard on 4/13. She went to the ED for evaluation and she had 15 stitches placed. She had an x-ray of her leg that showed no acute osseous abnormality. She has been placing Xeroform over the wound bed intermittently. She visited the ED again on 4/21 because of wound infection. She was started on doxycycline and cefadroxil. She states she has a couple days remaining. She currently denies systemic signs of infection. 5/8; patient presents for follow-up. She has been using bacitracin and Xeroform to the wound bed. She reports significant improvement in her pain to the wound bed. She states she is using her  compression stockings daily. She denies signs of infection. 5/15; patient presents for follow-up. She has been using Iodoflex to the wound bed. She has no issues or complaints today. She has been using her compression stocking daily. She went to vein and vascular to have her ABIs with TBI's completed. On the right she had an ABI of 1.17 and TBI of 0.68. 5/22; patient presents for follow-up. We switched to [facility] and antibiotic ointment under a compression wrap at last clinic visit. She tolerated the wrap well. She has no issues or complaints today. She denies signs of infection. 5/30; patient presents for follow-up. We have been using Hydrofera Blue and antibiotic under compression. The Hydrofera Blue was stuck on tightly and had moved out of place under the wrap. She currently denies signs of infection. She is tolerating the compression wrap well. 6/6; patient changed to collagen last week also using Santyl. Dimensions are a lot better this week. She was also put under compression last week with 3 layer 6/13; patient presents for follow-up. We have been using collagen and Santyl under 3 layer compression. She continues to tolerate this well. She has no issues or complaints today. She denies signs of infection. 6/19; patient presents for follow-up. We have been using collagen and Santyl under 3 layer compression. She has no issues or complaints today. 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 She has been more active. She currently denies signs of infection. 6/26; patient presents for follow-up. She was switched to PolyMem silver under 3 layer compression at last clinic visit. There is slightly more maceration to the periwound however the wound itself looks improved. She has no issues or complaints today. 7/10; patient presents for follow-up. We have been using Hydrofera Blue under 3 layer compression. She has no issues or complaints today. She had a  nurse visit at last clinic visit for wrap change. 7/17; patient presents for follow-up. We have been using Hydrofera Blue under 3 layer compression. She has no issues or complaints today. She denies signs of infection. 7/24; Patient presents for follow-up. We have been using Hydrofera Blue and Santyl under 3 layer compression. Patient tolerated this well. She has no issues or complaints today. 7/31; patient presents for follow-up. We continue to use Hydrofera Blue and  Santyl under 3 layer compression. Patient has no issues or complaints today. 8/7; patient presents for follow-up. We have been using Hydrofera Blue under 3 layer compression. Patient has no issues or complaints today. Patient History Information obtained from Patient. Family History Cancer - Father, Heart Disease - Father, Hypertension - Father, No family history of Diabetes, Kidney Disease, Lung Disease, Seizures, Stroke, Thyroid Problems, Tuberculosis. Social History Never smoker, Marital Status - Married, Alcohol Use - Never, Drug Use - No History, Caffeine Use - Never. Medical History Respiratory Patient has history of Asthma Cardiovascular Patient has history of Arrhythmia - A-fib, Hypertension Psychiatric Denies history of Anorexia/bulimia, Confinement Anxiety Hospitalization/Surgery History - breast surgery 8 years ago. Medical A Surgical History Notes nd Eyes cataract surgery both eyes Cardiovascular hypercholesterolemia Objective Constitutional respirations regular, non-labored and within target range for patient.. Vitals Time Taken: 8:50 AM, Weight: 118 lbs, Temperature: 98.0 F, Pulse: 79 bpm, Respiratory Rate: 16 breaths/min, Blood Pressure: 139/66 mmHg. Cardiovascular 2+ dorsalis pedis/posterior tibialis pulses. Psychiatric pleasant and cooperative. General Notes: Right lower extremity: Epithelization to the previous wound site. Integumentary (Hair, Skin) Wound #1 status is Open. Original cause of  wound was Trauma. The date acquired was: 06/04/2021. The wound has been in treatment 14 weeks. The wound is located on the Right,Anterior Lower Leg. The wound measures 0cm length x 0cm width x 0cm depth; 0cm^2 area and 0cm^3 volume. Wound #1 status is Healed - Epithelialized. Original cause of wound was Trauma. The date acquired was: 06/04/2021. The wound has been in treatment 14 weeks. The wound is located on the Right,Anterior Lower Leg. The wound measures 0cm length x 0cm width x 0cm depth; 0cm^2 area and 0cm^3 volume. 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 It has healed. She may follow-up as needed. Plan Discharge From [facility] Services: Discharge from [place] - No further follow up needed, call if anything changes. Edema Control - Lymphedema / SCD / Other: Moisturize legs daily. - Apply lotion 1-2x daily Non Wound Condition: Protect area with: - May protect with bandaid as needed. 1. Discharge from clinic due to closed wound 2. Follow-up as needed Electronic Signature(s) Signed: 09/28/2021 9:23:16 AM By: Kalman Shan DO Entered By: Kalman Shan on 09/28/2021 09:22:44 -------------------------------------------------------------------------------- HxROS Details Patient Name: Date of Service: Melissa Leach C. 09/28/2021 8:45 A M Medical Record Number: [id number] Patient Account Number: [id number] Date of Birth/Sex: Treating RN: Aug 21, 1934 (87 y.o. [name] Primary Care Provider: Antony Contras Other Clinician: Referring Provider: Treating Provider/Extender: Olivia Mackie in Treatment: 14 Information Obtained From Patient Eyes Medical History: Past Medical History Notes: cataract surgery both eyes Respiratory Medical History: Positive for: Asthma Cardiovascular Medical History: Positive for: Arrhythmia - A-fib; Hypertension Past Medical History Notes: hypercholesterolemia Psychiatric Medical History: Negative for: Anorexia/bulimia; Confinement Anxiety Immunizations Pneumococcal Vaccine: Received Pneumococcal Vaccination: Yes Received Pneumococcal Vaccination On or After 60th Birthday: Yes Implantable Devices None Hospitalization / Surgery History Type of Hospitalization/Surgery breast surgery 8 years ago Family and Social History Cancer: Yes - Father; Diabetes: No; Heart Disease: Yes - Father; Hypertension: Yes - Father; Kidney Disease: No; Lung Disease: No; Seizures: No; Stroke: No; Thyroid Problems: No; Tuberculosis: No; Never smoker; Marital Status - Married; Alcohol  Use: Never; Drug Use: No History; Caffeine Use: Never; Financial Concerns: No; Food, Clothing or Shelter Needs: No; Support System Lacking: No; Transportation Concerns: No Electronic Signature(s) Signed: 09/28/2021 9:23:16 AM By: Kalman Shan DO Signed: 09/28/2021 4:32:44 PM By: Lorrin Jackson Entered By: Kalman Shan on 09/28/2021 09:19:52 -------------------------------------------------------------------------------- SuperBill Details Patient Name: Date of Service: Melissa Randolph 09/28/2021 Medical Record Number: [id number] Patient Account Number: [id number] Date of Birth/Sex: Treating RN: [birth year] (87 y.o. 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 T79.8XXA Other early  complications of trauma, initial encounter I48.20 Chronic atrial fibrillation, unspecified Z79.01 Long term (current) use of anticoagulants I10 Essential (primary) hypertension I87.2 Venous insufficiency (chronic) (peripheral) Facility Procedures CPT4 Code: 11941740 Description: 81448 - WOUND CARE VISIT-LEV 2 EST PT Modifier: Quantity: 1 Physician Procedures : CPT4 Code Description Modifier [id number] - WC PHYS LEVEL 3 - EST PT ICD-10 Diagnosis Description V78.588 Non-pressure chronic ulcer of unspecified 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

## 2021-10-30 ENCOUNTER — Other Ambulatory Visit: Payer: Self-pay | Admitting: Interventional Cardiology

## 2021-10-30 NOTE — Telephone Encounter (Signed)
Prescription refill request for Eliquis received. Indication:Afib  Last office visit:05/06/21 [place])  Scr: 0.77 (06/12/21)  Age: 87 Weight: 53.5kg  Appropriate dose and refill sent to requested pharmacy.

## 2021-11-13 DIAGNOSIS — L820 Inflamed seborrheic keratosis: Secondary | ICD-10-CM | POA: Diagnosis not present

## 2021-11-13 DIAGNOSIS — L570 Actinic keratosis: Secondary | ICD-10-CM | POA: Diagnosis not present

## 2021-12-21 ENCOUNTER — Encounter: Payer: Self-pay | Admitting: Podiatry

## 2021-12-21 ENCOUNTER — Ambulatory Visit: Payer: Medicare Other | Admitting: Podiatry

## 2021-12-21 DIAGNOSIS — B351 Tinea unguium: Secondary | ICD-10-CM

## 2021-12-21 DIAGNOSIS — M79674 Pain in right toe(s): Secondary | ICD-10-CM

## 2021-12-21 DIAGNOSIS — M79671 Pain in right foot: Secondary | ICD-10-CM | POA: Diagnosis not present

## 2021-12-21 NOTE — Progress Notes (Signed)
This patient returns to my office for at risk foot care.  This patient requires this care by a professional since this patient will be at risk due to having coagulation defect.  Patient is taking eliquiss.  This patient is unable to cut nails herself since the patient cannot reach her nails.These nails are painful walking and wearing shoes.  This patient presents for at risk foot care today. 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  General Appearance  Alert, conversant and in no acute stress.  Vascular  Dorsalis pedis and posterior tibial  pulses are palpable  bilaterally.  Capillary return is within normal limits  bilaterally. Temperature is within normal limits  bilaterally.  Neurologic  Senn-Weinstein monofilament wire test within normal limits  bilaterally. Muscle power within normal limits bilaterally.  Nails Thick disfigured discolored nails with subungual debris  first second toes toenails right foot.. No evidence of bacterial infection or drainage bilaterally.  Orthopedic  No limitations of motion  feet .  No crepitus or effusions noted.  No bony pathology or digital deformities noted.  Skin  normotropic skin with no porokeratosis noted bilaterally.  No signs of infections or ulcers noted.     Onychomycosis  Pain in right toes  Pain in left toes  Consent was obtained for treatment procedures.   Mechanical debridement of nails 1-5  bilaterally performed with a nail nipper.  Filed with dremel without incident.    Return office visit     prn             Told patient to return for periodic foot care and evaluation due to potential at risk complications.   Gardiner Barefoot DPM

## 2022-01-27 DIAGNOSIS — H0102A Squamous blepharitis right eye, upper and lower eyelids: Secondary | ICD-10-CM | POA: Diagnosis not present

## 2022-01-27 DIAGNOSIS — D1801 Hemangioma of skin and subcutaneous tissue: Secondary | ICD-10-CM | POA: Diagnosis not present

## 2022-01-27 DIAGNOSIS — Z85828 Personal history of other malignant neoplasm of skin: Secondary | ICD-10-CM | POA: Diagnosis not present

## 2022-01-27 DIAGNOSIS — L821 Other seborrheic keratosis: Secondary | ICD-10-CM | POA: Diagnosis not present

## 2022-01-27 DIAGNOSIS — L570 Actinic keratosis: Secondary | ICD-10-CM | POA: Diagnosis not present

## 2022-01-27 DIAGNOSIS — H43393 Other vitreous opacities, bilateral: Secondary | ICD-10-CM | POA: Diagnosis not present

## 2022-01-27 DIAGNOSIS — Z85820 Personal history of malignant melanoma of skin: Secondary | ICD-10-CM | POA: Diagnosis not present

## 2022-01-27 DIAGNOSIS — H26491 Other secondary cataract, right eye: Secondary | ICD-10-CM | POA: Diagnosis not present

## 2022-01-27 DIAGNOSIS — Z83518 Family history of other specified eye disorder: Secondary | ICD-10-CM | POA: Diagnosis not present

## 2022-01-27 DIAGNOSIS — L820 Inflamed seborrheic keratosis: Secondary | ICD-10-CM | POA: Diagnosis not present

## 2022-01-27 DIAGNOSIS — L814 Other melanin hyperpigmentation: Secondary | ICD-10-CM | POA: Diagnosis not present

## 2022-02-16 DIAGNOSIS — M810 Age-related osteoporosis without current pathological fracture: Secondary | ICD-10-CM | POA: Diagnosis not present

## 2022-03-05 DIAGNOSIS — M1990 Unspecified osteoarthritis, unspecified site: Secondary | ICD-10-CM | POA: Diagnosis not present

## 2022-03-05 DIAGNOSIS — R600 Localized edema: Secondary | ICD-10-CM | POA: Diagnosis not present

## 2022-03-05 DIAGNOSIS — J309 Allergic rhinitis, unspecified: Secondary | ICD-10-CM | POA: Diagnosis not present

## 2022-03-05 DIAGNOSIS — E782 Mixed hyperlipidemia: Secondary | ICD-10-CM | POA: Diagnosis not present

## 2022-03-05 DIAGNOSIS — J4520 Mild intermittent asthma, uncomplicated: Secondary | ICD-10-CM | POA: Diagnosis not present

## 2022-03-05 DIAGNOSIS — M810 Age-related osteoporosis without current pathological fracture: Secondary | ICD-10-CM | POA: Diagnosis not present

## 2022-03-05 DIAGNOSIS — D6869 Other thrombophilia: Secondary | ICD-10-CM | POA: Diagnosis not present

## 2022-03-05 DIAGNOSIS — I4891 Unspecified atrial fibrillation: Secondary | ICD-10-CM | POA: Diagnosis not present

## 2022-04-12 DIAGNOSIS — M25562 Pain in left knee: Secondary | ICD-10-CM | POA: Diagnosis not present

## 2022-04-26 ENCOUNTER — Other Ambulatory Visit: Payer: Self-pay | Admitting: Interventional Cardiology

## 2022-04-26 DIAGNOSIS — I480 Paroxysmal atrial fibrillation: Secondary | ICD-10-CM

## 2022-04-26 NOTE — Telephone Encounter (Signed)
Prescription refill request for Eliquis received. Indication: a fib Last office visit: 05/06/21 Scr: 0.88 03/05/22 [facility] Age: 88 Weight: 54kg

## 2022-05-06 ENCOUNTER — Other Ambulatory Visit: Payer: Self-pay | Admitting: Interventional Cardiology

## 2022-05-11 DIAGNOSIS — M25562 Pain in left knee: Secondary | ICD-10-CM | POA: Diagnosis not present

## 2022-05-15 ENCOUNTER — Other Ambulatory Visit: Payer: Self-pay | Admitting: Interventional Cardiology

## 2022-06-06 NOTE — Progress Notes (Unsigned)
Cardiology Office Note   Date:  06/07/2022   ID:  Melissa Randolph, DOB 1934/07/03, MRN [id number]  PCP:  Tally Joe, MD    No chief complaint on file.  PAF  Wt Readings from Last 3 Encounters:  06/07/22 116 lb 3.2 oz (52.7 kg)  06/12/21 118 lb (53.5 kg)  05/06/21 120 lb (54.4 kg)       History of Present Illness: Melissa Randolph is a 88 y.o. female   who has had multiple atrial arrhythmias including atrial fibrillation. Sx have ben controlled on [place]. In the past, she has had some palpitations during stressful days while wearing monitor in 2013, as she was not taking the usual extra diltiazem. She had AFib 3% of the time.    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  She has declined anticoagulation in the past.    Was found to be in AFib.  Agreed to low dose Eliquis in 10/19.    Has prn Diltiazem, but has not needed to use often- very rare use.    After COVID, she has not gone to the gym.  SHe stayed active with pickle ball, walking, yard work.  SOme exercise indoors  Basal cell CA removed from her face.  In 2023: "Walks dog and takes yoga. Does yardwork."  In 2024, still does yoga.  Had to take care of husband after hip surgery.  She walks up a few stairs without difficulty.  No bleeding problems.  She still plays the organ in church.   Denies : Chest pain. Dizziness. Leg edema. Nitroglycerin use. Orthopnea. Palpitations. Paroxysmal nocturnal dyspnea. Shortness of breath. Syncope.   Past Medical History:  Diagnosis Date   Allergy    Asthma    Atrial fibrillation    Heart murmur    HTN (hypertension)    Hypercholesteremia    Mixed hyperlipidemia    Palpitation     Past Surgical History:  Procedure Laterality Date   BREAST SURGERY       Current Outpatient Medications  Medication Sig Dispense Refill   Ascorbic Acid (VITAMIN C PO) Take 1 tablet by  mouth daily. 400 mg     azelastine (ASTELIN) 0.1 % nasal spray Place 1 spray into both nostrils 2 (two) times daily. Use in each nostril as directed     cholecalciferol (VITAMIN D) 400 UNITS TABS tablet Take 1,000 Units by mouth daily.      denosumab (PROLIA) 60 MG/ML SOSY injection as directed Subcutaneous     digoxin (LANOXIN) 0.125 MG tablet Take 1 tablet (125 mcg total) by mouth daily. 90 tablet 3   diltiazem (CARDIZEM CD) 180 MG 24 hr capsule TAKE 1 CAPSULE(180 MG) BY MOUTH IN THE MORNING AND AT BEDTIME 180 capsule 0   diltiazem (CARDIZEM) 60 MG tablet Take 1 tablet (60 mg total) by mouth daily as needed (Take as directed). 90 tablet 3   doxycycline (VIBRAMYCIN) 100 MG capsule Take 1 capsule (100 mg total) by mouth 2 (two) times daily. 14 capsule 0   ELIQUIS 2.5 MG TABS tablet TAKE 1 TABLET(2.5 MG) BY MOUTH TWICE DAILY 180 tablet 1   fexofenadine (ALLEGRA) 60 MG tablet Take 60 mg by mouth daily as needed for allergies or rhinitis.     Fish Oil OIL Take 100 mg by mouth daily.      fluticasone (FLONASE) 50 MCG/ACT nasal spray Use each nostril  twice daily for 5 days, then once daily. 16 g 2   Potassium Gluconate 550 MG TABS Take 1 tablet (550 mg total) by mouth daily. 93 tablet 3   rosuvastatin (CRESTOR) 10 MG tablet TAKE 1 TABLET BY MOUTH 3 TIMES A WEEK ON MONDAY WEDNESDAY AND SATURDAY 15 tablet 1   tretinoin (RETIN-A) 0.1 % cream Apply 1 application topically at bedtime.     ARNUITY ELLIPTA 100 MCG/ACT AEPB Take 1 puff by mouth daily.     cefadroxil (DURICEF) 500 MG capsule Take 2 capsules (1,000 mg total) by mouth 2 (two) times daily. 28 capsule 0   Coenzyme Q10 (Q-SORB CO Q-10) 100 MG capsule 1 capsule with a meal Orally Once a day     No current facility-administered medications for this visit.    Allergies:   Azithromycin, Ivp dye [iodinated contrast media], Polycin [bacitracin-polymyxin b], and Penicillin g    Social History:  The patient  reports that she has never smoked. She has  never used smokeless tobacco. She reports that she does not drink alcohol and does not use drugs.   Family History:  The patient's family history includes Cancer in her father; Hyperlipidemia in her mother; Stroke in her mother.    ROS:  Please see the history of present illness.   Otherwise, review of systems are positive for rare palpitations with stress when playing the organ.   All other systems are reviewed and negative.    PHYSICAL EXAM: VS:  BP (!) 140/64   Pulse 80   Ht 5\' 4"  (1.626 m)   Wt 116 lb 3.2 oz (52.7 kg)   SpO2 99%   BMI 19.95 kg/m  , BMI Body mass index is 19.95 kg/m. GEN: Well nourished, well developed, in no acute distress HEENT: normal Neck: no JVD, carotid bruits, or masses Cardiac: irregularly irregular; no murmurs, rubs, or gallops,no edema  Respiratory:  clear to auscultation bilaterally, normal work of breathing GI: soft, nontender, nondistended, + BS MS: no deformity or atrophy Skin: warm and dry, no rash Neuro:  Strength and sensation are intact Psych: euthymic mood, full affect   EKG:   The ekg ordered today demonstrates AFib, rate control, RBBB, unchanged compared to 2023   Recent Labs: 06/12/2021: BUN 12; Creatinine, Ser 0.77; Hemoglobin 12.0; Platelets 351; Potassium 3.0; Sodium 141   Lipid Panel    Component Value Date/Time   CHOL 180 12/20/2016 0841   TRIG 143 12/20/2016 0841   HDL 61 12/20/2016 0841   CHOLHDL 3.0 12/20/2016 0841   CHOLHDL 3.0 12/18/2015 0739   VLDL 23 12/18/2015 0739   LDLCALC 90 12/20/2016 0841     Other studies Reviewed: Additional studies/ records that were reviewed today with results demonstrating: labs reviewed.  Creatinine 0.88 in January 2024.  Normal Dig level per her report in Jan with her PMD.   ASSESSMENT AND PLAN:  Atrial fibrillation: Rate controlled.  No bleeding or falling issues.  Low-dose Eliquis for stroke prevention in the setting of acquired thrombophilia.  She has been rate controlled.   Avoid falling.  Continue regular exercise to the target noted below.  Plan for Eliquis labs in July 2024 with CBC, bmet and digoxin level given her age and risk of complications with digoxin toxicity Hyperlipidemia: Whole food, plant-based diet.  High-fiber diet.  Avoid processed foods.  January 2024 cholesterol 177 HDL 69 LDL 89 triglycerides 112.  She eats a healthy diet.  Lipids well-controlled. Elevated blood pressure: Minimize salt.  Continue to  check blood pressure readings at home.  Home blood pressure readings reviewed.  She brought a list to the office with systolic blood pressures ranging anywhere from 114-130.  No significant elevated readings. Palpitations: Has had other issues like PACs in the past.   Current medicines are reviewed at length with the patient today.  The patient concerns regarding her medicines were addressed.  The following changes have been made:  No change  Labs/ tests ordered today include:  No orders of the defined types were placed in this encounter.   Recommend 150 minutes/week of aerobic exercise Low fat, low carb, high fiber diet recommended  Disposition:   FU in 1 year   Signed, Lance Muss, MD  06/07/2022 8:46 AM    [facility] [address] [place], [place], [place]  [address] Phone: [phone]; Fax: [phone]

## 2022-06-07 ENCOUNTER — Ambulatory Visit: Payer: Medicare Other | Attending: Interventional Cardiology | Admitting: Interventional Cardiology

## 2022-06-07 ENCOUNTER — Encounter: Payer: Self-pay | Admitting: Interventional Cardiology

## 2022-06-07 VITALS — BP 140/64 | HR 80 | Ht 64.0 in | Wt 116.2 lb

## 2022-06-07 DIAGNOSIS — R030 Elevated blood-pressure reading, without diagnosis of hypertension: Secondary | ICD-10-CM | POA: Diagnosis not present

## 2022-06-07 DIAGNOSIS — D6869 Other thrombophilia: Secondary | ICD-10-CM | POA: Diagnosis not present

## 2022-06-07 DIAGNOSIS — E782 Mixed hyperlipidemia: Secondary | ICD-10-CM | POA: Diagnosis not present

## 2022-06-07 DIAGNOSIS — I480 Paroxysmal atrial fibrillation: Secondary | ICD-10-CM | POA: Diagnosis not present

## 2022-06-07 NOTE — Patient Instructions (Signed)
Medication Instructions:  Your physician recommends that you continue on your current medications as directed. Please refer to the Current Medication list given to you today.  *If you need a refill on your cardiac medications before your next appointment, please call your pharmacy*   Lab Work: Your physician recommends that you return for lab work on July 15--CBC, BMP, Digoxin level. This is not fasting.  You can come in for lab work anytime from 7:15 AM to 4:30 PM  If you have labs (blood work) drawn today and your tests are completely normal, you will receive your results only by: MyChart Message (if you have MyChart) OR A paper copy in the mail If you have any lab test that is abnormal or we need to change your treatment, we will call you to review the results.   Testing/Procedures: none   Follow-Up: At [facility], you and your health needs are our priority.  As part of our continuing mission to provide you with exceptional heart care, we have created designated Provider Care Teams.  These Care Teams include your primary Cardiologist (physician) and Advanced Practice Providers (APPs -  Physician Assistants and Nurse Practitioners) who all work together to provide you with the care you need, when you need it.  We recommend signing up for the patient portal called "MyChart".  Sign up information is provided on this After Visit Summary.  MyChart is used to connect with patients for Virtual Visits (Telemedicine).  Patients are able to view lab/test results, encounter notes, upcoming appointments, etc.  Non-urgent messages can be sent to your provider as well.   To learn more about what you can do with MyChart, go to [email or web address].    Your next appointment:   12 month(s)  Provider:   Lance Muss, MD     Other Instructions

## 2022-06-07 NOTE — Addendum Note (Signed)
Addended by: Lacy Duverney R on: 06/07/2022 09:05 AM   Modules accepted: Orders

## 2022-06-10 DIAGNOSIS — M1712 Unilateral primary osteoarthritis, left knee: Secondary | ICD-10-CM | POA: Diagnosis not present

## 2022-06-10 DIAGNOSIS — M25562 Pain in left knee: Secondary | ICD-10-CM | POA: Diagnosis not present

## 2022-06-14 ENCOUNTER — Other Ambulatory Visit: Payer: Self-pay | Admitting: Interventional Cardiology

## 2022-06-15 DIAGNOSIS — M25562 Pain in left knee: Secondary | ICD-10-CM | POA: Diagnosis not present

## 2022-06-22 DIAGNOSIS — S83242A Other tear of medial meniscus, current injury, left knee, initial encounter: Secondary | ICD-10-CM | POA: Diagnosis not present

## 2022-06-22 DIAGNOSIS — M25562 Pain in left knee: Secondary | ICD-10-CM | POA: Diagnosis not present

## 2022-07-14 ENCOUNTER — Other Ambulatory Visit: Payer: Self-pay | Admitting: Interventional Cardiology

## 2022-07-22 IMAGING — DX DG FOOT COMPLETE 3+V*R*
3 series · 3 of 3 positions shown · non-contrast
Comparison: Right foot radiographs 11/22/2016 and 10/18/2014

CLINICAL DATA: Patient fell on 06/04/2021 with cut to the right
leg. Cellulitis in the lower leg noted today. Right foot pain and
bruising in the second toe.

EXAM:
RIGHT FOOT COMPLETE - 3+ VIEW

[foot ap]
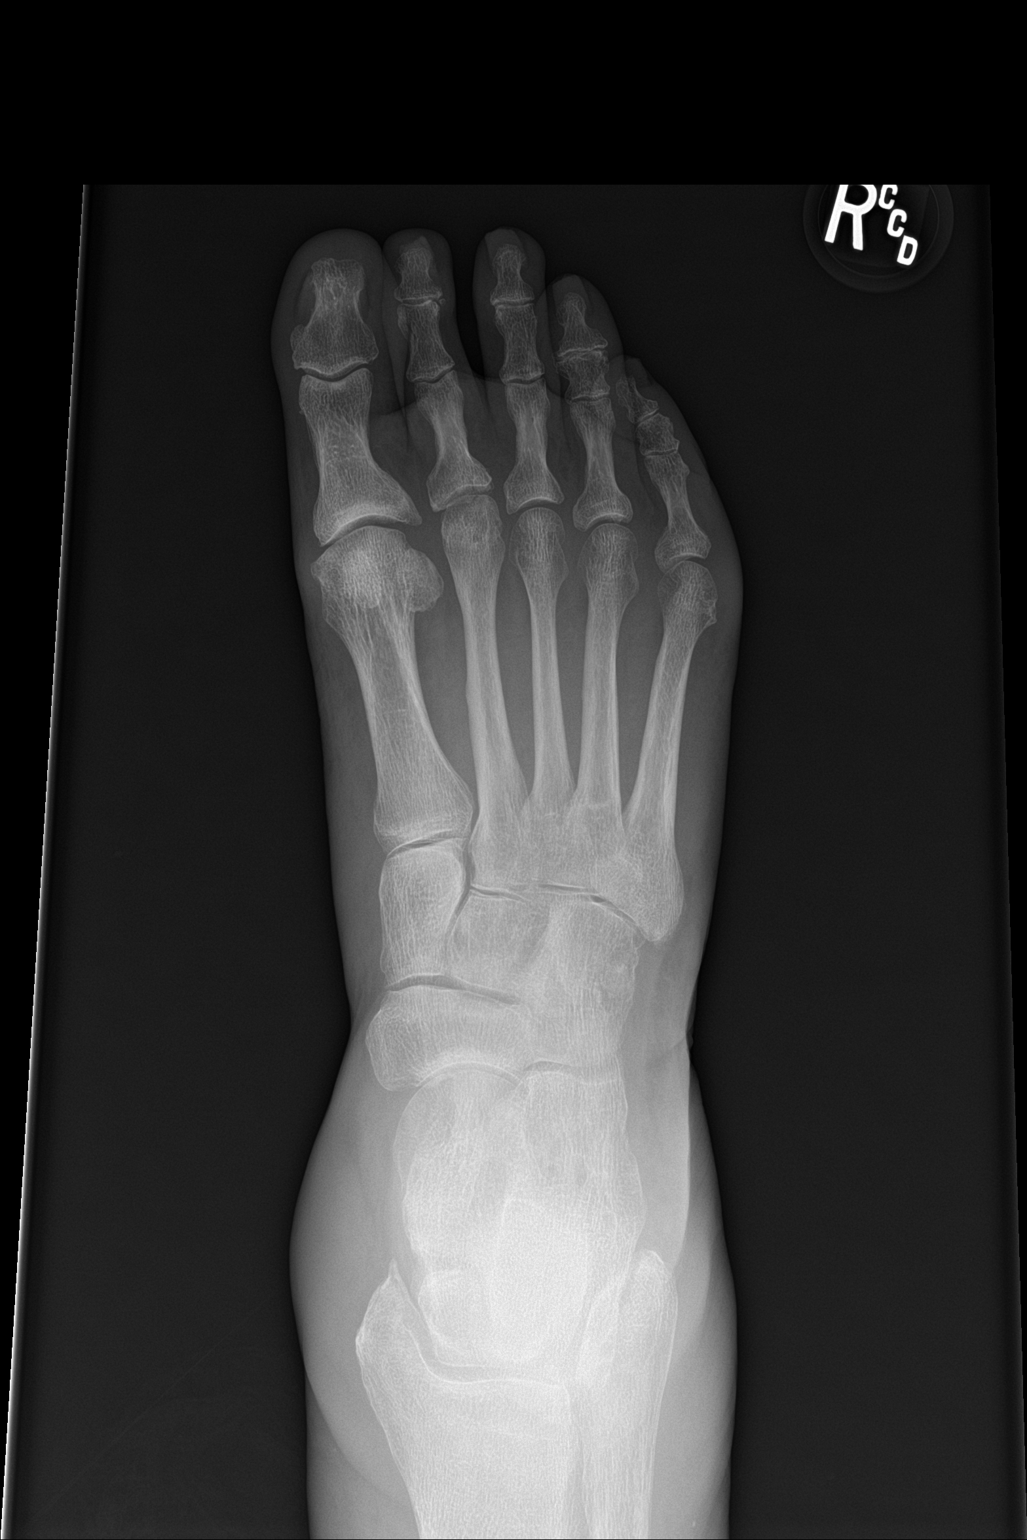

[foot obl]
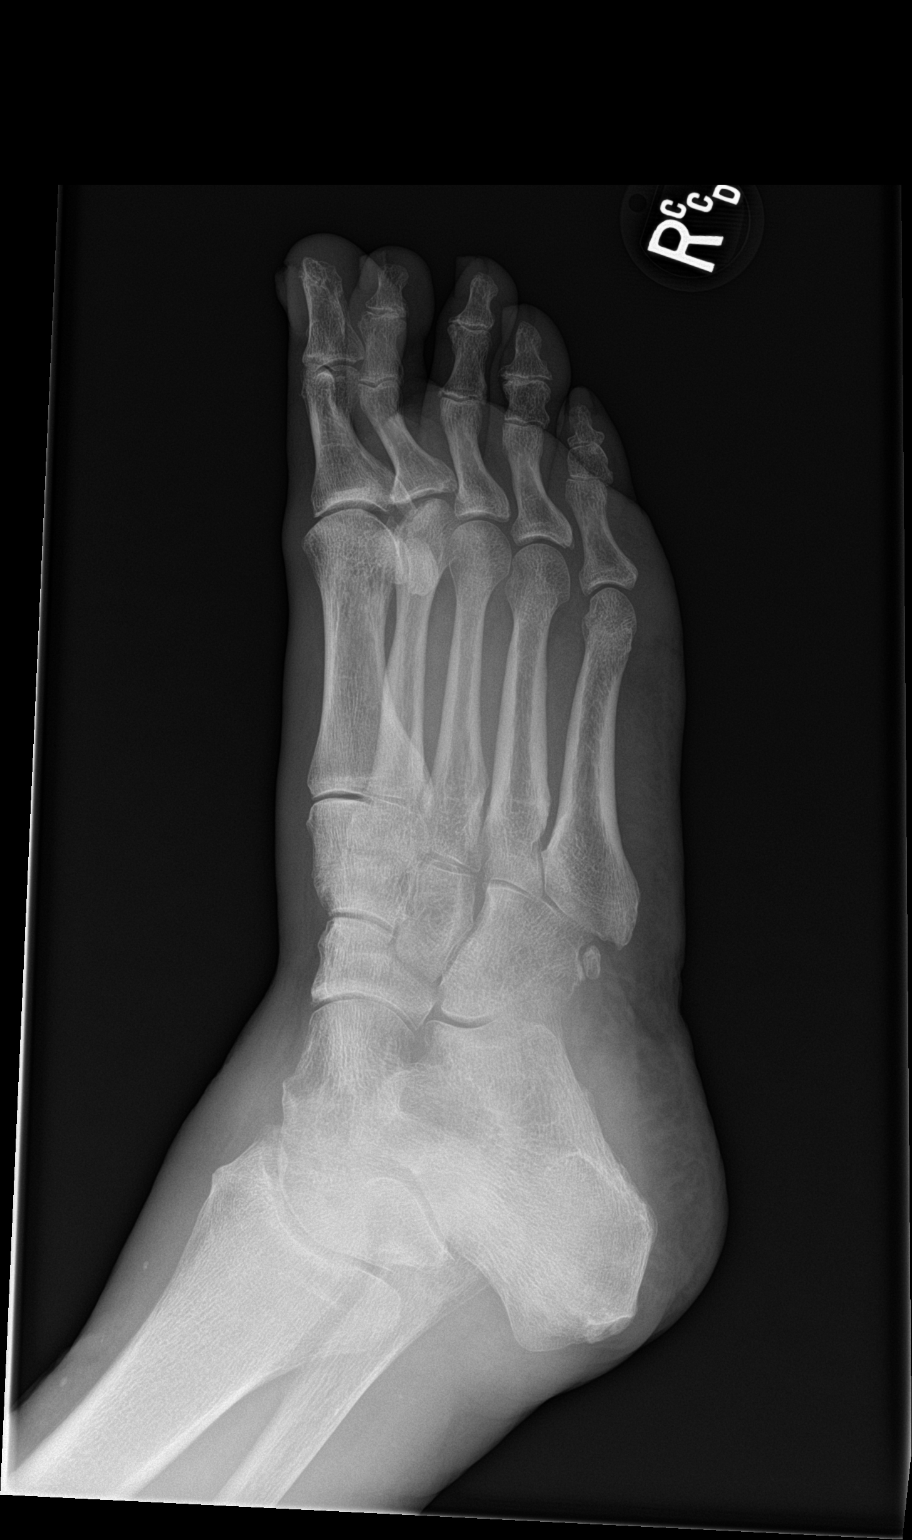

[foot lat]
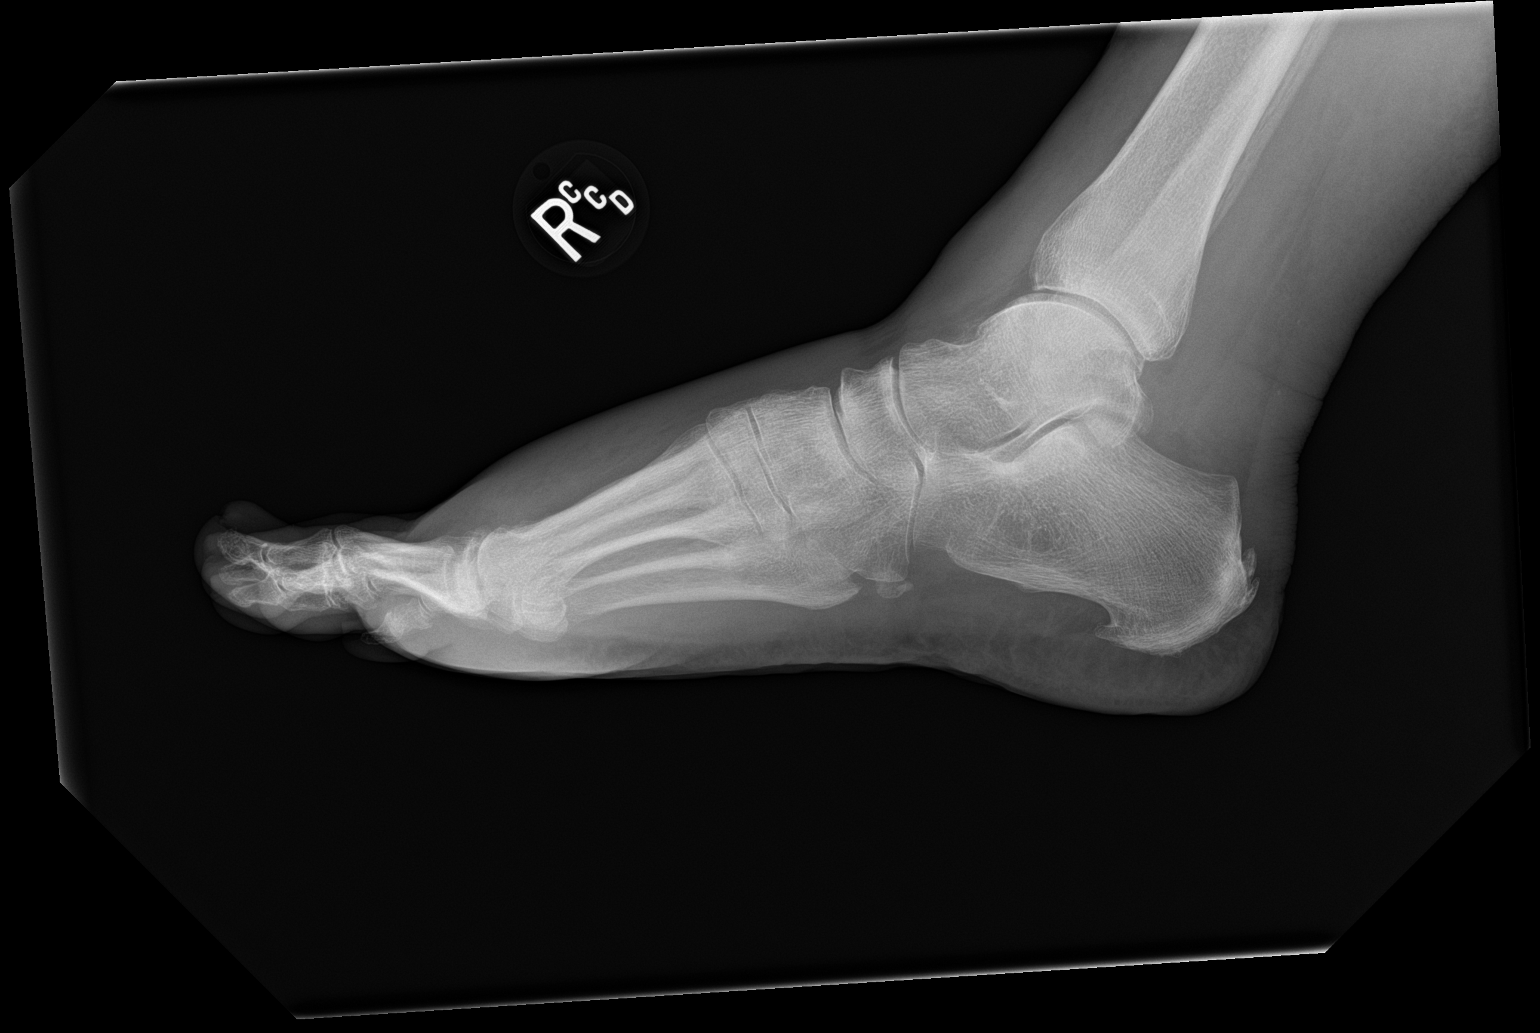

[3 of 3 positions shown; findings below may reference images not displayed]

FINDINGS: No fracture or dislocation. No significant arthropathy. Small
posterior and plantar calcaneal enthesophytes. Os peroneum. Diffuse
swelling about the forefoot.
IMPRESSION: No acute osseous abnormality in the right foot. Diffuse swelling
about the forefoot.

## 2022-07-29 DIAGNOSIS — Z85828 Personal history of other malignant neoplasm of skin: Secondary | ICD-10-CM | POA: Diagnosis not present

## 2022-07-29 DIAGNOSIS — L570 Actinic keratosis: Secondary | ICD-10-CM | POA: Diagnosis not present

## 2022-07-29 DIAGNOSIS — L814 Other melanin hyperpigmentation: Secondary | ICD-10-CM | POA: Diagnosis not present

## 2022-07-29 DIAGNOSIS — L820 Inflamed seborrheic keratosis: Secondary | ICD-10-CM | POA: Diagnosis not present

## 2022-07-29 DIAGNOSIS — D485 Neoplasm of uncertain behavior of skin: Secondary | ICD-10-CM | POA: Diagnosis not present

## 2022-07-29 DIAGNOSIS — L821 Other seborrheic keratosis: Secondary | ICD-10-CM | POA: Diagnosis not present

## 2022-07-29 DIAGNOSIS — C44329 Squamous cell carcinoma of skin of other parts of face: Secondary | ICD-10-CM | POA: Diagnosis not present

## 2022-08-17 ENCOUNTER — Other Ambulatory Visit: Payer: Self-pay | Admitting: Interventional Cardiology

## 2022-08-19 DIAGNOSIS — M810 Age-related osteoporosis without current pathological fracture: Secondary | ICD-10-CM | POA: Diagnosis not present

## 2022-08-20 DIAGNOSIS — L820 Inflamed seborrheic keratosis: Secondary | ICD-10-CM | POA: Diagnosis not present

## 2022-08-23 DIAGNOSIS — R21 Rash and other nonspecific skin eruption: Secondary | ICD-10-CM | POA: Diagnosis not present

## 2022-09-06 ENCOUNTER — Ambulatory Visit: Payer: Medicare Other | Attending: Cardiovascular Disease

## 2022-09-06 ENCOUNTER — Telehealth: Payer: Self-pay | Admitting: Interventional Cardiology

## 2022-09-06 DIAGNOSIS — E782 Mixed hyperlipidemia: Secondary | ICD-10-CM

## 2022-09-06 DIAGNOSIS — D6869 Other thrombophilia: Secondary | ICD-10-CM

## 2022-09-06 DIAGNOSIS — I480 Paroxysmal atrial fibrillation: Secondary | ICD-10-CM

## 2022-09-06 DIAGNOSIS — R030 Elevated blood-pressure reading, without diagnosis of hypertension: Secondary | ICD-10-CM

## 2022-09-06 NOTE — Telephone Encounter (Signed)
Melissa Randolph came to the office this morning and I informed her she had an appointment for lab work.  The patient is extremely upset because she says the phone system confirmed her appointment today was to see Dr. Eldridge Dace - and not lab work.  Patient also said this office cleared her husband for surgery, and it was found out her husband had 4 "leaky valves."  Patient would like to speak with Dr. Eldridge Dace to personally make him aware of her concern and frustration.  Thank you.

## 2022-09-06 NOTE — Telephone Encounter (Signed)
Melissa Randolph called back (irate) at 10:35 to speak with me to add additional information to this telephone call documentation:  Patient told me she shouldn't have gone to the lab today without an appointment to see Dr. Eldridge Dace.  Patient says if she doesn't get an appointment to see Dr. Eldridge Dace soon, she is going to speak with her PCP to switch cardiologists.    Patient also states someone here in our office is "very sloppy."

## 2022-09-07 LAB — CBC
Hematocrit: 42.2 % (ref 34.0–46.6)
Hemoglobin: 14 g/dL (ref 11.1–15.9)
MCH: 30.3 pg (ref 26.6–33.0)
MCHC: 33.2 g/dL (ref 31.5–35.7)
MCV: 91 fL (ref 79–97)
Platelets: 300 10*3/uL (ref 150–450)
RBC: 4.62 x10E6/uL (ref 3.77–5.28)
RDW: 13.7 % (ref 11.7–15.4)
WBC: 8.3 10*3/uL (ref 3.4–10.8)

## 2022-09-07 LAB — BASIC METABOLIC PANEL
BUN/Creatinine Ratio: 19 (ref 12–28)
BUN: 19 mg/dL (ref 8–27)
CO2: 23 mmol/L (ref 20–29)
Calcium: 9.4 mg/dL (ref 8.7–10.3)
Chloride: 105 mmol/L (ref 96–106)
Creatinine, Ser: 0.99 mg/dL (ref 0.57–1.00)
Glucose: 100 mg/dL — ABNORMAL HIGH (ref 70–99)
Potassium: 3.8 mmol/L (ref 3.5–5.2)
Sodium: 145 mmol/L — ABNORMAL HIGH (ref 134–144)
eGFR: 56 mL/min/{1.73_m2} — ABNORMAL LOW (ref >59)

## 2022-09-07 LAB — DIGOXIN LEVEL: Digoxin, Serum: 0.7 ng/mL (ref 0.5–0.9)

## 2022-09-07 NOTE — Telephone Encounter (Signed)
Called and spoke with the patient.  She deactivated her patient portal today, in the meantime she received a notification on her phone that lab results are ready.  But now she cannot view them.  I did a preliminary review w her and explained that they have not been reviewed by the provider yet and once they are we will give her a call back.  She is aware he is out of the office this week.  Pt voices understanding and thanked me for calling her.

## 2022-09-07 NOTE — Telephone Encounter (Signed)
Patient would like call back today about this occurrence and would like OV.

## 2022-09-07 NOTE — Telephone Encounter (Signed)
Patient refuses to use Mychart and has declined it. Patient wants a call back with her lab results from 09/06/22.

## 2022-09-08 ENCOUNTER — Encounter: Payer: Self-pay | Admitting: Podiatry

## 2022-09-08 ENCOUNTER — Ambulatory Visit: Payer: Medicare Other | Admitting: Podiatry

## 2022-09-08 DIAGNOSIS — M79674 Pain in right toe(s): Secondary | ICD-10-CM

## 2022-09-08 DIAGNOSIS — M79671 Pain in right foot: Secondary | ICD-10-CM | POA: Diagnosis not present

## 2022-09-08 DIAGNOSIS — B351 Tinea unguium: Secondary | ICD-10-CM

## 2022-09-08 NOTE — Progress Notes (Signed)
 This patient returns to my office for at risk foot care.  This patient requires this care by a professional since this patient will be at risk due to having coagulation defect.  Patient is taking eliquiss.  This patient is unable to cut nails herself since the patient cannot reach her nails.These nails are painful walking and wearing shoes.  This patient presents for at risk foot care today. 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  General Appearance  Alert, conversant and in no acute stress.  Vascular  Dorsalis pedis and posterior tibial  pulses are palpable  bilaterally.  Capillary return is within normal limits  bilaterally. Temperature is within normal limits  bilaterally.  Neurologic  Senn-Weinstein monofilament wire test within normal limits  bilaterally. Muscle power within normal limits bilaterally.  Nails Thick disfigured discolored nails with subungual debris  first second toes toenails right foot.. No evidence of bacterial infection or drainage bilaterally.  Orthopedic  No limitations of motion  feet .  No crepitus or effusions noted.  No bony pathology or digital deformities noted.  Skin  normotropic skin with no porokeratosis noted bilaterally.  No signs of infections or ulcers noted.     Onychomycosis  Pain in right toes  Pain in left toes  Consent was obtained for treatment procedures.   Mechanical debridement of nails 1-5  bilaterally performed with a nail nipper.  Filed with dremel without incident.    Return office visit     prn             Told patient to return for periodic foot care and evaluation due to potential at risk complications.   Gardiner Barefoot DPM

## 2022-09-10 ENCOUNTER — Telehealth: Payer: Self-pay | Admitting: Interventional Cardiology

## 2022-09-10 NOTE — Telephone Encounter (Signed)
Patient is returning call for results. Transferred to [place], LPN.

## 2022-09-10 NOTE — Telephone Encounter (Signed)
See results note ./cy 

## 2022-10-30 ENCOUNTER — Other Ambulatory Visit: Payer: Self-pay | Admitting: Interventional Cardiology

## 2022-10-30 DIAGNOSIS — I480 Paroxysmal atrial fibrillation: Secondary | ICD-10-CM

## 2022-11-01 NOTE — Telephone Encounter (Signed)
Prescription refill request for Eliquis received. Indication:afib Last office visit:4/24 Scr:0.99  7/24 Age: 88 Weight:52.7  kg  Prescription refilled

## 2022-11-05 ENCOUNTER — Emergency Department (HOSPITAL_COMMUNITY): Payer: Medicare Other

## 2022-11-05 ENCOUNTER — Emergency Department (HOSPITAL_COMMUNITY)
Admission: EM | Admit: 2022-11-05 | Discharge: 2022-11-05 | Disposition: A | Discharge status: Home / Self Care | Payer: Medicare Other | Attending: Emergency Medicine | Admitting: Emergency Medicine

## 2022-11-05 ENCOUNTER — Encounter (HOSPITAL_COMMUNITY): Payer: Self-pay

## 2022-11-05 ENCOUNTER — Other Ambulatory Visit: Payer: Self-pay

## 2022-11-05 DIAGNOSIS — I10 Essential (primary) hypertension: Secondary | ICD-10-CM | POA: Diagnosis not present

## 2022-11-05 DIAGNOSIS — M25361 Other instability, right knee: Secondary | ICD-10-CM | POA: Diagnosis not present

## 2022-11-05 DIAGNOSIS — Z79899 Other long term (current) drug therapy: Secondary | ICD-10-CM | POA: Diagnosis not present

## 2022-11-05 DIAGNOSIS — S0083XA Contusion of other part of head, initial encounter: Secondary | ICD-10-CM | POA: Diagnosis not present

## 2022-11-05 DIAGNOSIS — S0990XA Unspecified injury of head, initial encounter: Secondary | ICD-10-CM | POA: Diagnosis present

## 2022-11-05 DIAGNOSIS — W01198A Fall on same level from slipping, tripping and stumbling with subsequent striking against other object, initial encounter: Secondary | ICD-10-CM | POA: Insufficient documentation

## 2022-11-05 DIAGNOSIS — S72009A Fracture of unspecified part of neck of unspecified femur, initial encounter for closed fracture: Secondary | ICD-10-CM | POA: Diagnosis not present

## 2022-11-05 DIAGNOSIS — Z043 Encounter for examination and observation following other accident: Secondary | ICD-10-CM | POA: Diagnosis not present

## 2022-11-05 DIAGNOSIS — Z7901 Long term (current) use of anticoagulants: Secondary | ICD-10-CM | POA: Diagnosis not present

## 2022-11-05 DIAGNOSIS — J45909 Unspecified asthma, uncomplicated: Secondary | ICD-10-CM | POA: Insufficient documentation

## 2022-11-05 DIAGNOSIS — M25562 Pain in left knee: Secondary | ICD-10-CM | POA: Diagnosis not present

## 2022-11-05 DIAGNOSIS — S022XXA Fracture of nasal bones, initial encounter for closed fracture: Secondary | ICD-10-CM | POA: Diagnosis not present

## 2022-11-05 DIAGNOSIS — S329XXA Fracture of unspecified parts of lumbosacral spine and pelvis, initial encounter for closed fracture: Secondary | ICD-10-CM | POA: Diagnosis not present

## 2022-11-05 DIAGNOSIS — M160 Bilateral primary osteoarthritis of hip: Secondary | ICD-10-CM | POA: Diagnosis not present

## 2022-11-05 DIAGNOSIS — S8001XA Contusion of right knee, initial encounter: Secondary | ICD-10-CM | POA: Diagnosis not present

## 2022-11-05 DIAGNOSIS — I517 Cardiomegaly: Secondary | ICD-10-CM | POA: Diagnosis not present

## 2022-11-05 DIAGNOSIS — W19XXXA Unspecified fall, initial encounter: Secondary | ICD-10-CM

## 2022-11-05 DIAGNOSIS — M25561 Pain in right knee: Secondary | ICD-10-CM | POA: Diagnosis not present

## 2022-11-05 DIAGNOSIS — I4891 Unspecified atrial fibrillation: Secondary | ICD-10-CM | POA: Diagnosis not present

## 2022-11-05 DIAGNOSIS — M170 Bilateral primary osteoarthritis of knee: Secondary | ICD-10-CM | POA: Diagnosis not present

## 2022-11-05 DIAGNOSIS — M8580 Other specified disorders of bone density and structure, unspecified site: Secondary | ICD-10-CM | POA: Diagnosis not present

## 2022-11-05 LAB — URINALYSIS, ROUTINE W REFLEX MICROSCOPIC
Bilirubin Urine: NEGATIVE
Glucose, UA: NEGATIVE mg/dL
Ketones, ur: NEGATIVE mg/dL
Nitrite: NEGATIVE
Protein, ur: NEGATIVE mg/dL
Specific Gravity, Urine: 1.004 — ABNORMAL LOW (ref 1.005–1.030)
pH: 7 (ref 5.0–8.0)

## 2022-11-05 NOTE — ED Provider Triage Note (Signed)
Emergency Medicine Provider Triage Evaluation Note  Melissa Randolph , a 88 y.o. female  was evaluated in triage.  Pt complains of s/p mechanical fall at 0900 this morning, fell forward landing on her face. Anticoagulated on eliquis, saw PCP who sent her here. Wounds to BL hands, knees, face. Last tetanus 2019. No LOC.   Review of Systems  Positive: wound Negative: Vomiting, LOC, dizzines  Physical Exam  BP (!) 156/93   Pulse 93   Temp 98 F (36.7 C) (Oral)   Resp 18   Ht 5\' 4"  (1.626 m)   Wt 53.1 kg   SpO2 99%   BMI 20.08 kg/m  Gen:   Awake, no distress   Resp:  Normal effort  MSK:   Moves extremities without difficulty  Other:    Medical Decision Making  Medically screening exam initiated at 4:03 PM.  Appropriate orders placed.  Eldred Manges was informed that the remainder of the evaluation will be completed by another provider, this initial triage assessment does not replace that evaluation, and the importance of remaining in the ED until their evaluation is complete.     Claude Manges, PA-C 11/05/22 0981

## 2022-11-05 NOTE — Discharge Instructions (Addendum)
We discussed the results of your CT scans along with images, you will need to follow-up with ENT for your nasal bone fracture.  If you experience any worsening headache, loss of consciousness, please return to the emergency department.

## 2022-11-05 NOTE — ED Provider Notes (Signed)
[place] EMERGENCY DEPARTMENT AT [facility] Provider Note   CSN: [id number] Arrival date & time: 11/05/22  1535     History HTN, Asthma, Afib Chief Complaint  Patient presents with   Melissa Randolph is a 88 y.o. female.  88 y.o female with a PMH of HTN, Asthma, Afib on Eliquis presents to the ED status with mechanical fall which occurred around 9 AM this morning.  Patient reports she was ambulating her dog when suddenly her right knee gave out and caused her to fall forward striking her face, bilateral hands, bilateral knees.  She was evaluated by her PCP Dr. Fay Records this evening who recommended she be evaluated in the emergency department.  She is endorsing pain around her face, where her wounds are present around her hands and knees.  Her last tetanus immunization was in 2019.  She did not lose consciousness during this episode.  She is denying any headache, nausea, dizziness.  The history is provided by the patient and medical records.  Fall Pertinent negatives include no chest pain, no abdominal pain and no shortness of breath.       Home Medications Prior to Admission medications   Medication Sig Start Date End Date Taking? Authorizing Provider  ARNUITY ELLIPTA 100 MCG/ACT AEPB Take 1 puff by mouth daily.    [provider]  Ascorbic Acid (VITAMIN C PO) Take 1 tablet by mouth daily. 400 mg    [provider]  azelastine (ASTELIN) 0.1 % nasal spray Place 1 spray into both nostrils 2 (two) times daily. Use in each nostril as directed    [provider]  cefadroxil (DURICEF) 500 MG capsule Take 2 capsules (1,000 mg total) by mouth 2 (two) times daily. 06/12/21   Gwyneth Sprout, MD  cholecalciferol (VITAMIN D) 400 UNITS TABS tablet Take 1,000 Units by mouth daily.     [provider]  Coenzyme Q10 (Q-SORB CO Q-10) 100 MG capsule 1 capsule with a meal Orally Once a day    [provider]  denosumab (PROLIA) 60  MG/ML SOSY injection as directed Subcutaneous 07/12/19   [provider]  digoxin (LANOXIN) 0.125 MG tablet TAKE 1 TABLET(125 MCG) BY MOUTH DAILY 06/15/22   Corky Crafts, MD  diltiazem (CARDIZEM CD) 180 MG 24 hr capsule TAKE 1 CAPSULE(180 MG) BY MOUTH IN THE MORNING AND AT BEDTIME 08/18/22   Corky Crafts, MD  diltiazem (CARDIZEM) 60 MG tablet Take 1 tablet (60 mg total) by mouth daily as needed (Take as directed). 04/23/20   Corky Crafts, MD  doxycycline (VIBRAMYCIN) 100 MG capsule Take 1 capsule (100 mg total) by mouth 2 (two) times daily. 06/12/21   Gwyneth Sprout, MD  ELIQUIS 2.5 MG TABS tablet TAKE 1 TABLET(2.5 MG) BY MOUTH TWICE DAILY 11/01/22   Corky Crafts, MD  fexofenadine (ALLEGRA) 60 MG tablet Take 60 mg by mouth daily as needed for allergies or rhinitis.    [provider]  Fish Oil OIL Take 100 mg by mouth daily.     [provider]  fluticasone (FLONASE) 50 MCG/ACT nasal spray Use each nostril twice daily for 5 days, then once daily. 01/16/18   Peyton Najjar, MD  Potassium Gluconate 550 MG TABS Take 1 tablet (550 mg total) by mouth daily. 12/17/15   Corky Crafts, MD  rosuvastatin (CRESTOR) 10 MG tablet TAKE 1 TABLET BY MOUTH THREE TIMES PER WEEK ON MONDAYS, WEDNESDAYS, AND SATURDAYS 07/14/22  Corky Crafts, MD  tretinoin (RETIN-A) 0.1 % cream Apply 1 application topically at bedtime. 09/16/19   [provider]      Allergies    Azithromycin, Ivp dye [iodinated contrast media], Polycin [bacitracin-polymyxin b], and Penicillin g    Review of Systems   Review of Systems  Constitutional:  Negative for chills and fever.  HENT:  Negative for sore throat.   Respiratory:  Negative for shortness of breath.   Cardiovascular:  Negative for chest pain.  Gastrointestinal:  Negative for abdominal pain and nausea.  Genitourinary:  Negative for flank pain.  Skin:  Positive for wound.  Neurological:  Negative for  dizziness, syncope and light-headedness.  All other systems reviewed and are negative.   Physical Exam Updated Vital Signs BP (!) 156/93   Pulse 93   Temp 98 F (36.7 C) (Oral)   Resp 18   Ht 5\' 4"  (1.626 m)   Wt 53.1 kg   SpO2 99%   BMI 20.08 kg/m  Physical Exam Vitals and nursing note reviewed.  Constitutional:      Appearance: Normal appearance.  HENT:     Head: Normocephalic.     Comments: Significant bruising under bilateral eyes.  Abrasion to the ridge of the nose, to the upper lip, to the chin.    Nose: Congestion present.     Mouth/Throat:     Mouth: Mucous membranes are moist.  Eyes:     Pupils: Pupils are equal, round, and reactive to light.  Neck:     Comments: No cervical spine tenderness.  Cardiovascular:     Rate and Rhythm: Normal rate.  Pulmonary:     Effort: Pulmonary effort is normal.     Breath sounds: No wheezing or rales.  Abdominal:     General: Abdomen is flat.     Palpations: Abdomen is soft.     Tenderness: There is no abdominal tenderness.  Musculoskeletal:     Cervical back: Normal range of motion and neck supple.  Skin:    General: Skin is warm and dry.  Neurological:     Mental Status: She is alert and oriented to person, place, and time.     Comments: Alert, oriented, thought content appropriate. Speech fluent without evidence of aphasia. Able to follow 2 step commands without difficulty.  Cranial Nerves:  II:  Peripheral visual fields grossly normal, pupils, round, reactive to light III,IV, VI: ptosis not present, extra-ocular motions intact bilaterally  V,VII: smile symmetric, facial light touch sensation equal VIII: hearing grossly normal bilaterally  IX,X: midline uvula rise  XI: bilateral shoulder shrug equal and strong XII: midline tongue extension  Motor:  5/5 in upper and lower extremities bilaterally including strong and equal grip strength and dorsiflexion/plantar flexion Sensory: light touch normal in all extremities.   Cerebellar: normal finger-to-nose with bilateral upper extremities, pronator drift negative Gait: normal gait and balance       ED Results / Procedures / Treatments   Labs (all labs ordered are listed, but only abnormal results are displayed) Labs Reviewed  COMPREHENSIVE METABOLIC PANEL  CBC  ETHANOL  URINALYSIS, ROUTINE W REFLEX MICROSCOPIC  PROTIME-INR  I-STAT CHEM 8, ED  I-STAT CG4 LACTIC ACID, ED    EKG None  Radiology DG Pelvis Portable  Result Date: 11/05/2022 CLINICAL DATA:  Trauma, fall EXAM: PORTABLE PELVIS 1-2 VIEWS COMPARISON:  None Available. FINDINGS: Osteopenia. No displaced fractures or dislocations of the pelvis or bilateral proximal femurs. Large, very dense calcific density lesion  of the left superior acetabulum and adjacent iliac wing measuring at least 3.0 cm in projection. Additional smaller lesions projecting over the right sacral ala and right ilium adjacent to the sacroiliac joint. Moderate, symmetric bilateral femoroacetabular arthrosis. Nonobstructive pattern of overlying bowel gas. IMPRESSION: 1. Osteopenia. No displaced fractures or dislocations of the pelvis or bilateral proximal femurs. Please note that plain radiographs are significantly insensitive for hip and pelvic fracture. Recommend CT to more sensitively evaluate if there is high clinical suspicion for fracture. 2. Large, very dense calcific density lesion of the left superior acetabulum and adjacent iliac wing measuring at least 3.0 cm in projection. Additional smaller lesions projecting over the right sacral ala and right ilium adjacent to the sacroiliac joint. In the absence of known, treated osseous metastatic disease, these are very likely benign incidental bone islands. Electronically Signed   By: Jearld Lesch M.D.   On: 11/05/2022 17:39   DG Knee Complete 4 Views Right  Result Date: 11/05/2022 CLINICAL DATA:  Fall, pain EXAM: RIGHT KNEE - COMPLETE 4+ VIEW; LEFT KNEE - COMPLETE 4+ VIEW  COMPARISON:  None Available. FINDINGS: No fracture or dislocation of the knees. Mild tricompartmental arthrosis, worst in the medial and patellofemoral compartments. No knee joint effusion. Soft tissues unremarkable. IMPRESSION: No fracture or dislocation of the knees. Mild tricompartmental arthrosis, worst in the medial and patellofemoral compartments. Electronically Signed   By: Jearld Lesch M.D.   On: 11/05/2022 17:34   DG Knee Complete 4 Views Left  Result Date: 11/05/2022 CLINICAL DATA:  Fall, pain EXAM: RIGHT KNEE - COMPLETE 4+ VIEW; LEFT KNEE - COMPLETE 4+ VIEW COMPARISON:  None Available. FINDINGS: No fracture or dislocation of the knees. Mild tricompartmental arthrosis, worst in the medial and patellofemoral compartments. No knee joint effusion. Soft tissues unremarkable. IMPRESSION: No fracture or dislocation of the knees. Mild tricompartmental arthrosis, worst in the medial and patellofemoral compartments. Electronically Signed   By: Jearld Lesch M.D.   On: 11/05/2022 17:34   DG Chest 2 View  Result Date: 11/05/2022 CLINICAL DATA:  Fall EXAM: CHEST - 2 VIEW COMPARISON:  None Available. FINDINGS: Cardiomegaly. Both lungs are clear. The visualized skeletal structures are unremarkable. IMPRESSION: Cardiomegaly without acute abnormality of the lungs. Electronically Signed   By: Jearld Lesch M.D.   On: 11/05/2022 17:31   CT Head Wo Contrast  Result Date: 11/05/2022 CLINICAL DATA:  Fall, facial injury EXAM: CT HEAD WITHOUT CONTRAST CT MAXILLOFACIAL WITHOUT CONTRAST CT CERVICAL SPINE WITHOUT CONTRAST TECHNIQUE: Multidetector CT imaging of the head, cervical spine, and maxillofacial structures were performed using the standard protocol without intravenous contrast. Multiplanar CT image reconstructions of the cervical spine and maxillofacial structures were also generated. RADIATION DOSE REDUCTION: This exam was performed according to the departmental dose-optimization program which includes  automated exposure control, adjustment of the mA and/or kV according to patient size and/or use of iterative reconstruction technique. COMPARISON:  None Available. FINDINGS: CT HEAD FINDINGS Brain: No evidence of acute infarction, hemorrhage, hydrocephalus, extra-axial collection or mass lesion/mass effect. Periventricular white matter hypodensity. Vascular: No hyperdense vessel or unexpected calcification. CT FACIAL BONES FINDINGS Skull: Normal. Negative for fracture or focal lesion. Facial bones: Mildly angulated fractures of the right nasal bones, of uncertain acuity (series 5, image 3). No other displaced fractures or dislocations. Sinuses/Orbits: No acute finding. Other: Soft tissue contusion of the right forehead (series 3, image 12). CT CERVICAL SPINE FINDINGS Alignment: Degenerative straightening and reversal of the normal cervical lordosis. Skull base and vertebrae: No  acute fracture. No primary bone lesion or focal pathologic process. Soft tissues and spinal canal: No prevertebral fluid or swelling. No visible canal hematoma. Disc levels: Severe multilevel disc degenerative disease and osteophytosis, worst from C4-C7. Upper chest: Negative. Other: None. IMPRESSION: 1. No acute intracranial pathology. 2. Mildly angulated fractures of the right nasal bones, of uncertain acuity. No other displaced fractures or dislocations of the facial bones. 3. Soft tissue contusion of the right forehead. 4. No fracture or static subluxation of the cervical spine. 5. Severe multilevel disc degenerative disease and osteophytosis, worst from C4-C7. Electronically Signed   By: Jearld Lesch M.D.   On: 11/05/2022 17:31   CT Cervical Spine Wo Contrast  Result Date: 11/05/2022 CLINICAL DATA:  Fall, facial injury EXAM: CT HEAD WITHOUT CONTRAST CT MAXILLOFACIAL WITHOUT CONTRAST CT CERVICAL SPINE WITHOUT CONTRAST TECHNIQUE: Multidetector CT imaging of the head, cervical spine, and maxillofacial structures were performed using  the standard protocol without intravenous contrast. Multiplanar CT image reconstructions of the cervical spine and maxillofacial structures were also generated. RADIATION DOSE REDUCTION: This exam was performed according to the departmental dose-optimization program which includes automated exposure control, adjustment of the mA and/or kV according to patient size and/or use of iterative reconstruction technique. COMPARISON:  None Available. FINDINGS: CT HEAD FINDINGS Brain: No evidence of acute infarction, hemorrhage, hydrocephalus, extra-axial collection or mass lesion/mass effect. Periventricular white matter hypodensity. Vascular: No hyperdense vessel or unexpected calcification. CT FACIAL BONES FINDINGS Skull: Normal. Negative for fracture or focal lesion. Facial bones: Mildly angulated fractures of the right nasal bones, of uncertain acuity (series 5, image 3). No other displaced fractures or dislocations. Sinuses/Orbits: No acute finding. Other: Soft tissue contusion of the right forehead (series 3, image 12). CT CERVICAL SPINE FINDINGS Alignment: Degenerative straightening and reversal of the normal cervical lordosis. Skull base and vertebrae: No acute fracture. No primary bone lesion or focal pathologic process. Soft tissues and spinal canal: No prevertebral fluid or swelling. No visible canal hematoma. Disc levels: Severe multilevel disc degenerative disease and osteophytosis, worst from C4-C7. Upper chest: Negative. Other: None. IMPRESSION: 1. No acute intracranial pathology. 2. Mildly angulated fractures of the right nasal bones, of uncertain acuity. No other displaced fractures or dislocations of the facial bones. 3. Soft tissue contusion of the right forehead. 4. No fracture or static subluxation of the cervical spine. 5. Severe multilevel disc degenerative disease and osteophytosis, worst from C4-C7. Electronically Signed   By: Jearld Lesch M.D.   On: 11/05/2022 17:31   CT Maxillofacial Wo  Contrast  Result Date: 11/05/2022 CLINICAL DATA:  Fall, facial injury EXAM: CT HEAD WITHOUT CONTRAST CT MAXILLOFACIAL WITHOUT CONTRAST CT CERVICAL SPINE WITHOUT CONTRAST TECHNIQUE: Multidetector CT imaging of the head, cervical spine, and maxillofacial structures were performed using the standard protocol without intravenous contrast. Multiplanar CT image reconstructions of the cervical spine and maxillofacial structures were also generated. RADIATION DOSE REDUCTION: This exam was performed according to the departmental dose-optimization program which includes automated exposure control, adjustment of the mA and/or kV according to patient size and/or use of iterative reconstruction technique. COMPARISON:  None Available. FINDINGS: CT HEAD FINDINGS Brain: No evidence of acute infarction, hemorrhage, hydrocephalus, extra-axial collection or mass lesion/mass effect. Periventricular white matter hypodensity. Vascular: No hyperdense vessel or unexpected calcification. CT FACIAL BONES FINDINGS Skull: Normal. Negative for fracture or focal lesion. Facial bones: Mildly angulated fractures of the right nasal bones, of uncertain acuity (series 5, image 3). No other displaced fractures or dislocations. Sinuses/Orbits: No acute finding.  Other: Soft tissue contusion of the right forehead (series 3, image 12). CT CERVICAL SPINE FINDINGS Alignment: Degenerative straightening and reversal of the normal cervical lordosis. Skull base and vertebrae: No acute fracture. No primary bone lesion or focal pathologic process. Soft tissues and spinal canal: No prevertebral fluid or swelling. No visible canal hematoma. Disc levels: Severe multilevel disc degenerative disease and osteophytosis, worst from C4-C7. Upper chest: Negative. Other: None. IMPRESSION: 1. No acute intracranial pathology. 2. Mildly angulated fractures of the right nasal bones, of uncertain acuity. No other displaced fractures or dislocations of the facial bones. 3. Soft  tissue contusion of the right forehead. 4. No fracture or static subluxation of the cervical spine. 5. Severe multilevel disc degenerative disease and osteophytosis, worst from C4-C7. Electronically Signed   By: Jearld Lesch M.D.   On: 11/05/2022 17:31    Procedures Procedures    Medications Ordered in ED Medications - No data to display  ED Course/ Medical Decision Making/ A&P                                 Medical Decision Making Amount and/or Complexity of Data Reviewed Labs: ordered. Radiology: ordered.   Patient here status post mechanical fall while ambulating her dog which occurred around 9 AM this morning, she is on Eliquis due to an underlying history of A-fib.  Less than this immunization was in 2019, evaluated by her primary care physician and sent to the emergency department for further management.  Here she denies any loss of consciousness, her vitals are within normal limits, she is ambulatory with a steady gait.  Significant bruising noted to under bilateral eyes.  Also complains of pain to bilateral knees which have been Ace wrapped.  IMPRESSION:  1. Osteopenia. No displaced fractures or dislocations of the pelvis  or bilateral proximal femurs. Please note that plain radiographs are  significantly insensitive for hip and pelvic fracture. Recommend CT  to more sensitively evaluate if there is high clinical suspicion for  fracture.  2. Large, very dense calcific density lesion of the left superior  acetabulum and adjacent iliac wing measuring at least 3.0 cm in  projection. Additional smaller lesions projecting over the right  sacral ala and right ilium adjacent to the sacroiliac joint. In the  absence of known, treated osseous metastatic disease, these are very  likely benign incidental bone islands.   CT head, cervical spine, maxillofacial showed 1. No acute intracranial pathology.  2. Mildly angulated fractures of the right nasal bones, of uncertain  acuity. No  other displaced fractures or dislocations of the facial  bones.  3. Soft tissue contusion of the right forehead.  4. No fracture or static subluxation of the cervical spine.  5. Severe multilevel disc degenerative disease and osteophytosis,  worst from C4-C7.   X-ray of her chest without any rib fractures, pleural effusions noted.  All these results were discussed at length with patient, she will continue to follow-up with primary care physician.  May do ice or heat to help with pain control.  We tried to obtain labs on today's visit however patient refused at this time.  She is hemodynamically stable, no underlying fracture noted aside from her nasal bone fractures which she will see ENT.  I do feel that patient is appropriate for outpatient management.  Return precautions discussed at length.    Portions of this note were generated with Scientist, clinical (histocompatibility and immunogenetics).  Dictation errors may occur despite best attempts at proofreading.   Final Clinical Impression(s) / ED Diagnoses Final diagnoses:  Fall, initial encounter  Closed fracture of nasal bone, initial encounter    Rx / DC Orders ED Discharge Orders     None         Claude Manges, PA-C 11/05/22 1837    Charlynne Pander, MD 11/05/22 2322

## 2022-11-05 NOTE — ED Triage Notes (Signed)
Mechanical fall at home today at 0900. Pt states right knee gave out and caused her to fall. Hit face and has abrasions to nose, chin, and forehead. Hematoma to forehead. Pt is on eliquis. No LOC.

## 2022-11-09 ENCOUNTER — Other Ambulatory Visit: Payer: Self-pay | Admitting: Physician Assistant

## 2022-11-09 DIAGNOSIS — W19XXXD Unspecified fall, subsequent encounter: Secondary | ICD-10-CM | POA: Diagnosis not present

## 2022-11-09 DIAGNOSIS — S0083XD Contusion of other part of head, subsequent encounter: Secondary | ICD-10-CM | POA: Diagnosis not present

## 2022-11-09 DIAGNOSIS — M899 Disorder of bone, unspecified: Secondary | ICD-10-CM

## 2022-11-12 ENCOUNTER — Ambulatory Visit
Admission: RE | Admit: 2022-11-12 | Discharge: 2022-11-12 | Disposition: A | Discharge status: Home / Self Care | Payer: Medicare Other | Source: Ambulatory Visit | Attending: Physician Assistant | Admitting: Physician Assistant

## 2022-11-12 DIAGNOSIS — I700 Atherosclerosis of aorta: Secondary | ICD-10-CM | POA: Diagnosis not present

## 2022-11-12 DIAGNOSIS — M899 Disorder of bone, unspecified: Secondary | ICD-10-CM

## 2022-11-12 DIAGNOSIS — M25559 Pain in unspecified hip: Secondary | ICD-10-CM | POA: Diagnosis not present

## 2022-11-29 DIAGNOSIS — M1712 Unilateral primary osteoarthritis, left knee: Secondary | ICD-10-CM | POA: Diagnosis not present

## 2022-11-29 DIAGNOSIS — M170 Bilateral primary osteoarthritis of knee: Secondary | ICD-10-CM | POA: Diagnosis not present

## 2022-11-29 DIAGNOSIS — M1711 Unilateral primary osteoarthritis, right knee: Secondary | ICD-10-CM | POA: Diagnosis not present

## 2022-12-28 DIAGNOSIS — M25551 Pain in right hip: Secondary | ICD-10-CM | POA: Diagnosis not present

## 2022-12-28 DIAGNOSIS — M25561 Pain in right knee: Secondary | ICD-10-CM | POA: Diagnosis not present

## 2023-01-03 DIAGNOSIS — M1990 Unspecified osteoarthritis, unspecified site: Secondary | ICD-10-CM | POA: Diagnosis not present

## 2023-01-03 DIAGNOSIS — Z23 Encounter for immunization: Secondary | ICD-10-CM | POA: Diagnosis not present

## 2023-01-03 DIAGNOSIS — Z0000 Encounter for general adult medical examination without abnormal findings: Secondary | ICD-10-CM | POA: Diagnosis not present

## 2023-01-03 DIAGNOSIS — D6869 Other thrombophilia: Secondary | ICD-10-CM | POA: Diagnosis not present

## 2023-01-03 DIAGNOSIS — R600 Localized edema: Secondary | ICD-10-CM | POA: Diagnosis not present

## 2023-01-03 DIAGNOSIS — M810 Age-related osteoporosis without current pathological fracture: Secondary | ICD-10-CM | POA: Diagnosis not present

## 2023-01-03 DIAGNOSIS — I4891 Unspecified atrial fibrillation: Secondary | ICD-10-CM | POA: Diagnosis not present

## 2023-01-03 DIAGNOSIS — I700 Atherosclerosis of aorta: Secondary | ICD-10-CM | POA: Diagnosis not present

## 2023-01-03 DIAGNOSIS — E782 Mixed hyperlipidemia: Secondary | ICD-10-CM | POA: Diagnosis not present

## 2023-01-03 DIAGNOSIS — J4520 Mild intermittent asthma, uncomplicated: Secondary | ICD-10-CM | POA: Diagnosis not present

## 2023-01-31 DIAGNOSIS — H35371 Puckering of macula, right eye: Secondary | ICD-10-CM | POA: Diagnosis not present

## 2023-01-31 DIAGNOSIS — H26491 Other secondary cataract, right eye: Secondary | ICD-10-CM | POA: Diagnosis not present

## 2023-01-31 DIAGNOSIS — H43393 Other vitreous opacities, bilateral: Secondary | ICD-10-CM | POA: Diagnosis not present

## 2023-01-31 DIAGNOSIS — Z961 Presence of intraocular lens: Secondary | ICD-10-CM | POA: Diagnosis not present

## 2023-02-03 DIAGNOSIS — L821 Other seborrheic keratosis: Secondary | ICD-10-CM | POA: Diagnosis not present

## 2023-02-03 DIAGNOSIS — Z85828 Personal history of other malignant neoplasm of skin: Secondary | ICD-10-CM | POA: Diagnosis not present

## 2023-02-03 DIAGNOSIS — B078 Other viral warts: Secondary | ICD-10-CM | POA: Diagnosis not present

## 2023-02-03 DIAGNOSIS — D692 Other nonthrombocytopenic purpura: Secondary | ICD-10-CM | POA: Diagnosis not present

## 2023-02-03 DIAGNOSIS — D1801 Hemangioma of skin and subcutaneous tissue: Secondary | ICD-10-CM | POA: Diagnosis not present

## 2023-02-03 DIAGNOSIS — L570 Actinic keratosis: Secondary | ICD-10-CM | POA: Diagnosis not present

## 2023-02-03 DIAGNOSIS — L820 Inflamed seborrheic keratosis: Secondary | ICD-10-CM | POA: Diagnosis not present

## 2023-03-25 DIAGNOSIS — I10 Essential (primary) hypertension: Secondary | ICD-10-CM | POA: Diagnosis not present

## 2023-03-25 DIAGNOSIS — J329 Chronic sinusitis, unspecified: Secondary | ICD-10-CM | POA: Diagnosis not present

## 2023-03-25 DIAGNOSIS — I4891 Unspecified atrial fibrillation: Secondary | ICD-10-CM | POA: Diagnosis not present

## 2023-04-08 DIAGNOSIS — M85852 Other specified disorders of bone density and structure, left thigh: Secondary | ICD-10-CM | POA: Diagnosis not present

## 2023-04-08 DIAGNOSIS — Z8489 Family history of other specified conditions: Secondary | ICD-10-CM | POA: Diagnosis not present

## 2023-04-19 DIAGNOSIS — L738 Other specified follicular disorders: Secondary | ICD-10-CM | POA: Diagnosis not present

## 2023-05-06 ENCOUNTER — Encounter: Payer: Self-pay | Admitting: Podiatry

## 2023-05-06 ENCOUNTER — Ambulatory Visit: Admitting: Podiatry

## 2023-05-06 DIAGNOSIS — M79674 Pain in right toe(s): Secondary | ICD-10-CM | POA: Diagnosis not present

## 2023-05-06 DIAGNOSIS — M79671 Pain in right foot: Secondary | ICD-10-CM

## 2023-05-06 DIAGNOSIS — B351 Tinea unguium: Secondary | ICD-10-CM

## 2023-05-06 NOTE — Progress Notes (Signed)
 This patient returns to my office for at risk foot care.  This patient requires this care by a professional since this patient will be at risk due to having coagulation defect.  Patient is taking eliquiss.  This patient is unable to cut nails herself since the patient cannot reach her nails.These nails are painful walking and wearing shoes.  This patient presents for at risk foot care today. 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  General Appearance  Alert, conversant and in no acute stress.  Vascular  Dorsalis pedis and posterior tibial  pulses are palpable  bilaterally.  Capillary return is within normal limits  bilaterally. Temperature is within normal limits  bilaterally.  Neurologic  Senn-Weinstein monofilament wire test within normal limits  bilaterally. Muscle power within normal limits bilaterally.  Nails Thick disfigured discolored nails with subungual debris  first second toes toenails right foot.. No evidence of bacterial infection or drainage bilaterally.  Orthopedic  No limitations of motion  feet .  No crepitus or effusions noted.  No bony pathology or digital deformities noted.  Skin  normotropic skin with no porokeratosis noted bilaterally.  No signs of infections or ulcers noted.     Onychomycosis  Pain in right toes  Pain in left toes  Consent was obtained for treatment procedures.   Mechanical debridement of nails 1-5  bilaterally performed with a nail nipper.  Filed with dremel without incident.    Return office visit     6 months             Told patient to return for periodic foot care and evaluation due to potential at risk complications.   Helane Gunther DPM

## 2023-05-07 ENCOUNTER — Other Ambulatory Visit: Payer: Self-pay | Admitting: Interventional Cardiology

## 2023-05-07 DIAGNOSIS — I480 Paroxysmal atrial fibrillation: Secondary | ICD-10-CM

## 2023-05-09 NOTE — Telephone Encounter (Signed)
 Prescription refill request for Eliquis received. Indication:afib Last office visit:4/24 Scr:0.99  7/24 Age: 89 Weight:53.1  kg  Prescription refilled

## 2023-06-15 DIAGNOSIS — M1990 Unspecified osteoarthritis, unspecified site: Secondary | ICD-10-CM | POA: Diagnosis not present

## 2023-07-04 DIAGNOSIS — M810 Age-related osteoporosis without current pathological fracture: Secondary | ICD-10-CM | POA: Diagnosis not present

## 2023-07-04 DIAGNOSIS — R600 Localized edema: Secondary | ICD-10-CM | POA: Diagnosis not present

## 2023-07-04 DIAGNOSIS — E782 Mixed hyperlipidemia: Secondary | ICD-10-CM | POA: Diagnosis not present

## 2023-07-04 DIAGNOSIS — R944 Abnormal results of kidney function studies: Secondary | ICD-10-CM | POA: Diagnosis not present

## 2023-07-04 DIAGNOSIS — M1990 Unspecified osteoarthritis, unspecified site: Secondary | ICD-10-CM | POA: Diagnosis not present

## 2023-07-04 DIAGNOSIS — I4891 Unspecified atrial fibrillation: Secondary | ICD-10-CM | POA: Diagnosis not present

## 2023-07-04 DIAGNOSIS — J4520 Mild intermittent asthma, uncomplicated: Secondary | ICD-10-CM | POA: Diagnosis not present

## 2023-07-04 DIAGNOSIS — J309 Allergic rhinitis, unspecified: Secondary | ICD-10-CM | POA: Diagnosis not present

## 2023-08-08 DIAGNOSIS — L814 Other melanin hyperpigmentation: Secondary | ICD-10-CM | POA: Diagnosis not present

## 2023-08-08 DIAGNOSIS — L821 Other seborrheic keratosis: Secondary | ICD-10-CM | POA: Diagnosis not present

## 2023-08-08 DIAGNOSIS — Z85828 Personal history of other malignant neoplasm of skin: Secondary | ICD-10-CM | POA: Diagnosis not present

## 2023-08-08 DIAGNOSIS — L820 Inflamed seborrheic keratosis: Secondary | ICD-10-CM | POA: Diagnosis not present

## 2023-08-08 DIAGNOSIS — C44519 Basal cell carcinoma of skin of other part of trunk: Secondary | ICD-10-CM | POA: Diagnosis not present

## 2023-08-08 DIAGNOSIS — S80811A Abrasion, right lower leg, initial encounter: Secondary | ICD-10-CM | POA: Diagnosis not present

## 2023-08-08 DIAGNOSIS — L570 Actinic keratosis: Secondary | ICD-10-CM | POA: Diagnosis not present

## 2023-09-07 ENCOUNTER — Other Ambulatory Visit: Payer: Self-pay | Admitting: Interventional Cardiology

## 2023-09-08 ENCOUNTER — Other Ambulatory Visit: Payer: Self-pay | Admitting: Cardiology

## 2023-10-13 DIAGNOSIS — C44319 Basal cell carcinoma of skin of other parts of face: Secondary | ICD-10-CM | POA: Diagnosis not present

## 2023-10-18 ENCOUNTER — Ambulatory Visit: Admitting: Podiatry

## 2023-10-18 ENCOUNTER — Encounter: Payer: Self-pay | Admitting: Podiatry

## 2023-10-18 DIAGNOSIS — B351 Tinea unguium: Secondary | ICD-10-CM | POA: Diagnosis not present

## 2023-10-18 DIAGNOSIS — M79674 Pain in right toe(s): Secondary | ICD-10-CM

## 2023-10-18 DIAGNOSIS — M79671 Pain in right foot: Secondary | ICD-10-CM

## 2023-10-18 NOTE — Progress Notes (Signed)
 This patient returns to my office for at risk foot care.  This patient requires this care by a professional since this patient will be at risk due to having coagulation defect.  Patient is taking eliquiss.  This patient is unable to cut nails herself since the patient cannot reach her nails.These nails are painful walking and wearing shoes.  This patient presents for at risk foot care today. 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  General Appearance  Alert, conversant and in no acute stress.  Vascular  Dorsalis pedis and posterior tibial  pulses are palpable  bilaterally.  Capillary return is within normal limits  bilaterally. Temperature is within normal limits  bilaterally.  Neurologic  Senn-Weinstein monofilament wire test within normal limits  bilaterally. Muscle power within normal limits bilaterally.  Nails Thick disfigured discolored nails with subungual debris  first second toes toenails right foot.. No evidence of bacterial infection or drainage bilaterally.  Orthopedic  No limitations of motion  feet .  No crepitus or effusions noted.  No bony pathology or digital deformities noted.  Skin  normotropic skin with no porokeratosis noted bilaterally.  No signs of infections or ulcers noted.     Onychomycosis  Pain in right toes  Pain in left toes  Consent was obtained for treatment procedures.   Mechanical debridement of nails 1-5  bilaterally performed with a nail nipper.  Filed with dremel without incident.    Return office visit     6 months             Told patient to return for periodic foot care and evaluation due to potential at risk complications.   Helane Gunther DPM

## 2023-11-02 DIAGNOSIS — C441121 Basal cell carcinoma of skin of right upper eyelid, including canthus: Secondary | ICD-10-CM | POA: Diagnosis not present

## 2023-11-03 DIAGNOSIS — Z23 Encounter for immunization: Secondary | ICD-10-CM | POA: Diagnosis not present

## 2023-11-08 DIAGNOSIS — L0889 Other specified local infections of the skin and subcutaneous tissue: Secondary | ICD-10-CM | POA: Diagnosis not present

## 2023-11-08 DIAGNOSIS — L089 Local infection of the skin and subcutaneous tissue, unspecified: Secondary | ICD-10-CM | POA: Diagnosis not present

## 2023-11-18 ENCOUNTER — Other Ambulatory Visit: Payer: Self-pay | Admitting: Interventional Cardiology

## 2023-11-18 ENCOUNTER — Telehealth: Payer: Self-pay | Admitting: Physician Assistant

## 2023-11-18 DIAGNOSIS — I480 Paroxysmal atrial fibrillation: Secondary | ICD-10-CM

## 2023-11-18 NOTE — Telephone Encounter (Addendum)
 Eliquis  2.5mg  refill request received. Patient is 89 years old, weight-53.1kg, Crea-0.95 on 01/04/23 via [facility] at [place], [place], and last seen by Dr. Dann on 06/07/22 and needs an appointment. Dose is appropriate based on dosing criteria.   Called patient to let her know she needs an appointment and transferred her to the Schedulers. She has an appointment with Dayna Dunn on 12/07/23. Will send refill at this time.

## 2023-11-18 NOTE — Telephone Encounter (Signed)
 Please see eliquis  refill encounter from today. Spoke with pt and was able to get an appointment in October and labs are in [facility] Every[facility].

## 2023-11-18 NOTE — Telephone Encounter (Signed)
 Prescription refill request for Eliquis  received. Indication: Afib Last office visit: 06/07/22 Scr: No recent labs within last year Age: 89 Weight: 117 lbs  Pt has no recent labs nor ov within last year. Last seen by [place]

## 2023-11-18 NOTE — Telephone Encounter (Signed)
*  STAT* If patient is at the pharmacy, call can be transferred to refill team.   1. Which medications need to be refilled? (please list name of each medication and dose if known)   ELIQUIS  2.5 MG TABS tablet     2. Would you like to learn more about the convenience, safety, & potential cost savings by using the [facility]? No   3. Are you open to using the [facility] (Type [facility].) No   4. Which pharmacy/location (including street and city if local pharmacy) is medication to be sent to?  [id number] - [place], [place] - [address] AT [facility] OF [address]   5. Do they need a 30 day or 90 day supply? 30 day  Pt has scheduled appt on 10/15

## 2023-11-25 DIAGNOSIS — Z1231 Encounter for screening mammogram for malignant neoplasm of breast: Secondary | ICD-10-CM | POA: Diagnosis not present

## 2023-11-28 DIAGNOSIS — Z1231 Encounter for screening mammogram for malignant neoplasm of breast: Secondary | ICD-10-CM | POA: Diagnosis not present

## 2023-12-07 ENCOUNTER — Ambulatory Visit: Admitting: Physician Assistant

## 2023-12-11 NOTE — Progress Notes (Unsigned)
 Cardiology Clinic Note   Patient Name: Melissa Randolph Date of Encounter: 12/13/2023  Primary Care Provider:  Seabron Lenis, MD Primary Cardiologist:  Candyce Reek, MD  Patient Profile    Melissa Randolph 89 year old female presents to the clinic today for follow-up evaluation of her hypertension, and paroxysmal atrial fibrillation.  Past Medical History    Past Medical History:  Diagnosis Date   Allergy    Asthma    Atrial fibrillation (HCC)    Heart murmur    HTN (hypertension)    Hypercholesteremia    Mixed hyperlipidemia    Palpitation    Past Surgical History:  Procedure Laterality Date   BREAST SURGERY      Allergies  Allergies  Allergen Reactions   Azithromycin Other (See Comments)    Unknown  Other Reaction(s): unknown   Ivp Dye [Iodinated Contrast Media] Other (See Comments)    Unknown   Polycin [Bacitracin-Polymyxin B] Other (See Comments)    Unknown   Penicillin G Rash    History of Present Illness    Melissa Randolph has a PMH of atrial fibrillation, hypertension, hyperlipidemia.  She was previously followed by Dr.Varanasi.  Her symptoms were well-controlled with Cardizem .  She previously noted palpitations during stressful situations.  She wore a cardiac event monitor in 2013.  It showed a 3% A-fib burden.  She takes an extra dose of diltiazem  for palpitations.  She also premedicated with extra diltiazem  prior to stressful situations which has allowed her to avoid A-fib symptoms.  She previously wished to decline anticoagulation.  She was found to be in atrial fibrillation and agreed to low-dose Eliquis  10/19.  She continued to rarely use as needed diltiazem .  Post COVID she continued to be physically active playing pickle ball, walking, and doing yard work.  She was trying to do exercise indoors due to prior basal cell CA which was removed from her face.  She was last seen by Dr.Varanasi 06/07/22.  During that time she continued to do  yoga.  She was taking care of her husband post hip surgery.  She was able to climb stairs without difficulty.  She denied bleeding issues.  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  She presents to the clinic today for follow-up evaluation and states she continues to be very physically active.  She continues to walk her dog 1 mile 2 times daily.  She continues to play organ at church.  She does not notice palpitations unless she is laying on her left side.  She notes that they do come and go however.  We reviewed her previous cardiac history.  She expressed understanding.  She notes that her PCP previously filled all of her medications and then told her to follow-up with cardiology for subsequent refills.  I will order a digoxin  level today, CBC, and refill her medication.  Will plan follow-up in 9 to 12 months..  Today she denies chest pain, shortness of breath, lower extremity edema, fatigue, palpitations, melena, hematuria, hemoptysis, diaphoresis, weakness, presyncope, syncope, orthopnea, and PND.   Home Medications    Prior to Admission medications   Medication Sig Start Date End Date Taking? Authorizing Provider  ARNUITY ELLIPTA 100 MCG/ACT AEPB Take 1 puff by mouth daily.    [provider]  Ascorbic Acid (VITAMIN C PO) Take 1 tablet by mouth daily. 400 mg    [provider]  azelastine (ASTELIN) 0.1 % nasal spray Place 1 spray into both nostrils 2 (  two) times daily. Use in each nostril as directed    [provider]  cefadroxil  (DURICEF) 500 MG capsule Take 2 capsules (1,000 mg total) by mouth 2 (two) times daily. 06/12/21   Doretha Folks, MD  cholecalciferol (VITAMIN D) 400 UNITS TABS tablet Take 1,000 Units by mouth daily.     [provider]  Coenzyme Q10 (Q-SORB CO Q-10) 100 MG capsule 1 capsule with a meal Orally Once a day    [provider]  denosumab  (PROLIA ) 60 MG/ML SOSY injection as directed Subcutaneous 07/12/19   [provider]  digoxin  (LANOXIN ) 0.125 MG tablet TAKE 1 TABLET(125 MCG) BY MOUTH DAILY 06/15/22   Dann Candyce RAMAN, MD  diltiazem  (CARDIZEM  CD) 180 MG 24 hr capsule TAKE 1 CAPSULE(180 MG) BY MOUTH IN THE MORNING AND AT BEDTIME 08/18/22   Dann Candyce RAMAN, MD  diltiazem  (CARDIZEM ) 60 MG tablet Take 1 tablet (60 mg total) by mouth daily as needed (Take as directed). 04/23/20   Dann Candyce RAMAN, MD  doxycycline  (VIBRAMYCIN ) 100 MG capsule Take 1 capsule (100 mg total) by mouth 2 (two) times daily. 06/12/21   Doretha Folks, MD  ELIQUIS  2.5 MG TABS tablet TAKE 1 TABLET(2.5 MG) BY MOUTH TWICE DAILY 11/18/23   Dunn, Dayna N, PA-C  fexofenadine (ALLEGRA) 60 MG tablet Take 60 mg by mouth daily as needed for allergies or rhinitis.    [provider]  Fish Oil OIL Take 100 mg by mouth daily.     [provider]  fluticasone  (FLONASE ) 50 MCG/ACT nasal spray Use each nostril twice daily for 5 days, then once daily. 01/16/18   Tish Alm DEL, MD  Potassium Gluconate 550 MG TABS Take 1 tablet (550 mg total) by mouth daily. 12/17/15   Dann Candyce RAMAN, MD  rosuvastatin  (CRESTOR ) 10 MG tablet TAKE 1 TABLET BY MOUTH THREE TIMES PER WEEK ON MONDAYS, WEDNESDAYS, AND FRIDAYS. Please call [phone] to schedule an appointment for future refills. Thank you. 1st attempt. 09/08/23   Tolia, Sunit, DO  tretinoin (RETIN-A) 0.1 % cream Apply 1 application topically at bedtime. 09/16/19   [provider]    Family History    Family History  Problem Relation Age of Onset   Hyperlipidemia Mother    Stroke Mother    Cancer Father    Heart attack Neg Hx    Hypertension Neg Hx    She indicated that her mother is deceased. She indicated that her father is deceased. She indicated that her maternal grandmother is deceased. She indicated that her maternal grandfather is deceased. She indicated that her paternal grandmother is deceased. She indicated that her paternal grandfather is  deceased. She indicated that the status of her neg hx is unknown.  Social History    Social History   Socioeconomic History   Marital status: Married    Spouse name: Not on file   Number of children: Not on file   Years of education: Not on file   Highest education level: Not on file  Occupational History   Not on file  Tobacco Use   Smoking status: Never   Smokeless tobacco: Never  Vaping Use   Vaping status: Never Used  Substance and Sexual Activity   Alcohol use: Never   Drug use: Never   Sexual activity: Not on file  Other Topics Concern   Not on file  Social History Narrative   Not on file   Social Drivers of Corporate investment banker  Strain: Not on file  Food Insecurity: Not on file  Transportation Needs: Not on file  Physical Activity: Not on file  Stress: Not on file  Social Connections: Not on file  Intimate Partner Violence: Not on file     Review of Systems    General:  No chills, fever, night sweats or weight changes.  Cardiovascular:  No chest pain, dyspnea on exertion, edema, orthopnea, palpitations, paroxysmal nocturnal dyspnea. Dermatological: No rash, lesions/masses Respiratory: No cough, dyspnea Urologic: No hematuria, dysuria Abdominal:   No nausea, vomiting, diarrhea, bright red blood per rectum, melena, or hematemesis Neurologic:  No visual changes, wkns, changes in mental status. All other systems reviewed and are otherwise negative except as noted above.  Physical Exam    VS:  BP (!) 142/64   Pulse 79   Ht 5' 4 (1.626 m)   Wt 117 lb (53.1 kg)   SpO2 97%   BMI 20.08 kg/m  , BMI Body mass index is 20.08 kg/m. GEN: Well nourished, well developed, in no acute distress. HEENT: normal. Neck: Supple, no JVD, carotid bruits, or masses. Cardiac: RRR, no murmurs, rubs, or gallops. No clubbing, cyanosis, edema.  Radials/DP/PT 2+ and equal bilaterally.  Respiratory:  Respirations regular and unlabored, clear to auscultation  bilaterally. GI: Soft, nontender, nondistended, BS + x 4. MS: no deformity or atrophy. Skin: warm and dry, no rash. Neuro:  Strength and sensation are intact. Psych: Normal affect.  Accessory Clinical Findings    Recent Labs: No results found for requested labs within last 365 days.   Recent Lipid Panel    Component Value Date/Time   CHOL 180 12/20/2016 0841   TRIG 143 12/20/2016 0841   HDL 61 12/20/2016 0841   CHOLHDL 3.0 12/20/2016 0841   CHOLHDL 3.0 12/18/2015 0739   VLDL 23 12/18/2015 0739   LDLCALC 90 12/20/2016 0841    HYPERTENSION CONTROL Vitals:   12/13/23 1410 12/13/23 1430 12/13/23 1439  BP: (!) 170/71 (!) 142/64 (!) 142/64    The patient's blood pressure is elevated above target today.  In order to address the patient's elevated BP: Blood pressure will be monitored at home to determine if medication changes need to be made.; The blood pressure is usually elevated in clinic.  Blood pressures monitored at home have been optimal.     Known whitecoat hypertension.  Reports blood pressure of 135/71 today.  ECG personally reviewed by me today- EKG Interpretation Date/Time:  Tuesday December 13 2023 14:08:26 EDT Ventricular Rate:  75 PR Interval:    QRS Duration:  120 QT Interval:  392 QTC Calculation: 437 R Axis:   85  Text Interpretation: Atrial fibrillation Right bundle branch block Septal infarct , age undetermined ST & T wave abnormality, consider lateral ischemia When compared with ECG of 23-Mar-2003 11:05, Atrial fibrillation has replaced Sinus rhythm Right bundle branch block is now Present Septal infarct is now Present Confirmed by Emelia Hazy [phone]) on 12/13/2023 2:16:34 PM         Assessment & Plan   1.  Paroxysmal atrial fibrillation-EKG today shows atrial fibrillation 79 bpm.  Reports compliance with apixaban .  Denies bleeding issues. Heart healthy low-sodium diet Avoid triggers caffeine, chocolate, EtOH, dehydration excetra. Continue  Eliquis , diltiazem , digoxin  Order CBC, digoxin  level Will refill cardiac medications  Essential hypertension-BP today 135/71.  Known whitecoat hypertension.  Maintains blood pressure log.. Maintain blood pressure log Heart healthy low-sodium diet Continue current medical therapy  Hyperlipidemia-LDL 108 07/04/23. High-fiber diet Continue rosuvastatin   Palpitations-denies recent episodes of palpitations and A-fib symptoms.  Previously noted to have PACs. Continue diltiazem   Disposition: Follow-up with Dr. Acharya or me in 9-12 months.   Melissa Randolph. Melissa Fuchs NP-C     12/13/2023, 2:39 PM [facility] [address] [place], [place] [address] Office [phone]    Notice: This dictation was prepared with Dragon dictation along with smaller phrase technology. Any transcriptional errors that result from this process are unintentional and may not be corrected upon review.   I spent 14 minutes examining this patient, reviewing medications, and using patient centered shared decision making involving their cardiac care.   I spent  20 minutes reviewing past medical history,  medications, and prior cardiac tests.

## 2023-12-13 ENCOUNTER — Ambulatory Visit: Attending: General Practice | Admitting: General Practice

## 2023-12-13 ENCOUNTER — Encounter: Payer: Self-pay | Admitting: General Practice

## 2023-12-13 VITALS — BP 142/64 | HR 79 | Ht 64.0 in | Wt 117.0 lb

## 2023-12-13 DIAGNOSIS — R002 Palpitations: Secondary | ICD-10-CM | POA: Diagnosis not present

## 2023-12-13 DIAGNOSIS — E782 Mixed hyperlipidemia: Secondary | ICD-10-CM | POA: Diagnosis not present

## 2023-12-13 DIAGNOSIS — I480 Paroxysmal atrial fibrillation: Secondary | ICD-10-CM | POA: Diagnosis not present

## 2023-12-13 DIAGNOSIS — I10 Essential (primary) hypertension: Secondary | ICD-10-CM | POA: Diagnosis not present

## 2023-12-13 MED ORDER — APIXABAN 2.5 MG PO TABS: 2.5000 mg | ORAL_TABLET | Freq: Two times a day (BID) | ORAL | 1 refills | Status: AC

## 2023-12-13 MED ORDER — DILTIAZEM HCL 60 MG PO TABS: 60.0000 mg | ORAL_TABLET | Freq: Every day | ORAL | 3 refills | Status: AC | PRN

## 2023-12-13 MED ORDER — ROSUVASTATIN CALCIUM 10 MG PO TABS: ORAL_TABLET | ORAL | 0 refills | Status: DC

## 2023-12-13 MED ORDER — DILTIAZEM HCL ER COATED BEADS 180 MG PO CP24: 180.0000 mg | ORAL_CAPSULE | Freq: Every day | ORAL | 2 refills | Status: AC

## 2023-12-13 MED ORDER — DIGOXIN 125 MCG PO TABS: 0.1250 mg | ORAL_TABLET | Freq: Every day | ORAL | 3 refills | Status: AC

## 2023-12-13 NOTE — Patient Instructions (Signed)
 Thank you for choosing [facility]!     Medication Instructions:  Your refills have been sent to your pharmacy. *If you need a refill on your cardiac medications before your next appointment, please call your pharmacy*   Lab Work: CBC, Digoxin  If you have labs (blood work) drawn today and your tests are completely normal, you will receive your results only by: MyChart Message (if you have MyChart) OR A paper copy in the mail If you have any lab test that is abnormal or we need to change your treatment, we will call you to review the results.   Testing/Procedures: No procedures were ordered during today's visit.   Your next appointment:   25month(s)   Provider:   Dr. Loni or Josefa Beauvais     Follow-Up: At [facility], you and your health needs are our priority.  As part of our continuing mission to provide you with exceptional heart care, we have created designated Provider Care Teams.  These Care Teams include your primary Cardiologist (physician) and Advanced Practice Providers (APPs -  Physician Assistants and Nurse Practitioners) who all work together to provide you with the care you need, when you need it. We recommend signing up for the patient portal called MyChart.  Sign up information is provided on this After Visit Summary.  MyChart is used to connect with patients for Virtual Visits (Telemedicine).  Patients are able to view lab/test results, encounter notes, upcoming appointments, etc.  Non-urgent messages can be sent to your provider as well.   To learn more about what you can do with MyChart, go to [email or web address].

## 2023-12-14 ENCOUNTER — Ambulatory Visit: Payer: Self-pay | Admitting: General Practice

## 2023-12-14 LAB — CBC
Hematocrit: 43.3 % (ref 34.0–46.6)
Hemoglobin: 14.1 g/dL (ref 11.1–15.9)
MCH: 30.3 pg (ref 26.6–33.0)
MCHC: 32.6 g/dL (ref 31.5–35.7)
MCV: 93 fL (ref 79–97)
Platelets: 344 x10E3/uL (ref 150–450)
RBC: 4.66 x10E6/uL (ref 3.77–5.28)
RDW: 13.3 % (ref 11.7–15.4)
WBC: 9.5 x10E3/uL (ref 3.4–10.8)

## 2023-12-14 LAB — DIGOXIN LEVEL: Digoxin, Serum: 0.8 ng/mL (ref 0.5–0.9)

## 2024-01-16 ENCOUNTER — Ambulatory Visit: Admitting: Podiatry

## 2024-01-16 DIAGNOSIS — D689 Coagulation defect, unspecified: Secondary | ICD-10-CM

## 2024-01-16 DIAGNOSIS — L84 Corns and callosities: Secondary | ICD-10-CM

## 2024-01-19 NOTE — Progress Notes (Signed)
 Subjective:   Patient ID: Melissa Randolph, female   DOB: 89 y.o.   MRN: [id number]   HPI Patient presents with very painful corn fifth digit right foot that has come back again and hard to wear shoe gear with comfortably.  Patient is on blood thinner   ROS      Objective:  Physical Exam  Neurovascular status found to be intact keratotic lesion digit 5 right with thickness with patient who is at risk with blood     Assessment:  Digital deformity digit 5 right keratotic lesion formation pain     Plan:  H&P reviewed debridement of painful lesion with patient high risk due to blood thinner no iatrogenic bleeding reappoint 2 weeks

## 2024-02-12 ENCOUNTER — Other Ambulatory Visit: Payer: Self-pay | Admitting: General Practice
